# Patient Record
Sex: Female | Born: 1950
Health system: Southern US, Community
[De-identification: ages and names within clinical notes are randomized; demographics above are authoritative.]

## PROBLEM LIST (undated history)

## (undated) DIAGNOSIS — K7400 Hepatic fibrosis, unspecified: Secondary | ICD-10-CM

## (undated) DIAGNOSIS — H269 Unspecified cataract: Secondary | ICD-10-CM

## (undated) DIAGNOSIS — F419 Anxiety disorder, unspecified: Secondary | ICD-10-CM

## (undated) DIAGNOSIS — T7840XA Allergy, unspecified, initial encounter: Secondary | ICD-10-CM

## (undated) DIAGNOSIS — E785 Hyperlipidemia, unspecified: Secondary | ICD-10-CM

## (undated) DIAGNOSIS — M8580 Other specified disorders of bone density and structure, unspecified site: Secondary | ICD-10-CM

## (undated) DIAGNOSIS — I471 Supraventricular tachycardia, unspecified: Secondary | ICD-10-CM

## (undated) DIAGNOSIS — M1990 Unspecified osteoarthritis, unspecified site: Secondary | ICD-10-CM

## (undated) DIAGNOSIS — K219 Gastro-esophageal reflux disease without esophagitis: Secondary | ICD-10-CM

## (undated) DIAGNOSIS — C4491 Basal cell carcinoma of skin, unspecified: Secondary | ICD-10-CM

## (undated) DIAGNOSIS — L719 Rosacea, unspecified: Secondary | ICD-10-CM

## (undated) DIAGNOSIS — Z8619 Personal history of other infectious and parasitic diseases: Secondary | ICD-10-CM

## (undated) DIAGNOSIS — E079 Disorder of thyroid, unspecified: Secondary | ICD-10-CM

## (undated) DIAGNOSIS — R748 Abnormal levels of other serum enzymes: Secondary | ICD-10-CM

## (undated) HISTORY — DX: Unspecified cataract: H26.9

## (undated) HISTORY — DX: Supraventricular tachycardia: I47.1

## (undated) HISTORY — DX: Other specified disorders of bone density and structure, unspecified site: M85.80

## (undated) HISTORY — PX: WISDOM TOOTH EXTRACTION: SHX21

## (undated) HISTORY — DX: Supraventricular tachycardia, unspecified: I47.10

## (undated) HISTORY — DX: Unspecified osteoarthritis, unspecified site: M19.90

## (undated) HISTORY — PX: TUBAL LIGATION: SHX77

## (undated) HISTORY — DX: Disorder of thyroid, unspecified: E07.9

## (undated) HISTORY — DX: Hyperlipidemia, unspecified: E78.5

## (undated) HISTORY — DX: Personal history of other infectious and parasitic diseases: Z86.19

## (undated) HISTORY — PX: OTHER SURGICAL HISTORY: SHX169

## (undated) HISTORY — DX: Rosacea, unspecified: L71.9

## (undated) HISTORY — DX: Allergy, unspecified, initial encounter: T78.40XA

## (undated) HISTORY — PX: COLONOSCOPY: SHX174

## (undated) HISTORY — DX: Gastro-esophageal reflux disease without esophagitis: K21.9

## (undated) HISTORY — DX: Hepatic fibrosis, unspecified: K74.00

## (undated) HISTORY — DX: Abnormal levels of other serum enzymes: R74.8

---
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
## 1998-04-17 ENCOUNTER — Other Ambulatory Visit: Admission: RE | Admit: 1998-04-17 | Discharge: 1998-04-17 | Payer: Self-pay | Admitting: Obstetrics & Gynecology

## 2000-04-30 ENCOUNTER — Other Ambulatory Visit: Admission: RE | Admit: 2000-04-30 | Discharge: 2000-04-30 | Payer: Self-pay | Admitting: Obstetrics and Gynecology

## 2001-04-30 ENCOUNTER — Other Ambulatory Visit: Admission: RE | Admit: 2001-04-30 | Discharge: 2001-04-30 | Payer: Self-pay | Admitting: Obstetrics and Gynecology

## 2003-06-26 ENCOUNTER — Encounter: Admission: RE | Admit: 2003-06-26 | Discharge: 2003-07-13 | Payer: Self-pay | Admitting: Orthopedic Surgery

## 2005-08-17 ENCOUNTER — Encounter: Admission: RE | Admit: 2005-08-17 | Discharge: 2005-08-17 | Payer: Self-pay | Admitting: Neurology

## 2011-06-27 ENCOUNTER — Encounter: Payer: Self-pay | Admitting: Gastroenterology

## 2011-07-07 ENCOUNTER — Ambulatory Visit (AMBULATORY_SURGERY_CENTER): Payer: BC Managed Care – PPO | Admitting: *Deleted

## 2011-07-07 ENCOUNTER — Encounter: Payer: Self-pay | Admitting: Gastroenterology

## 2011-07-07 VITALS — Ht 66.0 in | Wt 134.7 lb

## 2011-07-07 DIAGNOSIS — Z1211 Encounter for screening for malignant neoplasm of colon: Secondary | ICD-10-CM

## 2011-07-07 MED ORDER — PEG-KCL-NACL-NASULF-NA ASC-C 100 G PO SOLR: 1.0000 | Freq: Once | ORAL | Status: DC

## 2011-07-21 ENCOUNTER — Ambulatory Visit (AMBULATORY_SURGERY_CENTER): Payer: BC Managed Care – PPO | Admitting: Gastroenterology

## 2011-07-21 ENCOUNTER — Encounter: Payer: Self-pay | Admitting: Gastroenterology

## 2011-07-21 VITALS — BP 108/70 | HR 62 | Temp 97.7°F | Resp 18 | Ht 66.0 in | Wt 134.0 lb

## 2011-07-21 DIAGNOSIS — D126 Benign neoplasm of colon, unspecified: Secondary | ICD-10-CM

## 2011-07-21 DIAGNOSIS — Z1211 Encounter for screening for malignant neoplasm of colon: Secondary | ICD-10-CM

## 2011-07-21 MED ORDER — SODIUM CHLORIDE 0.9 % IV SOLN: 500.0000 mL | INTRAVENOUS | Status: DC

## 2011-07-21 NOTE — Progress Notes (Signed)
Patient states she forgot to tell Preadmission nurse that she has had left lower quadrant pain x 2 months. Patient stating she went to GYN and the physician said it was not he ovaries as the patient had thought. Patient reports a history of this type of pain once before 2 years ago x 2 months. Patient states more like a "tiny little contraction." and it has awoken her before.Duration usually 2 minutes to 2 hours.  Patient requested IV site be anti cubital.

## 2011-07-21 NOTE — Progress Notes (Signed)
Difficulity reaching the cecum.  Pt turned to her back and abdominal pressure by the tech.  Medications were titrated per MD's orders.  Cecum was reached.  Pt had periods that she grimaced but not very uncomfortable while the scope was advanced.  She slept on the way out comfortably. Maw  Hung 2nd ba of 0.9% NS at 10:56. Lenice Llamas

## 2011-07-21 NOTE — Patient Instructions (Signed)
PLEASE FOLLOW DISCHARGE INSTRUCTIONS GIVEN TODAY. RESUME CURRENT MEDICATIONS TODAY. COLON POLYP X1 REMOVED AND SENT TO LAB. YOU WILL RECEIVE RESULT LETTER IN YOUR MAIL IN 1-2 WEEKS. CALL us WITH ANY QUESTIONS OR CONCERNS. THANK YOU!  Polyps, Colon  A polyp is extra tissue that grows inside your body. Colon polyps grow in the large intestine. The large intestine, also called the colon, is part of your digestive system. It is a long, hollow tube at the end of your digestive tract where your body makes and stores stool. Most polyps are not dangerous. They are benign. This means they are not cancerous. But over time, some types of polyps can turn into cancer. Polyps that are smaller than a pea are usually not harmful. But larger polyps could someday become or may already be cancerous. To be safe, doctors remove all polyps and test them.  WHO GETS POLYPS? Anyone can get polyps, but certain people are more likely than others. You may have a greater chance of getting polyps if:  You are over 50.   You have had polyps before.   Someone in your family has had polyps.   Someone in your family has had cancer of the large intestine.   Find out if someone in your family has had polyps. You may also be more likely to get polyps if you:   Eat a lot of fatty foods   Smoke   Drink alcohol   Do not exercise  Eat too much  SYMPTOMS Most small polyps do not cause symptoms. People often do not know they have one until their caregiver finds it during a regular checkup or while testing them for something else. Some people do have symptoms like these:  Bleeding from the anus. You might notice blood on your underwear or on toilet paper after you have had a bowel movement.   Constipation or diarrhea that lasts more than a week.   Blood in the stool. Blood can make stool look black or it can show up as red streaks in the stool.  If you have any of these symptoms, see your caregiver. HOW DOES THE DOCTOR TEST  FOR POLYPS? The doctor can use four tests to check for polyps:  Digital rectal exam. The caregiver wears gloves and checks your rectum (the last part of the large intestine) to see if it feels normal. This test would find polyps only in the rectum. Your caregiver may need to do one of the other tests listed below to find polyps higher up in the intestine.   Barium enema. The caregiver puts a liquid called barium into your rectum before taking x-rays of your large intestine. Barium makes your intestine look white in the pictures. Polyps are dark, so they are easy to see.   Sigmoidoscopy. With this test, the caregiver can see inside your large intestine. A thin flexible tube is placed into your rectum. The device is called a sigmoidoscope, which has a light and a tiny video camera in it. The caregiver uses the sigmoidoscope to look at the last third of your large intestine.   Colonoscopy. This test is like sigmoidoscopy, but the caregiver looks at all of the large intestine. It usually requires sedation. This is the most common method for finding and removing polyps.  TREATMENT  The caregiver will remove the polyp during sigmoidoscopy or colonoscopy. The polyp is then tested for cancer.   If you have had polyps, your caregiver may want you to get tested regularly  in the future.  PREVENTION There is not one sure way to prevent polyps. You might be able to lower your risk of getting them if you:  Eat more fruits and vegetables and less fatty food.   Do not smoke.   Avoid alcohol.   Exercise every day.   Lose weight if you are overweight.   Eating more calcium and folate can also lower your risk of getting polyps. Some foods that are rich in calcium are milk, cheese, and broccoli. Some foods that are rich in folate are chickpeas, kidney beans, and spinach.   Aspirin might help prevent polyps. Studies are under way.  Document Released: 06/25/2004 Document Re-Released: 03/19/2010 [facility]  Patient Information 2011 [place], [place].

## 2011-07-22 ENCOUNTER — Telehealth: Payer: Self-pay | Admitting: *Deleted

## 2011-07-22 NOTE — Telephone Encounter (Signed)
Message left for patient

## 2013-01-18 ENCOUNTER — Telehealth: Payer: Self-pay | Admitting: Nurse Practitioner

## 2013-01-19 NOTE — Telephone Encounter (Signed)
Spoke with patient needs to be seen middle of may

## 2013-02-03 ENCOUNTER — Ambulatory Visit (INDEPENDENT_AMBULATORY_CARE_PROVIDER_SITE_OTHER): Payer: BC Managed Care – PPO | Admitting: Nurse Practitioner

## 2013-02-03 VITALS — BP 128/70 | HR 76 | Temp 97.4°F | Ht 66.0 in | Wt 143.0 lb

## 2013-02-03 DIAGNOSIS — L719 Rosacea, unspecified: Secondary | ICD-10-CM

## 2013-02-03 DIAGNOSIS — E039 Hypothyroidism, unspecified: Secondary | ICD-10-CM

## 2013-02-03 DIAGNOSIS — M899 Disorder of bone, unspecified: Secondary | ICD-10-CM

## 2013-02-03 DIAGNOSIS — E785 Hyperlipidemia, unspecified: Secondary | ICD-10-CM

## 2013-02-03 DIAGNOSIS — J309 Allergic rhinitis, unspecified: Secondary | ICD-10-CM

## 2013-02-03 DIAGNOSIS — B009 Herpesviral infection, unspecified: Secondary | ICD-10-CM

## 2013-02-03 DIAGNOSIS — M8580 Other specified disorders of bone density and structure, unspecified site: Secondary | ICD-10-CM | POA: Insufficient documentation

## 2013-02-03 DIAGNOSIS — L989 Disorder of the skin and subcutaneous tissue, unspecified: Secondary | ICD-10-CM

## 2013-02-03 DIAGNOSIS — M8589 Other specified disorders of bone density and structure, multiple sites: Secondary | ICD-10-CM | POA: Insufficient documentation

## 2013-02-03 MED ORDER — METHYLPREDNISOLONE ACETATE 80 MG/ML IJ SUSP
80.0000 mg | Freq: Once | INTRAMUSCULAR | Status: AC
  Administered 2013-02-03: 80 mg via INTRAMUSCULAR

## 2013-02-03 NOTE — Patient Instructions (Signed)
Allergic Rhinitis  Allergic rhinitis is when the mucous membranes in the nose respond to allergens. Allergens are particles in the air that cause your body to have an allergic reaction. This causes you to release allergic antibodies. Through a chain of events, these eventually cause you to release histamine into the blood stream (hence the use of antihistamines). Although meant to be protective to the body, it is this release that causes your discomfort, such as frequent sneezing, congestion and an itchy runny nose.    CAUSES    The pollen allergens may come from grasses, trees, and weeds. This is seasonal allergic rhinitis, or "hay fever." Other allergens cause year-round allergic rhinitis (perennial allergic rhinitis) such as house dust mite allergen, pet dander and mold spores.    SYMPTOMS     Nasal stuffiness (congestion).   Runny, itchy nose with sneezing and tearing of the eyes.   There is often an itching of the mouth, eyes and ears.  It cannot be cured, but it can be controlled with medications.  DIAGNOSIS    If you are unable to determine the offending allergen, skin or blood testing may find it.  TREATMENT     Avoid the allergen.   Medications and allergy shots (immunotherapy) can help.   Hay fever may often be treated with antihistamines in pill or nasal spray forms. Antihistamines block the effects of histamine. There are over-the-counter medicines that may help with nasal congestion and swelling around the eyes. Check with your caregiver before taking or giving this medicine.  If the treatment above does not work, there are many new medications your caregiver can prescribe. Stronger medications may be used if initial measures are ineffective. Desensitizing injections can be used if medications and avoidance fails. Desensitization is when a patient is given ongoing shots until the body becomes less sensitive to the allergen. Make sure you follow up with your caregiver if problems continue.   SEEK MEDICAL CARE IF:     You develop fever (more than 100.5 F (38.1 C).   You develop a cough that does not stop easily (persistent).   You have shortness of breath.   You start wheezing.   Symptoms interfere with normal daily activities.  Document Released: 06/24/2001 Document Revised: 12/22/2011 Document Reviewed: 01/03/2009  [employer] Patient Information 2013 [employer].

## 2013-02-03 NOTE — Progress Notes (Signed)
  Subjective:    Patient ID: [name] [name], female    DOB: 1951-09-15, 62 y.o.   MRN: [id number]  Hyperlipidemia This is a chronic problem. The current episode started more than 1 year ago. The problem is controlled. Recent lipid tests were reviewed and are normal. She has no history of diabetes. There are no known factors aggravating her hyperlipidemia. Pertinent negatives include no focal sensory loss, focal weakness, leg pain, myalgias or shortness of breath. Current antihyperlipidemic treatment includes statins. The current treatment provides significant improvement of lipids. Risk factors for coronary artery disease include post-menopausal.  Hypothyroidism Chronic problem. Patient doing well. No c/o fatigue.  -Patient in complaining of Congestion and PND . Started 6day. Has gotten worse since started. Associated symptoms include Fatigue, cough and headache. He has tried mucinex d OTC without relief. - Skin lesion right forearm- popped up 1 week ago. Has increased in size    Review of Systems  Constitutional: Positive for fever. Negative for chills and appetite change.  HENT: Positive for congestion, rhinorrhea, postnasal drip and sinus pressure. Negative for ear pain.   Eyes: Negative.   Respiratory: Positive for cough (nonproductive). Negative for shortness of breath.   Cardiovascular: Negative.   Musculoskeletal: Negative for myalgias.  Neurological: Negative for focal weakness.       Objective:   Physical Exam  Constitutional: She is oriented to person, place, and time. She appears well-developed and well-nourished.  HENT:  Right Ear: Hearing, tympanic membrane, external ear and ear canal normal.  Left Ear: Hearing, tympanic membrane, external ear and ear canal normal.  Nose: Mucosal edema and rhinorrhea present. Right sinus exhibits frontal sinus tenderness (mild). Right sinus exhibits no maxillary sinus tenderness. Left sinus exhibits frontal sinus tenderness (mild). Left sinus  exhibits no maxillary sinus tenderness.  Mouth/Throat: Posterior oropharyngeal erythema present.  Eyes: Pupils are equal, round, and reactive to light.  Neck: Normal range of motion. Neck supple. No JVD present. Carotid bruit is not present.  Cardiovascular: Normal rate, normal heart sounds and intact distal pulses.   Pulmonary/Chest: Effort normal and breath sounds normal.  Abdominal: Soft. Bowel sounds are normal. She exhibits no mass. There is no tenderness. There is no rebound and no guarding.  Neurological: She is alert and oriented to person, place, and time.  Skin: Skin is warm and dry.  2cm annular lesion, erythematous and scaly right forearm.  Psychiatric: She has a normal mood and affect. Her behavior is normal. Judgment and thought content normal.   BP 128/70  Pulse 76  Temp(Src) 97.4 F (36.3 C) (Oral)  Ht 5\' 6"  (1.676 m)  Wt 143 lb (64.864 kg)  BMI 23.09 kg/m2        Assessment & Plan:  1. Allergic rhinitis Nasonex 2 sprays each nostril QD-sample Norel AD 1 po Q6 prn-samples - methylPREDNISolone acetate (DEPO-MEDROL) injection 80 mg; Inject 1 mL (80 mg total) into the muscle once.  2. Unspecified hypothyroidism  - Thyroid Panel With TSH  3. Hyperlipidemia Low fat diet and exercise - COMPLETE METABOLIC PANEL WITH GFR - NMR Lipoprofile with Lipids   4. Benign skin lesion of forearm Do not pick at lesion - Ambulatory referral to Dermatology Mary-Margaret Daphine Deutscher, FNP

## 2013-02-04 ENCOUNTER — Other Ambulatory Visit: Payer: Self-pay | Admitting: Nurse Practitioner

## 2013-02-04 LAB — NMR LIPOPROFILE WITH LIPIDS
Cholesterol, Total: 113 mg/dL (ref <200)
HDL Particle Number: 29.6 umol/L — ABNORMAL LOW (ref >=30.5)
HDL Size: 8.5 nm — ABNORMAL LOW (ref >=9.2)
HDL-C: 40 mg/dL (ref >=40)
LDL (calc): 54 mg/dL (ref <100)
LDL Particle Number: 837 nmol/L (ref <1000)
LDL Size: 20.3 nm — ABNORMAL LOW (ref >20.5)
LP-IR Score: 56 — ABNORMAL HIGH (ref <=45)
Large HDL-P: <1.3 umol/L — ABNORMAL LOW (ref >=4.8)
Large VLDL-P: 0.8 nmol/L (ref <=2.7)
Small LDL Particle Number: 444 nmol/L (ref <=527)
Triglycerides: 94 mg/dL (ref <150)
VLDL Size: 46.8 nm — ABNORMAL HIGH (ref <=46.6)

## 2013-02-04 LAB — COMPLETE METABOLIC PANEL WITH GFR
ALT: 21 U/L (ref 0–35)
AST: 20 U/L (ref 0–37)
Albumin: 3.9 g/dL (ref 3.5–5.2)
Alkaline Phosphatase: 68 U/L (ref 39–117)
BUN: 14 mg/dL (ref 6–23)
CO2: 28 mEq/L (ref 19–32)
Calcium: 9.2 mg/dL (ref 8.4–10.5)
Chloride: 102 mEq/L (ref 96–112)
Creat: 0.64 mg/dL (ref 0.50–1.10)
GFR, Est African American: >89 mL/min
GFR, Est Non African American: >89 mL/min
Glucose, Bld: 92 mg/dL (ref 70–99)
Potassium: 4.5 mEq/L (ref 3.5–5.3)
Sodium: 138 mEq/L (ref 135–145)
Total Bilirubin: 0.5 mg/dL (ref 0.3–1.2)
Total Protein: 6.6 g/dL (ref 6.0–8.3)

## 2013-02-04 LAB — THYROID PANEL WITH TSH
Free Thyroxine Index: 5.1 — ABNORMAL HIGH (ref 1.0–3.9)
T3 Uptake: 34.3 % (ref 22.5–37.0)
T4, Total: 14.8 ug/dL — ABNORMAL HIGH (ref 5.0–12.5)
TSH: 0.166 u[IU]/mL — ABNORMAL LOW (ref 0.350–4.500)

## 2013-02-04 MED ORDER — LEVOTHYROXINE SODIUM 50 MCG PO TABS: 50.0000 ug | ORAL_TABLET | Freq: Every day | ORAL | Status: DC

## 2013-02-07 ENCOUNTER — Telehealth: Payer: Self-pay | Admitting: Nurse Practitioner

## 2013-02-07 NOTE — Telephone Encounter (Signed)
Patient aware.

## 2013-03-01 ENCOUNTER — Other Ambulatory Visit: Payer: Self-pay

## 2013-03-01 ENCOUNTER — Ambulatory Visit: Payer: Self-pay | Admitting: Nurse Practitioner

## 2013-03-21 ENCOUNTER — Other Ambulatory Visit (INDEPENDENT_AMBULATORY_CARE_PROVIDER_SITE_OTHER): Payer: BC Managed Care – PPO

## 2013-03-21 DIAGNOSIS — E039 Hypothyroidism, unspecified: Secondary | ICD-10-CM

## 2013-03-21 LAB — THYROID PANEL WITH TSH
Free Thyroxine Index: 2.8 (ref 1.0–3.9)
T3 Uptake: 30.1 % (ref 22.5–37.0)
TSH: 12.966 u[IU]/mL — ABNORMAL HIGH (ref 0.350–4.500)

## 2013-03-22 ENCOUNTER — Other Ambulatory Visit: Payer: Self-pay | Admitting: Nurse Practitioner

## 2013-03-22 ENCOUNTER — Telehealth: Payer: Self-pay | Admitting: Nurse Practitioner

## 2013-03-22 DIAGNOSIS — E039 Hypothyroidism, unspecified: Secondary | ICD-10-CM

## 2013-03-22 MED ORDER — LEVOTHYROXINE SODIUM 88 MCG PO TABS: 88.0000 ug | ORAL_TABLET | Freq: Every day | ORAL | Status: DC

## 2013-03-22 NOTE — Progress Notes (Signed)
Pt aware of labs  

## 2013-03-22 NOTE — Telephone Encounter (Signed)
Pt aware of labs  

## 2013-03-24 ENCOUNTER — Other Ambulatory Visit: Payer: Self-pay | Admitting: Nurse Practitioner

## 2013-05-05 ENCOUNTER — Telehealth: Payer: Self-pay | Admitting: *Deleted

## 2013-05-05 ENCOUNTER — Other Ambulatory Visit (INDEPENDENT_AMBULATORY_CARE_PROVIDER_SITE_OTHER): Payer: BC Managed Care – PPO

## 2013-05-05 DIAGNOSIS — E039 Hypothyroidism, unspecified: Secondary | ICD-10-CM

## 2013-05-05 NOTE — Telephone Encounter (Signed)
Pt came by today and is concerned about her thyroid. She says that [employer] can now get levoxyl - but it will need to be ordered. She had labs today for rck thyroid. When we know what strength we need to do, please [facility] and d/c the levothyroxine! On 88 as of today. She liked the levoxyl better and it seemed to work better!

## 2013-05-06 LAB — THYROID PANEL WITH TSH
Free Thyroxine Index: 5.6 — ABNORMAL HIGH (ref 1.0–3.9)
T4, Total: 16.1 ug/dL — ABNORMAL HIGH (ref 5.0–12.5)
TSH: 0.396 u[IU]/mL (ref 0.350–4.500)

## 2013-05-08 ENCOUNTER — Other Ambulatory Visit: Payer: Self-pay | Admitting: Nurse Practitioner

## 2013-05-08 MED ORDER — LEVOTHYROXINE SODIUM 88 MCG PO TABS: 88.0000 ug | ORAL_TABLET | Freq: Every day | ORAL | Status: DC

## 2013-05-08 NOTE — Telephone Encounter (Signed)
Ok levothyroxine to levoxyl

## 2013-05-09 ENCOUNTER — Other Ambulatory Visit: Payer: Self-pay

## 2013-05-09 NOTE — Telephone Encounter (Signed)
Left message on machine that meds sent to pharmacy 

## 2013-05-27 ENCOUNTER — Ambulatory Visit (INDEPENDENT_AMBULATORY_CARE_PROVIDER_SITE_OTHER): Payer: BC Managed Care – PPO | Admitting: Nurse Practitioner

## 2013-05-27 ENCOUNTER — Encounter: Payer: Self-pay | Admitting: Nurse Practitioner

## 2013-05-27 VITALS — BP 116/75 | HR 87 | Temp 98.5°F | Ht 65.0 in | Wt 140.5 lb

## 2013-05-27 DIAGNOSIS — E039 Hypothyroidism, unspecified: Secondary | ICD-10-CM

## 2013-05-27 DIAGNOSIS — E785 Hyperlipidemia, unspecified: Secondary | ICD-10-CM

## 2013-05-27 DIAGNOSIS — L293 Anogenital pruritus, unspecified: Secondary | ICD-10-CM

## 2013-05-27 DIAGNOSIS — N898 Other specified noninflammatory disorders of vagina: Secondary | ICD-10-CM

## 2013-05-27 MED ORDER — TRIAMCINOLONE 0.1 % CREAM:EUCERIN CREAM 1:1: 1.0000 "application " | TOPICAL_CREAM | Freq: Two times a day (BID) | CUTANEOUS | Status: DC | PRN

## 2013-05-27 MED ORDER — ATORVASTATIN CALCIUM 40 MG PO TABS: 40.0000 mg | ORAL_TABLET | Freq: Every day | ORAL | Status: DC

## 2013-05-27 MED ORDER — LEVOTHYROXINE SODIUM 88 MCG PO TABS: 88.0000 ug | ORAL_TABLET | Freq: Every day | ORAL | Status: DC

## 2013-05-27 MED ORDER — LEVOXYL 88 MCG PO TABS: 88.0000 ug | ORAL_TABLET | Freq: Every day | ORAL | Status: DC

## 2013-05-27 NOTE — Patient Instructions (Signed)
Herpes Simplex Herpes simplex is generally classified as Type 1 or Type 2. Type 1 is generally the type that is responsible for cold sores. Type 2 is generally associated with sexually transmitted diseases. We now know that most of the thoughts on these viruses are inaccurate. We find that HSV1 is also present genitally and HSV2 can be present orally, but this will vary in different locations of the world. Herpes simplex is usually detected by doing a culture. Blood tests are also available for this virus; however, the accuracy is often not as good.  PREPARATION FOR TEST No preparation or fasting is necessary. NORMAL FINDINGS  No virus present  No HSV antigens or antibodies present Ranges for normal findings may vary among different laboratories and hospitals. You should always check with your doctor after having lab work or other tests done to discuss the meaning of your test results and whether your values are considered within normal limits. MEANING OF TEST  Your caregiver will go over the test results with you and discuss the importance and meaning of your results, as well as treatment options and the need for additional tests if necessary. OBTAINING THE TEST RESULTS  It is your responsibility to obtain your test results. Ask the lab or department performing the test when and how you will get your results. Document Released: 11/01/2004 Document Revised: 12/22/2011 Document Reviewed: 09/09/2008 [employer] Patient Information 2014 [employer].  

## 2013-05-27 NOTE — Progress Notes (Signed)
Subjective:    Patient ID: [name] [name], female    DOB: 1951/10/11, 62 y.o.   MRN: [id number]  Vaginal Itching The patient's primary symptoms include genital lesions, a genital odor, a genital rash and pelvic pain. The patient's pertinent negatives include no genital itching. This is a recurrent problem. The current episode started more than 1 year ago. The problem occurs intermittently. The problem has been waxing and waning. The pain is mild. The problem affects both sides. She is not pregnant. Pertinent negatives include no constipation or diarrhea. There has been no bleeding. She has not been passing clots. She has not been passing tissue. Nothing aggravates the symptoms. Treatments tried: acyclovir  The treatment provided no relief. She is sexually active. No, her partner does not have an STD. She uses nothing for contraception. She is postmenopausal.  Thyroid Problem Visit type: hypothyroidism. Patient reports no anxiety, constipation, depressed mood, diaphoresis, diarrhea, dry skin, hair loss, heat intolerance, hoarse voice, menstrual problem, nail problem, palpitations, tremors, visual change, weight gain or weight loss. The symptoms have been stable. Her past medical history is significant for hyperlipidemia. There is no history of diabetes.  Hyperlipidemia This is a chronic problem. The current episode started more than 1 year ago. The problem is controlled. Recent lipid tests were reviewed and are normal. Exacerbating diseases include hypothyroidism. She has no history of diabetes, obesity or nephrotic syndrome. There are no known factors aggravating her hyperlipidemia. Pertinent negatives include no focal sensory loss, focal weakness, leg pain, myalgias or shortness of breath. Current antihyperlipidemic treatment includes statins. The current treatment provides moderate improvement of lipids. Risk factors for coronary artery disease include family history.      Review of Systems   Constitutional: Negative for weight loss, weight gain and diaphoresis.  HENT: Negative for hoarse voice.   Respiratory: Negative for shortness of breath.   Cardiovascular: Negative for palpitations.  Gastrointestinal: Negative for diarrhea and constipation.  Endocrine: Negative for heat intolerance.  Genitourinary: Positive for pelvic pain. Negative for menstrual problem.  Musculoskeletal: Negative for myalgias.  Neurological: Negative for tremors and focal weakness.  All other systems reviewed and are negative.       Objective:   Physical Exam  Constitutional: She is oriented to person, place, and time. She appears well-developed and well-nourished.  HENT:  Nose: Nose normal.  Mouth/Throat: Oropharynx is clear and moist.  Eyes: EOM are normal.  Neck: Trachea normal, normal range of motion and full passive range of motion without pain. Neck supple. No JVD present. Carotid bruit is not present. No thyromegaly present.  Cardiovascular: Normal rate, regular rhythm, normal heart sounds and intact distal pulses.  Exam reveals no gallop and no friction rub.   No murmur heard. Pulmonary/Chest: Effort normal and breath sounds normal.  Abdominal: Soft. Bowel sounds are normal. She exhibits no distension and no mass. There is no tenderness.  Musculoskeletal: Normal range of motion.  Lymphadenopathy:    She has no cervical adenopathy.  Neurological: She is alert and oriented to person, place, and time. She has normal reflexes.  Skin: Skin is warm and dry.  Psychiatric: She has a normal mood and affect. Her behavior is normal. Judgment and thought content normal.    BP 116/75  Pulse 87  Temp(Src) 98.5 F (36.9 C) (Oral)  Ht 5\' 5"  (1.651 m)  Wt 140 lb 8 oz (63.73 kg)  BMI 23.38 kg/m2       Assessment & Plan:  1. Hypothyroidism Changed from levothyroxine to levoxyl  per patient request -  (LEVOXYL) 88 MCG tablet; Take 1 tablet (88 mcg total) by mouth daily before breakfast.   Dispense: 30 tablet; Refill: 5 - Thyroid Panel With TSH  2. Vaginal itching Try to avoid itching Come to office when have episode of itching so I can look at area - Triamcinolone Acetonide (TRIAMCINOLONE 0.1 % CREAM : EUCERIN) CREA; Apply 1 application topically 2 (two) times daily as needed.  Dispense: 1 each; Refill: 2  3. Hyperlipidemia Low fat diet and exercise - atorvastatin (LIPITOR) 40 MG tablet; Take 1 tablet (40 mg total) by mouth daily.  Dispense: 30 tablet; Refill: 5 - CMP14+EGFR - NMR, lipoprofile  Health maintenance reviewed  Mary-Margaret Daphine Deutscher, FNP

## 2013-05-28 LAB — NMR, LIPOPROFILE
Cholesterol: 134 mg/dL (ref <200)
LDL Particle Number: 1160 nmol/L — ABNORMAL HIGH (ref <1000)
LDL Size: 20.4 nm — ABNORMAL LOW (ref >20.5)
LP-IR Score: 52 — ABNORMAL HIGH (ref <=45)
Small LDL Particle Number: 678 nmol/L — ABNORMAL HIGH (ref <=527)
Triglycerides by NMR: 118 mg/dL (ref <150)

## 2013-05-28 LAB — CMP14+EGFR
ALT: 27 IU/L (ref 0–32)
AST: 24 IU/L (ref 0–40)
Albumin/Globulin Ratio: 2 (ref 1.1–2.5)
Albumin: 4.4 g/dL (ref 3.5–5.5)
Alkaline Phosphatase: 92 IU/L (ref 39–117)
BUN/Creatinine Ratio: 15 (ref 9–23)
BUN: 13 mg/dL (ref 6–24)
Chloride: 102 mmol/L (ref 97–108)
Creatinine, Ser: 0.84 mg/dL (ref 0.57–1.00)
GFR calc Af Amer: 88 mL/min/{1.73_m2} (ref >59)
GFR calc non Af Amer: 76 mL/min/{1.73_m2} (ref >59)
Glucose: 88 mg/dL (ref 65–99)
Sodium: 139 mmol/L (ref 134–144)
Total Bilirubin: 0.6 mg/dL (ref 0.0–1.2)
Total Protein: 6.6 g/dL (ref 6.0–8.5)

## 2013-05-28 LAB — THYROID PANEL WITH TSH
Free Thyroxine Index: 4.2 (ref 1.2–4.9)
T3 Uptake Ratio: 30 % (ref 24–39)
T4, Total: 14 ug/dL — ABNORMAL HIGH (ref 4.5–12.0)

## 2013-05-31 ENCOUNTER — Telehealth: Payer: Self-pay | Admitting: Nurse Practitioner

## 2013-05-31 NOTE — Telephone Encounter (Signed)
Patient notified of lab results. See lab note

## 2013-08-18 ENCOUNTER — Other Ambulatory Visit: Payer: Self-pay

## 2013-08-30 ENCOUNTER — Ambulatory Visit (INDEPENDENT_AMBULATORY_CARE_PROVIDER_SITE_OTHER): Payer: BC Managed Care – PPO | Admitting: General Practice

## 2013-08-30 ENCOUNTER — Other Ambulatory Visit: Payer: BC Managed Care – PPO

## 2013-08-30 VITALS — BP 110/70 | HR 96 | Temp 98.2°F | Ht 65.0 in | Wt 136.0 lb

## 2013-08-30 DIAGNOSIS — E039 Hypothyroidism, unspecified: Secondary | ICD-10-CM

## 2013-08-30 DIAGNOSIS — J069 Acute upper respiratory infection, unspecified: Secondary | ICD-10-CM

## 2013-08-30 DIAGNOSIS — R5381 Other malaise: Secondary | ICD-10-CM

## 2013-08-30 DIAGNOSIS — E785 Hyperlipidemia, unspecified: Secondary | ICD-10-CM

## 2013-08-30 DIAGNOSIS — J029 Acute pharyngitis, unspecified: Secondary | ICD-10-CM

## 2013-08-30 DIAGNOSIS — E559 Vitamin D deficiency, unspecified: Secondary | ICD-10-CM

## 2013-08-30 LAB — POCT CBC
Granulocyte percent: 77.3 %G (ref 37–80)
HCT, POC: 41 % (ref 37.7–47.9)
Hemoglobin: 13.6 g/dL (ref 12.2–16.2)
Lymph, poc: 1.5 (ref 0.6–3.4)
MCHC: 33.1 g/dL (ref 31.8–35.4)
MCV: 84.3 fL (ref 80–97)
POC Granulocyte: 7.6 — AB (ref 2–6.9)
POC LYMPH PERCENT: 15.4 %L (ref 10–50)
RBC: 4.9 M/uL (ref 4.04–5.48)
RDW, POC: 12.1 %
WBC: 9.8 10*3/uL (ref 4.6–10.2)

## 2013-08-30 LAB — POCT RAPID STREP A (OFFICE): Rapid Strep A Screen: NEGATIVE

## 2013-08-30 MED ORDER — AMOXICILLIN 500 MG PO CAPS: 500.0000 mg | ORAL_CAPSULE | Freq: Two times a day (BID) | ORAL | Status: DC

## 2013-08-30 NOTE — Patient Instructions (Signed)

## 2013-08-30 NOTE — Progress Notes (Signed)
  Subjective:    Patient ID: [name] [name], female    DOB: 18-Sep-1951, 62 y.o.   MRN: [id number]  Sore Throat  This is a new problem. The current episode started in the past 7 days. The problem has been unchanged. Neither side of throat is experiencing more pain than the other. There has been no fever. The pain is at a severity of 2/10. The pain is mild. Associated symptoms include congestion, coughing and a plugged ear sensation. Pertinent negatives include no abdominal pain, diarrhea, ear pain, headaches, shortness of breath or vomiting. She has had no exposure to strep or mono. She has tried NSAIDs for the symptoms. The treatment provided no relief.  Cough This is a new problem. The current episode started in the past 7 days. The problem has been unchanged. The cough is productive of sputum (brown, greenish sputum). Associated symptoms include a sore throat. Pertinent negatives include no chest pain, chills, ear pain, fever, headaches, nasal congestion, postnasal drip, shortness of breath or wheezing. Nothing aggravates the symptoms. She has tried nothing for the symptoms. There is no history of asthma, bronchitis, emphysema or pneumonia.      Review of Systems  Constitutional: Negative for fever and chills.  HENT: Positive for congestion and sore throat. Negative for ear pain, postnasal drip and sinus pressure.   Respiratory: Positive for cough. Negative for shortness of breath and wheezing.   Cardiovascular: Negative for chest pain and palpitations.  Gastrointestinal: Negative for vomiting, abdominal pain and diarrhea.  Neurological: Negative for dizziness, weakness and headaches.       Objective:   Physical Exam  Constitutional: She is oriented to person, place, and time. She appears well-developed and well-nourished.  HENT:  Head: Normocephalic and atraumatic.  Right Ear: External ear normal.  Left Ear: External ear normal.  Eyes: EOM are normal. Pupils are equal, round, and  reactive to light.  Neck: Normal range of motion. Neck supple. No thyromegaly present.  Cardiovascular: Normal rate, regular rhythm and normal heart sounds.   Pulmonary/Chest: Effort normal and breath sounds normal.  Lymphadenopathy:    She has no cervical adenopathy.  Neurological: She is alert and oriented to person, place, and time.  Skin: Skin is warm and dry.  Psychiatric: She has a normal mood and affect.          Assessment & Plan:  1. Sore throat  - POCT rapid strep A  2. Upper respiratory infection  - amoxicillin (AMOXIL) 500 MG capsule; Take 1 capsule (500 mg total) by mouth 2 (two) times daily.  Dispense: 20 capsule; Refill: 0 -increased fluids -RTO if symptoms worsen or unresolved -Patient verbalized understanding -Coralie Keens, FNP-C

## 2013-09-01 ENCOUNTER — Other Ambulatory Visit: Payer: Self-pay | Admitting: Nurse Practitioner

## 2013-09-01 LAB — NMR, LIPOPROFILE
HDL Particle Number: 33.5 umol/L (ref >=30.5)
LDL Particle Number: 846 nmol/L (ref <1000)
LDL Size: 20.4 nm — ABNORMAL LOW (ref >20.5)
LP-IR Score: 47 — ABNORMAL HIGH (ref <=45)
Triglycerides by NMR: 97 mg/dL (ref <150)

## 2013-09-01 LAB — CMP14+EGFR
ALT: 25 IU/L (ref 0–32)
AST: 16 IU/L (ref 0–40)
Albumin/Globulin Ratio: 1.7 (ref 1.1–2.5)
Albumin: 4 g/dL (ref 3.5–5.5)
Alkaline Phosphatase: 91 IU/L (ref 39–117)
BUN/Creatinine Ratio: 20 (ref 9–23)
BUN: 16 mg/dL (ref 6–24)
Calcium: 9.6 mg/dL (ref 8.7–10.2)
Chloride: 100 mmol/L (ref 97–108)
Creatinine, Ser: 0.82 mg/dL (ref 0.57–1.00)
GFR calc Af Amer: 91 mL/min/{1.73_m2} (ref >59)
Potassium: 4.1 mmol/L (ref 3.5–5.2)
Total Bilirubin: 0.6 mg/dL (ref 0.0–1.2)

## 2013-09-01 LAB — THYROID PANEL WITH TSH
Free Thyroxine Index: 3.7 (ref 1.2–4.9)
T4, Total: 12.7 ug/dL — ABNORMAL HIGH (ref 4.5–12.0)

## 2013-09-01 LAB — VITAMIN D 25 HYDROXY (VIT D DEFICIENCY, FRACTURES): Vit D, 25-Hydroxy: 23.6 ng/mL — ABNORMAL LOW (ref 30.0–100.0)

## 2013-09-01 MED ORDER — LEVOTHYROXINE SODIUM 75 MCG PO TABS: 75.0000 ug | ORAL_TABLET | Freq: Every day | ORAL | Status: DC

## 2013-10-18 ENCOUNTER — Other Ambulatory Visit (INDEPENDENT_AMBULATORY_CARE_PROVIDER_SITE_OTHER): Payer: BC Managed Care – PPO

## 2013-10-18 DIAGNOSIS — E039 Hypothyroidism, unspecified: Secondary | ICD-10-CM

## 2013-10-19 LAB — THYROID PANEL WITH TSH
FREE THYROXINE INDEX: 2.9 (ref 1.2–4.9)
T3 Uptake Ratio: 28 % (ref 24–39)
T4 TOTAL: 10.4 ug/dL (ref 4.5–12.0)
TSH: 1.21 u[IU]/mL (ref 0.450–4.500)

## 2013-11-25 ENCOUNTER — Ambulatory Visit (INDEPENDENT_AMBULATORY_CARE_PROVIDER_SITE_OTHER): Payer: BC Managed Care – PPO | Admitting: Family Medicine

## 2013-11-25 VITALS — BP 99/68 | HR 91 | Temp 98.5°F | Ht 65.0 in | Wt 137.8 lb

## 2013-11-25 DIAGNOSIS — R509 Fever, unspecified: Secondary | ICD-10-CM | POA: Insufficient documentation

## 2013-11-25 DIAGNOSIS — J111 Influenza due to unidentified influenza virus with other respiratory manifestations: Secondary | ICD-10-CM | POA: Insufficient documentation

## 2013-11-25 LAB — POCT RAPID STREP A (OFFICE): Rapid Strep A Screen: NEGATIVE

## 2013-11-25 LAB — POCT INFLUENZA A/B
Influenza A, POC: POSITIVE
Influenza B, POC: NEGATIVE

## 2013-11-25 MED ORDER — OSELTAMIVIR PHOSPHATE 75 MG PO CAPS: 75.0000 mg | ORAL_CAPSULE | Freq: Two times a day (BID) | ORAL | Status: DC

## 2013-11-25 NOTE — Patient Instructions (Signed)

## 2013-11-25 NOTE — Progress Notes (Signed)
Patient ID: [name] [name], female   DOB: 1951/03/13, 63 y.o.   MRN: [id number] SUBJECTIVE: CC: Chief Complaint  Patient presents with  . Fever  . head and chest congestion  . Generalized Body Aches  . Sore Throat    HPI: As above. Sore Throat Patient complains of sore throat. Associated symptoms include chills, coryza, myalgias, nasal blockage, post nasal drip, sinus and nasal congestion and sore throat. Onset of symptoms was 3 days ago, and have been gradually worsening since that time. She is drinking plenty of fluids. She has not had recent close exposure to someone with proven streptococcal pharyngitis. Nor the flu.  Past Medical History  Diagnosis Date  . Allergy   . Hyperlipidemia   . Osteopenia   . Thyroid disease     hypothyroid  . History of cold sores   . Rosacea    Past Surgical History  Procedure Laterality Date  . Wisdom tooth extraction    . Tubal ligation     History   Social History  . Marital Status: Single    Spouse Name: N/A    Number of Children: N/A  . Years of Education: N/A   Occupational History  . Not on file.   Social History Main Topics  . Smoking status: Never Smoker   . Smokeless tobacco: Not on file  . Alcohol Use: No  . Drug Use: No  . Sexual Activity: Not on file   Other Topics Concern  . Not on file   Social History Narrative  . No narrative on file   Family History  Problem Relation Age of Onset  . Colon cancer Neg Hx   . Stomach cancer Neg Hx   . Esophageal cancer Neg Hx   . Heart disease Mother   . Cancer Father     LUNG  . Stroke Father   . Hypertension Father    Current Outpatient Prescriptions on File Prior to Visit  Medication Sig Dispense Refill  . atorvastatin (LIPITOR) 40 MG tablet Take 1 tablet (40 mg total) by mouth daily.  30 tablet  5  . Cholecalciferol (VITAMIN D3) 2000 UNITS TABS Take 1 tablet by mouth daily.        Marland Kitchen levothyroxine (SYNTHROID, LEVOTHROID) 75 MCG tablet Take 1 tablet (75 mcg total) by  mouth daily.  90 tablet  1   No current facility-administered medications on file prior to visit.   No Known Allergies  There is no immunization history on file for this patient. Prior to Admission medications   Medication Sig Start Date End Date Taking? Authorizing Provider  atorvastatin (LIPITOR) 40 MG tablet Take 1 tablet (40 mg total) by mouth daily. 05/27/13  Yes Mary-Margaret Hassell Done, FNP  Cholecalciferol (VITAMIN D3) 2000 UNITS TABS Take 1 tablet by mouth daily.     Yes Historical Provider, MD  levothyroxine (SYNTHROID, LEVOTHROID) 75 MCG tablet Take 1 tablet (75 mcg total) by mouth daily. 09/01/13  Yes Mary-Margaret Hassell Done, FNP     ROS: As above in the HPI. All other systems are stable or negative.  OBJECTIVE: APPEARANCE:  Patient in no acute distress.The patient appeared well nourished and normally developed. Acyanotic. Waist: VITAL SIGNS:BP 99/68  Pulse 91  Temp(Src) 98.5 F (36.9 C) (Oral)  Ht 5\' 5"  (1.651 m)  Wt 137 lb 12.8 oz (62.506 kg)  BMI 22.93 kg/m2   SKIN: warm and  Dry without overt rashes, tattoos and scars  HEAD and Neck: without JVD, Head and scalp: normal Eyes:No scleral  icterus. Fundi normal, eye movements normal. Ears: Auricle normal, canal normal, Tympanic membranes normal, insufflation normal. Nose: normal Throat: normal Neck & thyroid: normal  CHEST & LUNGS: Chest wall: normal Lungs: Clear  CVS: Reveals the PMI to be normally located. Regular rhythm, First and Second Heart sounds are normal,  absence of murmurs, rubs or gallops. Peripheral vasculature: Radial pulses: normal Dorsal pedis pulses: normal Posterior pulses: normal  ABDOMEN:  Appearance: normal Benign, no organomegaly, no masses, no Abdominal Aortic enlargement. No Guarding , no rebound. No Bruits. Bowel sounds: normal  RECTAL: N/A GU: N/A  EXTREMETIES: nonedematous.  MUSCULOSKELETAL:  Spine: normal Joints: intact  NEUROLOGIC: oriented to time,place and  person; nonfocal. Strength is normal Sensory is normal Reflexes are normal Cranial Nerves are normal.  ASSESSMENT: Fever, unspecified - Plan: POCT rapid strep A, POCT Influenza A/B  Influenza with other respiratory manifestations  PLAN:  Orders Placed This Encounter  Procedures  . POCT rapid strep A  . POCT Influenza A/B   Results for orders placed in visit on 11/25/13  POCT RAPID STREP A (OFFICE)      Result Value Ref Range   Rapid Strep A Screen Negative  Negative  POCT INFLUENZA A/B      Result Value Ref Range   Influenza A, POC Positive     Influenza B, POC Negative     Results for orders placed in visit on 11/25/13  POCT RAPID STREP A (OFFICE)      Result Value Ref Range   Rapid Strep A Screen Negative  Negative  POCT INFLUENZA A/B      Result Value Ref Range   Influenza A, POC Positive     Influenza B, POC Negative     Handout on influenza in the AVS. Discussed vaccination extensively and her misgivings and misunderstandings about vaccines discussed and th eeffect of politicians and the news media.  Recommend the flu vaccine annually.    Meds ordered this encounter  Medications  . oseltamivir (TAMIFLU) 75 MG capsule    Sig: Take 1 capsule (75 mg total) by mouth 2 (two) times daily.    Dispense:  10 capsule    Refill:  0   Medications Discontinued During This Encounter  Medication Reason  . amoxicillin (AMOXIL) 500 MG capsule    Return if symptoms worsen or fail to improve.  Oluwatosin Higginson P. Jacelyn Grip, M.D.

## 2013-12-27 ENCOUNTER — Ambulatory Visit: Payer: BC Managed Care – PPO | Admitting: Nurse Practitioner

## 2013-12-27 ENCOUNTER — Encounter: Payer: Self-pay | Admitting: Nurse Practitioner

## 2013-12-27 ENCOUNTER — Ambulatory Visit (INDEPENDENT_AMBULATORY_CARE_PROVIDER_SITE_OTHER): Payer: BC Managed Care – PPO | Admitting: Nurse Practitioner

## 2013-12-27 VITALS — BP 118/71 | HR 91 | Temp 97.9°F | Ht 65.0 in | Wt 138.0 lb

## 2013-12-27 DIAGNOSIS — R002 Palpitations: Secondary | ICD-10-CM | POA: Insufficient documentation

## 2013-12-27 NOTE — Patient Instructions (Signed)
Palpitations   A palpitation is the feeling that your heartbeat is irregular or is faster than normal. It may feel like your heart is fluttering or skipping a beat. Palpitations are usually not a serious problem. However, in some cases, you may need further medical evaluation.  CAUSES   Palpitations can be caused by:   Smoking.   Caffeine or other stimulants, such as diet pills or energy drinks.   Alcohol.   Stress and anxiety.   Strenuous physical activity.   Fatigue.   Certain medicines.   Heart disease, especially if you have a history of arrhythmias. This includes atrial fibrillation, atrial flutter, or supraventricular tachycardia.   An improperly working pacemaker or defibrillator.  DIAGNOSIS   To find the cause of your palpitations, your caregiver will take your history and perform a physical exam. Tests may also be done, including:   Electrocardiography (ECG). This test records the heart's electrical activity.   Cardiac monitoring. This allows your caregiver to monitor your heart rate and rhythm in real time.   Holter monitor. This is a portable device that records your heartbeat and can help diagnose heart arrhythmias. It allows your caregiver to track your heart activity for several days, if needed.   Stress tests by exercise or by giving medicine that makes the heart beat faster.  TREATMENT   Treatment of palpitations depends on the cause of your symptoms and can vary greatly. Most cases of palpitations do not require any treatment other than time, relaxation, and monitoring your symptoms. Other causes, such as atrial fibrillation, atrial flutter, or supraventricular tachycardia, usually require further treatment.  HOME CARE INSTRUCTIONS    Avoid:   Caffeinated coffee, tea, soft drinks, diet pills, and energy drinks.   Chocolate.   Alcohol.   Stop smoking if you smoke.   Reduce your stress and anxiety. Things that can help you relax include:   A method that measures bodily functions so  you can learn to control them (biofeedback).   Yoga.   Meditation.   Physical activity such as swimming, jogging, or walking.   Get plenty of rest and sleep.  SEEK MEDICAL CARE IF:    You continue to have a fast or irregular heartbeat beyond 24 hours.   Your palpitations occur more often.  SEEK IMMEDIATE MEDICAL CARE IF:   You develop chest pain or shortness of breath.   You have a severe headache.   You feel dizzy, or you faint.  MAKE SURE YOU:   Understand these instructions.   Will watch your condition.   Will get help right away if you are not doing well or get worse.  Document Released: 09/26/2000 Document Revised: 01/24/2013 Document Reviewed: 11/28/2011  [employer] Patient Information 2014 [employer]

## 2013-12-27 NOTE — Progress Notes (Signed)
   Subjective:    Patient ID: [name] [name], female    DOB: 03-Oct-1951, 63 y.o.   MRN: [id number]  HPI Patient presents today complaining of palpitations over the past year. Palpitations occur at any time and occurs 1-2 times during one time period. Denies any chest pain, SOB, weakness, headaches, or cardiac structural defects.  No treatment tried. Last "flutter" felt was 1 month ago. Can go months without experiencing palpiations.  Is also complaining of lightheadedness that occurs when she is seated. Episodes last about 3 seconds. Denies dizziness. No treatment tried.   Review of Systems  Constitutional: Negative for fatigue.  HENT: Positive for ear discharge.   Respiratory: Negative for shortness of breath.   Cardiovascular: Positive for palpitations. Negative for chest pain and leg swelling.  Neurological: Positive for light-headedness. Negative for weakness.  All other systems reviewed and are negative.       Objective:   Physical Exam  Constitutional: She is oriented to person, place, and time. She appears well-developed and well-nourished.  Cardiovascular: Normal rate, regular rhythm and normal heart sounds.   Pulmonary/Chest: Effort normal and breath sounds normal.  Neurological: She is alert and oriented to person, place, and time.  Skin: Skin is warm and dry.  Psychiatric: She has a normal mood and affect. Her behavior is normal. Judgment and thought content normal.   BP 118/71  Pulse 91  Temp(Src) 97.9 F (36.6 C) (Oral)  Ht 5' 5" (1.651 m)  Wt 138 lb (62.596 kg)  BMI 22.96 kg/m2   EKG - NSR       Assessment & Plan:   1. Heart palpitations    Orders Placed This Encounter  Procedures  . TSH  . BMP8+EGFR  . Ambulatory referral to Cardiology    Referral Priority:  Routine    Referral Type:  Consultation    Referral Reason:  Specialty Services Required    Requested Specialty:  Cardiology    Number of Visits Requested:  1  . EKG 12-Lead   Labs  pending Keep diary of palpitations  Avoid caffeine   Mary-Margaret Hassell Done, FNP

## 2013-12-28 LAB — BMP8+EGFR
BUN/Creatinine Ratio: 25 (ref 11–26)
BUN: 22 mg/dL (ref 8–27)
CALCIUM: 9.7 mg/dL (ref 8.7–10.3)
CO2: 27 mmol/L (ref 18–29)
CREATININE: 0.89 mg/dL (ref 0.57–1.00)
Chloride: 102 mmol/L (ref 97–108)
GFR calc Af Amer: 81 mL/min/{1.73_m2} (ref >59)
GFR calc non Af Amer: 71 mL/min/{1.73_m2} (ref >59)
GLUCOSE: 95 mg/dL (ref 65–99)
POTASSIUM: 4.4 mmol/L (ref 3.5–5.2)
Sodium: 143 mmol/L (ref 134–144)

## 2013-12-28 LAB — TSH: TSH: 1.27 u[IU]/mL (ref 0.450–4.500)

## 2014-01-27 ENCOUNTER — Encounter: Payer: Self-pay | Admitting: Cardiovascular Disease

## 2014-01-27 ENCOUNTER — Ambulatory Visit (INDEPENDENT_AMBULATORY_CARE_PROVIDER_SITE_OTHER): Payer: BC Managed Care – PPO | Admitting: Cardiovascular Disease

## 2014-01-27 VITALS — BP 130/78 | HR 75 | Ht 65.0 in | Wt 137.0 lb

## 2014-01-27 DIAGNOSIS — R002 Palpitations: Secondary | ICD-10-CM

## 2014-01-27 DIAGNOSIS — R0989 Other specified symptoms and signs involving the circulatory and respiratory systems: Secondary | ICD-10-CM

## 2014-01-27 DIAGNOSIS — R0609 Other forms of dyspnea: Secondary | ICD-10-CM

## 2014-01-27 DIAGNOSIS — R0600 Dyspnea, unspecified: Secondary | ICD-10-CM

## 2014-01-27 NOTE — Patient Instructions (Signed)
Your physician recommends that you schedule a follow-up appointment in:  About 6 weeks.   Your physician has requested that you have an echocardiogram. Echocardiography is a painless test that uses sound waves to create images of your heart. It provides your doctor with information about the size and shape of your heart and how well your heart's chambers and valves are working. This procedure takes approximately one hour. There are no restrictions for this procedure.   Your physician has recommended that you wear a holter monitor. Holter monitors are medical devices that record the heart's electrical activity. Doctors most often use these monitors to diagnose arrhythmias. Arrhythmias are problems with the speed or rhythm of the heartbeat. The monitor is a small, portable device. You can wear one while you do your normal daily activities. This is usually used to diagnose what is causing palpitations/syncope (passing out).

## 2014-01-27 NOTE — Progress Notes (Signed)
     History of Present Illness: 63 yo female with history of hyperlipidemia, hypothyroidism here today as new patient for evaluation of palpitations. She tells me that her heart feels like it is "turning over". This is a skipped beat. She also notices a feeling of breathlessness and she feels dizzy. This does not occur at the same time. This only occurs every three months. No chest pain. No exertional dyspnea.   Primary Care Provider: Mary-Margaret Hassell Done  Last Lipid Profile:Lipid Panel     Component Value Date/Time   CHOL 124 08/30/2013 0823   TRIG 94 02/03/2013 1043   LDLCALC 54 02/03/2013 1043     Past Medical History  Diagnosis Date  . Allergy   . Hyperlipidemia   . Osteopenia   . Thyroid disease     hypothyroid  . History of cold sores   . Rosacea     Past Surgical History  Procedure Laterality Date  . Wisdom tooth extraction    . Tubal ligation      Current Outpatient Prescriptions  Medication Sig Dispense Refill  . atorvastatin (LIPITOR) 40 MG tablet Take 1 tablet (40 mg total) by mouth daily.  30 tablet  5  . Cholecalciferol (VITAMIN D3) 2000 UNITS TABS Take 1 tablet by mouth daily.        Marland Kitchen levothyroxine (LEVOXYL) 75 MCG tablet Take 75 mcg by mouth daily before breakfast.       No current facility-administered medications for this visit.    No Known Allergies  History   Social History  . Marital Status: Single    Spouse Name: N/A    Number of Children: 1  . Years of Education: N/A   Occupational History  . Cafeteria    Social History Main Topics  . Smoking status: Never Smoker   . Smokeless tobacco: Not on file  . Alcohol Use: No  . Drug Use: No  . Sexual Activity: Not on file   Other Topics Concern  . Not on file   Social History Narrative  . No narrative on file    Family History  Problem Relation Age of Onset  . Colon cancer Neg Hx   . Stomach cancer Neg Hx   . Esophageal cancer Neg Hx   . Glaucoma Mother   . Cancer Father    LUNG  . Stroke Father   . Hypertension Father   . Cerebral aneurysm Mother   . Heart attack Paternal Uncle   . Heart attack Maternal Uncle     Review of Systems:  As stated in the HPI and otherwise negative.   BP 130/78  Pulse 75  Ht 5\' 5"  (1.651 m)  Wt 137 lb (62.143 kg)  BMI 22.80 kg/m2  SpO2 99%  Physical Examination: General: Well developed, well nourished, NAD HEENT: OP clear, mucus membranes moist SKIN: warm, dry. No rashes. Neuro: No focal deficits Musculoskeletal: Muscle strength 5/5 all ext Psychiatric: Mood and affect normal Neck: No JVD, no carotid bruits, no thyromegaly, no lymphadenopathy. Lungs:Clear bilaterally, no wheezes, rhonci, crackles Cardiovascular: Regular rate and rhythm. No murmurs, gallops or rubs. Abdomen:Soft. Bowel sounds present. Non-tender.  Extremities: No lower extremity edema. Pulses are 2 + in the bilateral DP/PT.  EKG: 12/27/13: sinus, poor R wave progression  Assessment and Plan:   1. Palpitations: Likely premature beats. Will arrange 48 hour monitor.   2. Dyspnea: Will arrange echo to assess LVEF, exclude valvular disease.

## 2014-01-31 ENCOUNTER — Encounter (INDEPENDENT_AMBULATORY_CARE_PROVIDER_SITE_OTHER): Payer: BC Managed Care – PPO

## 2014-01-31 ENCOUNTER — Encounter: Payer: Self-pay | Admitting: *Deleted

## 2014-01-31 DIAGNOSIS — R002 Palpitations: Secondary | ICD-10-CM

## 2014-01-31 NOTE — Progress Notes (Signed)
Patient ID: [name] [name], female   DOB: 05/07/1951, 63 y.o.   MRN: [id number] E-Cardio 48 hour holter monitor applied to patient.

## 2014-02-07 ENCOUNTER — Encounter: Payer: Self-pay | Admitting: Cardiovascular Disease

## 2014-02-07 ENCOUNTER — Ambulatory Visit (HOSPITAL_COMMUNITY): Payer: BC Managed Care – PPO | Attending: Cardiovascular Disease | Admitting: Cardiology

## 2014-02-07 DIAGNOSIS — R0609 Other forms of dyspnea: Secondary | ICD-10-CM | POA: Insufficient documentation

## 2014-02-07 DIAGNOSIS — R0600 Dyspnea, unspecified: Secondary | ICD-10-CM

## 2014-02-07 DIAGNOSIS — R0989 Other specified symptoms and signs involving the circulatory and respiratory systems: Principal | ICD-10-CM | POA: Insufficient documentation

## 2014-02-07 NOTE — Progress Notes (Signed)
Echo performed. 

## 2014-02-10 ENCOUNTER — Telehealth: Payer: Self-pay | Admitting: *Deleted

## 2014-02-10 MED ORDER — DILTIAZEM HCL ER COATED BEADS 120 MG PO CP24: 120.0000 mg | ORAL_CAPSULE | Freq: Every day | ORAL | Status: DC

## 2014-02-10 NOTE — Telephone Encounter (Signed)
Spoke with pt and reviewed monitor and echo results and instructions from Dr. Angelena Form with her. She will start Cardizem CD 120 mg by mouth daily.  Pres[facility] [place].

## 2014-02-10 NOTE — Telephone Encounter (Signed)
I placed call to pt to review echo and monitor results. Left message to call back

## 2014-03-09 ENCOUNTER — Other Ambulatory Visit: Payer: Self-pay

## 2014-03-09 MED ORDER — LEVOTHYROXINE SODIUM 75 MCG PO TABS: 75.0000 ug | ORAL_TABLET | Freq: Every day | ORAL | Status: DC

## 2014-03-13 ENCOUNTER — Ambulatory Visit (INDEPENDENT_AMBULATORY_CARE_PROVIDER_SITE_OTHER): Payer: BC Managed Care – PPO | Admitting: Cardiovascular Disease

## 2014-03-13 ENCOUNTER — Encounter: Payer: Self-pay | Admitting: Cardiovascular Disease

## 2014-03-13 VITALS — BP 120/70 | HR 80 | Ht 65.0 in | Wt 135.0 lb

## 2014-03-13 DIAGNOSIS — I471 Supraventricular tachycardia, unspecified: Secondary | ICD-10-CM

## 2014-03-13 DIAGNOSIS — I493 Ventricular premature depolarization: Secondary | ICD-10-CM

## 2014-03-13 DIAGNOSIS — I498 Other specified cardiac arrhythmias: Secondary | ICD-10-CM

## 2014-03-13 DIAGNOSIS — I4949 Other premature depolarization: Secondary | ICD-10-CM

## 2014-03-13 NOTE — Progress Notes (Signed)
History of Present Illness: 63 yo female with history of hyperlipidemia, hypothyroidism here today for cardiac follow up. I saw her as new patient 01/27/14 for evaluation of palpitations. She told me that her heart feels like it is "turning over". This is a skipped beat. She also notices a feeling of breathlessness and she feels dizzy. This does not occur at the same time. This only occurs every three months. No chest pain. No exertional dyspnea. I arranged a 48 hour monitor and an echo. Echo 02/07/14 showed normal LV function, no valve disease. Cardiac monitor showed PACs, PVCs and several short runs of SVT. She was started on Cardizem CD.   She is here today for follow up. She notes no difference in how she feels. She has rare palpitations. She is asking if she can stop Cardizem.   Primary Care Provider: Mary-Margaret Hassell Done  Last Lipid Profile:Lipid Panel     Component Value Date/Time   CHOL 124 08/30/2013 0823   TRIG 97 08/30/2013 0823   TRIG 94 02/03/2013 1043   HDL 45 08/30/2013 0823   LDLCALC 60 08/30/2013 0823   LDLCALC 54 02/03/2013 1043     Past Medical History  Diagnosis Date  . Allergy   . Hyperlipidemia   . Osteopenia   . Thyroid disease     hypothyroid  . History of cold sores   . Rosacea     Past Surgical History  Procedure Laterality Date  . Wisdom tooth extraction    . Tubal ligation      Current Outpatient Prescriptions  Medication Sig Dispense Refill  . atorvastatin (LIPITOR) 40 MG tablet Take 1 tablet (40 mg total) by mouth daily.  30 tablet  5  . Cholecalciferol (VITAMIN D3) 2000 UNITS TABS Take 1 tablet by mouth daily.        Marland Kitchen diltiazem (CARDIZEM CD) 120 MG 24 hr capsule Take 1 capsule (120 mg total) by mouth daily.  30 capsule  6  . levothyroxine (LEVOXYL) 75 MCG tablet Take 1 tablet (75 mcg total) by mouth daily before breakfast.  90 tablet  2  . MIRVASO 0.33 % GEL        No current facility-administered medications for this visit.    No Known  Allergies  History   Social History  . Marital Status: Single    Spouse Name: N/A    Number of Children: 1  . Years of Education: N/A   Occupational History  . Cafeteria    Social History Main Topics  . Smoking status: Never Smoker   . Smokeless tobacco: Not on file  . Alcohol Use: No  . Drug Use: No  . Sexual Activity: Not on file   Other Topics Concern  . Not on file   Social History Narrative  . No narrative on file    Family History  Problem Relation Age of Onset  . Colon cancer Neg Hx   . Stomach cancer Neg Hx   . Esophageal cancer Neg Hx   . Glaucoma Mother   . Cancer Father     LUNG  . Stroke Father   . Hypertension Father   . Cerebral aneurysm Mother   . Heart attack Paternal Uncle   . Heart attack Maternal Uncle     Review of Systems:  As stated in the HPI and otherwise negative.   BP 120/70  Pulse 80  Ht 5\' 5"  (1.651 m)  Wt 135 lb (61.236 kg)  BMI 22.47 kg/m2  Physical Examination: General: Well developed, well nourished, NAD HEENT: OP clear, mucus membranes moist SKIN: warm, dry. No rashes. Neuro: No focal deficits Musculoskeletal: Muscle strength 5/5 all ext Psychiatric: Mood and affect normal Neck: No JVD, no carotid bruits, no thyromegaly, no lymphadenopathy. Lungs:Clear bilaterally, no wheezes, rhonci, crackles Cardiovascular: Regular rate and rhythm. No murmurs, gallops or rubs. Abdomen:Soft. Bowel sounds present. Non-tender.  Extremities: No lower extremity edema. Pulses are 2 + in the bilateral DP/PT.  Echo 02/07/14: Left ventricle: The cavity size was normal. Systolic function was normal. The estimated ejection fraction was in the range of 55% to 60%. Wall motion was normal; there were no regional wall motion abnormalities. - Atrial septum: No defect or patent foramen ovale was identified.  Assessment and Plan:   1. SVT/Premature beats: Will stop Cardizem CD. She will avoid stimulants.  LV function is normal.

## 2014-03-13 NOTE — Patient Instructions (Signed)
Your physician wants you to follow-up in:  12 months.  You will receive a reminder letter in the mail two months in advance. If you don't receive a letter, please call our office to schedule the follow-up appointment.   

## 2014-05-01 ENCOUNTER — Telehealth: Payer: Self-pay | Admitting: Nurse Practitioner

## 2014-05-01 NOTE — Telephone Encounter (Signed)
Patient NTBS for follow up and lab work  

## 2014-05-02 NOTE — Telephone Encounter (Signed)
Patient has an appointment 8/18

## 2014-05-02 NOTE — Telephone Encounter (Signed)
Ok

## 2014-05-25 ENCOUNTER — Encounter: Payer: Self-pay | Admitting: Nurse Practitioner

## 2014-05-25 ENCOUNTER — Ambulatory Visit (INDEPENDENT_AMBULATORY_CARE_PROVIDER_SITE_OTHER): Payer: BC Managed Care – PPO | Admitting: Nurse Practitioner

## 2014-05-25 VITALS — BP 128/75 | HR 70 | Temp 98.5°F | Ht 65.0 in | Wt 137.0 lb

## 2014-05-25 DIAGNOSIS — M949 Disorder of cartilage, unspecified: Secondary | ICD-10-CM

## 2014-05-25 DIAGNOSIS — L719 Rosacea, unspecified: Secondary | ICD-10-CM

## 2014-05-25 DIAGNOSIS — E785 Hyperlipidemia, unspecified: Secondary | ICD-10-CM

## 2014-05-25 DIAGNOSIS — E039 Hypothyroidism, unspecified: Secondary | ICD-10-CM

## 2014-05-25 DIAGNOSIS — M899 Disorder of bone, unspecified: Secondary | ICD-10-CM

## 2014-05-25 DIAGNOSIS — M8580 Other specified disorders of bone density and structure, unspecified site: Secondary | ICD-10-CM

## 2014-05-25 MED ORDER — LEVOTHYROXINE SODIUM 75 MCG PO TABS: 75.0000 ug | ORAL_TABLET | Freq: Every day | ORAL | Status: DC

## 2014-05-25 MED ORDER — ATORVASTATIN CALCIUM 40 MG PO TABS: 40.0000 mg | ORAL_TABLET | Freq: Every day | ORAL | Status: DC

## 2014-05-25 NOTE — Patient Instructions (Signed)

## 2014-05-25 NOTE — Progress Notes (Signed)
Subjective:    Patient ID: [name] [name], female    DOB: 06/06/1951, 63 y.o.   MRN: [id number]  Patient here today for follow up of chronic medical problems. No complaints today.  Hyperlipidemia This is a chronic problem. The current episode started more than 1 year ago. The problem is controlled. Recent lipid tests were reviewed and are normal. Exacerbating diseases include hypothyroidism. She has no history of diabetes, obesity or nephrotic syndrome. There are no known factors aggravating her hyperlipidemia. Pertinent negatives include no focal sensory loss, focal weakness, leg pain, myalgias or shortness of breath. Current antihyperlipidemic treatment includes statins. The current treatment provides moderate improvement of lipids. Risk factors for coronary artery disease include family history.  Thyroid Problem Visit type: hypothyroidism. Patient reports no anxiety, depressed mood, diaphoresis, dry skin, hair loss, heat intolerance, hoarse voice, menstrual problem, nail problem, palpitations, tremors, visual change, weight gain or weight loss. The symptoms have been stable. Her past medical history is significant for hyperlipidemia. There is no history of diabetes.  Osteopenia Last bone density test was in 2012- will schedule Vit D def Takes OTC when she can remember to take. Rosacea Currently doing well- uses no medications for it currently- They did not help in the past    Review of Systems  Constitutional: Negative for weight loss, weight gain and diaphoresis.  HENT: Negative for hoarse voice.   Respiratory: Negative for shortness of breath.   Cardiovascular: Negative for palpitations.  Endocrine: Negative for heat intolerance.  Genitourinary: Negative for menstrual problem.  Musculoskeletal: Negative for myalgias.  Neurological: Negative for tremors and focal weakness.  All other systems reviewed and are negative.      Objective:   Physical Exam  Constitutional: She is  oriented to person, place, and time. She appears well-developed and well-nourished.  HENT:  Nose: Nose normal.  Mouth/Throat: Oropharynx is clear and moist.  Eyes: EOM are normal.  Neck: Trachea normal, normal range of motion and full passive range of motion without pain. Neck supple. No JVD present. Carotid bruit is not present. No thyromegaly present.  Cardiovascular: Normal rate, regular rhythm, normal heart sounds and intact distal pulses.  Exam reveals no gallop and no friction rub.   No murmur heard. Pulmonary/Chest: Effort normal and breath sounds normal.  Abdominal: Soft. Bowel sounds are normal. She exhibits no distension and no mass. There is no tenderness.  Musculoskeletal: Normal range of motion.  Lymphadenopathy:    She has no cervical adenopathy.  Neurological: She is alert and oriented to person, place, and time. She has normal reflexes.  Skin: Skin is warm and dry.  Psychiatric: She has a normal mood and affect. Her behavior is normal. Judgment and thought content normal.    BP 128/75  Pulse 70  Temp(Src) 98.5 F (36.9 C) (Oral)  Ht '5\' 5"'  (1.651 m)  Wt 137 lb (62.143 kg)  BMI 22.80 kg/m2       Assessment & Plan:   1. Hypothyroidism (acquired)   2. Hyperlipidemia   3. Rosacea   4. Osteopenia    Orders Placed This Encounter  Procedures  . DG Bone Density    Standing Status: Future     Number of Occurrences:      Standing Expiration Date: 07/25/2015    Order Specific Question:  Reason for Exam (SYMPTOM  OR DIAGNOSIS REQUIRED)    Answer:  osteopenia    Order Specific Question:  Is the patient pregnant?    Answer:  No    Order  Specific Question:  Preferred imaging location?    Answer:  Internal  . CMP14+EGFR  . NMR, lipoprofile  . Thyroid Panel With TSH   Meds ordered this encounter  Medications  . atorvastatin (LIPITOR) 40 MG tablet    Sig: Take 1 tablet (40 mg total) by mouth daily.    Dispense:  90 tablet    Refill:  1    Order Specific  Question:  Supervising Provider    Answer:  Chipper Herb [[id number]]  . levothyroxine (LEVOXYL) 75 MCG tablet    Sig: Take 1 tablet (75 mcg total) by mouth daily before breakfast.    Dispense:  90 tablet    Refill:  2    Order Specific Question:  Supervising Provider    Answer:  Chipper Herb [[id number]]    Labs pending Health maintenance reviewed Diet and exercise encouraged Continue all meds Follow up  In 6 month   [place], FNP

## 2014-05-26 LAB — CMP14+EGFR
A/G RATIO: 2 (ref 1.1–2.5)
ALK PHOS: 96 IU/L (ref 39–117)
ALT: 25 IU/L (ref 0–32)
AST: 19 IU/L (ref 0–40)
Albumin: 4.3 g/dL (ref 3.6–4.8)
BILIRUBIN TOTAL: 0.6 mg/dL (ref 0.0–1.2)
BUN / CREAT RATIO: 20 (ref 11–26)
BUN: 17 mg/dL (ref 8–27)
CO2: 25 mmol/L (ref 18–29)
CREATININE: 0.84 mg/dL (ref 0.57–1.00)
Calcium: 9.6 mg/dL (ref 8.7–10.3)
Chloride: 99 mmol/L (ref 97–108)
GFR calc non Af Amer: 76 mL/min/{1.73_m2} (ref >59)
GFR, EST AFRICAN AMERICAN: 87 mL/min/{1.73_m2} (ref >59)
GLOBULIN, TOTAL: 2.2 g/dL (ref 1.5–4.5)
Glucose: 85 mg/dL (ref 65–99)
Potassium: 4.2 mmol/L (ref 3.5–5.2)
Sodium: 141 mmol/L (ref 134–144)
Total Protein: 6.5 g/dL (ref 6.0–8.5)

## 2014-05-26 LAB — NMR, LIPOPROFILE
CHOLESTEROL: 144 mg/dL (ref 100–199)
HDL Cholesterol by NMR: 49 mg/dL (ref >39)
HDL Particle Number: 33.2 umol/L (ref >=30.5)
LDL Particle Number: 882 nmol/L (ref <1000)
LDL Size: 20.6 nm (ref >20.5)
LDLC SERPL CALC-MCNC: 74 mg/dL (ref 0–99)
LP-IR SCORE: 56 — AB (ref <=45)
SMALL LDL PARTICLE NUMBER: 390 nmol/L (ref <=527)
Triglycerides by NMR: 107 mg/dL (ref 0–149)

## 2014-05-26 LAB — THYROID PANEL WITH TSH
FREE THYROXINE INDEX: 3.5 (ref 1.2–4.9)
T3 Uptake Ratio: 29 % (ref 24–39)
T4, Total: 12 ug/dL (ref 4.5–12.0)
TSH: 2.56 u[IU]/mL (ref 0.450–4.500)

## 2014-08-16 ENCOUNTER — Encounter: Payer: Self-pay | Admitting: Pharmacist

## 2014-08-16 ENCOUNTER — Ambulatory Visit (INDEPENDENT_AMBULATORY_CARE_PROVIDER_SITE_OTHER): Payer: BC Managed Care – PPO

## 2014-08-16 ENCOUNTER — Ambulatory Visit (INDEPENDENT_AMBULATORY_CARE_PROVIDER_SITE_OTHER): Payer: BC Managed Care – PPO | Admitting: Pharmacist

## 2014-08-16 VITALS — Ht 65.0 in | Wt 140.0 lb

## 2014-08-16 DIAGNOSIS — M8580 Other specified disorders of bone density and structure, unspecified site: Secondary | ICD-10-CM

## 2014-08-16 DIAGNOSIS — R7989 Other specified abnormal findings of blood chemistry: Secondary | ICD-10-CM | POA: Insufficient documentation

## 2014-08-16 NOTE — Progress Notes (Signed)
Patient ID: [name] [name], female   DOB: [birth year], 63 y.o.   MRN: [id number]  [facility] Current Height: Height: 5\' 5"  (165.1 cm)      Max Lifetime Height:  5'6" Current Weight: Weight: 140 lb (63.504 kg)       Ethnicity:Caucasian    HPI: Does pt already have a diagnosis of:  Osteopenia?  Yes Osteoporosis?  No  Back Pain?  No       Kyphosis?  No Prior fracture?  No Med(s) for Osteoporosis/Osteopenia:  none Med(s) previously tried for Osteoporosis/Osteopenia:  none                                                             PMH: Age at menopause:  70's Hysterectomy?  No Oophorectomy?  No HRT? No Steroid Use?  No Thyroid med?  Yes History of cancer?  No History of digestive disorders (ie Crohn's)?  No Current or previous eating disorders?  No Last Vitamin D Result:  23.6 (11/18/214) Last GFR Result:  76 (05/25/2014)   FH/SH: Family history of osteoporosis?  No Parent with history of hip fracture?  No Family history of breast cancer?  No Exercise?  No Smoking?  No Alcohol?  No    Calcium Assessment Calcium Intake  # of servings/day  Calcium mg  Milk (8 oz) 1  x  300  = 300mg   Yogurt (4 oz) 0 x  200 = 0  Cheese (1 oz) 1 x  200 = 200mg   Other Calcium sources   250mg   Ca supplement 500mg  = 500mg    Estimated calcium intake per day 1250mg     DEXA Results Date of Test T-Score for AP Spine L1-L4 T-Score for Total Left Hip T-Score for Total Right Hip  08/16/2014 -1.5 -1.7 -1.6  05/14/2011 -1.4 -1.5 -1.5  04/04/2009 -1.0 -1.2 -1.3  05/10/1998 0.0 -0.9 -1.3   FRAX 10 year estimate: Total FX risk:  10%  (consider medication if >/= 20%) Hip FX risk:  1.5% (consider medication if >/= 3%)  Assessment: Osteopenia Low vitamin D  Recommendations: 1.  Discussed results of DEXA and FRAX 10 year estimate / fracture risk 2.  continue calcium 1200mg  daily through supplementation or diet. Patient to get vitamin D check at next appt / blood draw 10/2014 3.   recommend weight bearing exercise - 30 minutes at least 4 days per week.   4.  Counseled and educated about fall risk and prevention. 5.  Recheck DEXA:  2 years  Time spent counseling patient:  25 minutes   Cherre Robins, PharmD, CPP

## 2014-08-16 NOTE — Patient Instructions (Signed)
Calcium & Vitamin D: The Facts  Why is calcium and vitamin D consumption important? Calcium: . Most Americans do not consume adequate amounts of calcium! Calcium is required for proper muscle function, nerve communication, bone support, and many other functions in the body.  . The body uses bones as a source of calcium. Bones 'remodel' themselves continuously - the body constantly breaks bone down to release calcium and rebuilds bones by replacing calcium in the bone later.  . As we get older, the rate of bone breakdown occurs faster than bone rebuilding which could lead to osteopenia, osteoporosis, and possible fractures.   Vitamin D: . People naturally make vitamin D in the body when sunlight hits the skin and triggers a process that leads to vitamin D production. This natural vitamin D production requires about 10-15 minutes of sun exposure on the hands, arms, and face at least 2-3 times per week. However, due to decreased sun exposure and the use of sunscreen, most people will need to get additional vitamin D from foods or supplements. Your doctor can measure your body's vitamin D level through a simple blood test to determine your daily vitamin D needs.  . Vitamin D is used to help the body absorb calcium, maintain bone health, help the immune system, and reduce inflammation. It also plays a role in muscle performance, balance and risk of falling.  . Vitamin D deficiency can lead to osteomalacia or softening of the bones, bone pain, and muscle weakness.   The recommended daily allowance of Calcium and Vitamin D varies for different age groups. Age group Calcium (mg) Vitamin D (IU)  Females and Males: Age 55-50 1000 mg 600 IU  Females: Age 28- 86 1200 mg 600 IU  Males: Age 30-70 1000 mg 600 IU  Females and Males: Age 54+ 1200 mg 800 IU  Pregnant/lactating Females age 82-50 1000 mg 600 IU   How much Calcium do you get in your diet? Calcium Intake # of servings per day  Total calcium (mg)   Skim milk, 2% milk (1 cup) _________ x 300 mg   Yogurt (1 small container) _________ x 200 mg   Cheese (1oz) _________ x 200 mg   Cottage Cheese (1 cup)             ________ x 150 mg   Almond milk (1 cup) _________ x 450 mg   Fortified Orange Juice (1 cup) _________ x 300 mg   Broccoli or spinach ( 1 cup) _________ x 100 mg   Salmon (3 oz) _________ x 150 mg    Almonds (1/4 cup) _______ x 90 mg      How do we get Calcium and Vitamin D in our diet? Calcium: . Obtaining calcium from the diet is the most preferred way to reach the recommended daily goal. If this goal is not reached through diet, calcium supplements are available.  . Calcium is found in many foods including: dairy products, dark leafy vegetables (like broccoli, kale, and spinach), fish, and fortified products like juices and cereals.  . The food label will have a %DV (percent daily value) listed showing the amount of calcium per serving. To determine the total mg per serving, simply replace the % with zero (0).  For example, Almond Breeze almond milk contains 45% DV of calcium or 4107m per 1 cup.  . You can increase the amount of calcium in your diet by using more calcium products in your daily meals. Use yogurt and fruit to  make smoothies or use yogurt to top baked potatoes or make whipped potatoes. Sprinkle low fat cheese onto salads or into egg white omelets. You can even add non-fat dry milk powder (300mg calcium per 1/3 cup) to hot cereals, meat loaf, soups, or potatoes.  . Calcium supplements come in many forms including tablets, chewables, and gummies. Be sure to read the label to determine the correct number of tablets per serving and whether or not to take the supplement with food.  . Calcium carbonate products (Oscal, Caltrate, and Viactiv) are generally better absorbed when taken with food while calcium citrate products like Citracal can be taken with or without food.  . The body can only absorb about 600 mg of calcium  at one time. It is recommended to take calcium supplements in small amounts several times per day.  However, taking it all at once is better than not taking it at all. . Increasing your intake of calcium is essential for bone health, but may also lead to some side effects like constipation, increased gas, bloating or abdominal cramping. To help reduce these side effects, start with 1 tablet per day and slowly increase your intake of the supplement to the recommended doses. It is also recommended that you drink plenty of water each day. Vitamin D: . Very few foods naturally contain vitamin D. However, it is found in saltwater fish (like tuna, salmon and mackerel), beef liver, egg yolks, cheese and vitamin D fortified foods (like yogurt, cereals, orange juice and milk) . The amount of vitamin D in each food or product is listed as %DV on the product label. To determine the total amount of vitamin D per serving, drop the % sign and multiply the number by 4. For example, 1 cup of Almond Breeze almond milk contains 25% DV vitamin D or 100 IU per serving (25 x 4 =100). . Vitamin D is also found in multivitamins and supplements and may be listed as ergocalciferol (vitamin D2) or cholecalciferol (vitamin D3). Each of these forms of vitamin D are equivalent and the daily recommended intake will vary based on your age and the vitamin D levels in your body. Follow your doctor's recommendation for vitamin D intake.                    Exercise for Strong Bones  Exercise is important to build and maintain strong bones / bone density.  There are 2 types of exercises that are important to building and maintaining strong bones:  Weight- bearing and muscle-stregthening.  Weight-bearing Exercises  These exercises include activities that make you move against gravity while staying upright. Weight-bearing exercises can be high-impact or low-impact.  High-impact weight-bearing exercises help build bones and keep them  strong. If you have broken a bone due to osteoporosis or are at risk of breaking a bone, you may need to avoid high-impact exercises. If you're not sure, you should check with your healthcare provider.  Examples of high-impact weight-bearing exercises are: Dancing  Doing high-impact aerobics  Hiking  Jogging/running  Jumping Rope  Stair climbing  Tennis  Low-impact weight-bearing exercises can also help keep bones strong and are a safe alternative if you cannot do high-impact exercises.   Examples of low-impact weight-bearing exercises are: Using elliptical training machines  Doing low-impact aerobics  Using stair-step machines  Fast walking on a treadmill or outside   Muscle-Strengthening Exercises These exercises include activities where you move your body, a weight or some other resistance   against gravity. They are also known as resistance exercises and include: Lifting weights  Using elastic exercise bands  Using weight machines  Lifting your own body weight  Functional movements, such as standing and rising up on your toes  Yoga and Pilates can also improve strength, balance and flexibility. However, certain positions may not be safe for people with osteoporosis or those at increased risk of broken bones. For example, exercises that have you bend forward may increase the chance of breaking a bone in the spine.   Non-Impact Exercises There are other types of exercises that can help prevent falls.  Non-impact exercises can help you to improve balance, posture and how well you move in everyday activities. Some of these exercises include: Balance exercises that strengthen your legs and test your balance, such as Tai Chi, can decrease your risk of falls.  Posture exercises that improve your posture and reduce rounded or "sloping" shoulders can help you decrease the chance of breaking a bone, especially in the spine.  Functional exercises that improve how well you move can help you  with everyday activities and decrease your chance of falling and breaking a bone. For example, if you have trouble getting up from a chair or climbing stairs, you should do these activities as exercises.   **A physical therapist can teach you balance, posture and functional exercises. He/she can also help you learn which exercises are safe and appropriate for you.  [facility] has a physical therapy office in [place] in front of our office and referrals can be made for assessments and treatment as needed and strength and balance training.  If you would like to have an assessment with [facility] and our physical therapy team please let a nurse or provider know.   Fall Prevention and Home Safety Falls cause injuries and can affect all age groups. It is possible to use preventive measures to significantly decrease the likelihood of falls. There are many simple measures which can make your home safer and prevent falls. OUTDOORS  Repair cracks and edges of walkways and driveways.  Remove high doorway thresholds.  Trim shrubbery on the main path into your home.  Have good outside lighting.  Clear walkways of tools, rocks, debris, and clutter.  Check that handrails are not broken and are securely fastened. Both sides of steps should have handrails.  Have leaves and acorns, snow, and ice cleared regularly.  Use sand or salt on walkways during winter months.  In the garage, clean up grease or oil spills. BATHROOM  Install night lights.  Install grab bars by the toilet and in the tub and shower.  Use non-skid mats or decals in the tub or shower.  Place a plastic non-slip stool in the shower to sit on, if needed.  Keep floors dry and clean up all water on the floor immediately.  Remove soap buildup in the tub or shower on a regular basis.  Secure bath mats with non-slip, double-sided rug tape.  Remove throw rugs and tripping hazards from the floors. BEDROOMS  Install night  lights.  Make sure a bedside light is easy to reach.  Do not use oversized bedding.  Keep a telephone by your bedside.  Have a firm chair with side arms to use for getting dressed.  Remove throw rugs and tripping hazards from the floor. KITCHEN  Keep handles on pots and pans turned toward the center of the stove. Use back burners when possible.  Clean up spills quickly and allow  time for drying.  Avoid walking on wet floors.  Avoid hot utensils and knives.  Position shelves so they are not too high or low.  Place commonly used objects within easy reach.  If necessary, use a sturdy step stool with a grab bar when reaching.  Keep electrical cables out of the way.  Do not use floor polish or wax that makes floors slippery. If you must use wax, use non-skid floor wax.  Remove throw rugs and tripping hazards from the floor. STAIRWAYS  Never leave objects on stairs.  Place handrails on both sides of stairways and use them. Fix any loose handrails. Make sure handrails on both sides of the stairways are as long as the stairs.  Check carpeting to make sure it is firmly attached along stairs. Make repairs to worn or loose carpet promptly.  Avoid placing throw rugs at the top or bottom of stairways, or properly secure the rug with carpet tape to prevent slippage. Get rid of throw rugs, if possible.  Have an electrician put in a light switch at the top and bottom of the stairs. OTHER FALL PREVENTION TIPS  Wear low-heel or rubber-soled shoes that are supportive and fit well. Wear closed toe shoes.  When using a stepladder, make sure it is fully opened and both spreaders are firmly locked. Do not climb a closed stepladder.  Add color or contrast paint or tape to grab bars and handrails in your home. Place contrasting color strips on first and last steps.  Learn and use mobility aids as needed. Install an electrical emergency response system.  Turn on lights to avoid dark areas.  Replace light bulbs that burn out immediately. Get light switches that glow.  Arrange furniture to create clear pathways. Keep furniture in the same place.  Firmly attach carpet with non-skid or double-sided tape.  Eliminate uneven floor surfaces.  Select a carpet pattern that does not visually hide the edge of steps.  Be aware of all pets. OTHER HOME SAFETY TIPS  Set the water temperature for 120 F (48.8 C).  Keep emergency numbers on or near the telephone.  Keep smoke detectors on every level of the home and near sleeping areas. Document Released: 09/19/2002 Document Revised: 03/30/2012 Document Reviewed: 12/19/2011 [facility] Patient Information 2015 [place], [place]. This information is not intended to replace advice given to you by your health care provider. Make sure you discuss any questions you have with your health care provider.

## 2014-09-22 ENCOUNTER — Encounter: Payer: Self-pay | Admitting: *Deleted

## 2014-10-30 ENCOUNTER — Ambulatory Visit (INDEPENDENT_AMBULATORY_CARE_PROVIDER_SITE_OTHER): Payer: BC Managed Care – PPO | Admitting: Nurse Practitioner

## 2014-10-30 ENCOUNTER — Encounter: Payer: Self-pay | Admitting: Nurse Practitioner

## 2014-10-30 VITALS — BP 120/75 | HR 99 | Temp 97.7°F | Ht 65.0 in | Wt 139.0 lb

## 2014-10-30 DIAGNOSIS — E785 Hyperlipidemia, unspecified: Secondary | ICD-10-CM

## 2014-10-30 DIAGNOSIS — E039 Hypothyroidism, unspecified: Secondary | ICD-10-CM

## 2014-10-30 DIAGNOSIS — M8580 Other specified disorders of bone density and structure, unspecified site: Secondary | ICD-10-CM

## 2014-10-30 MED ORDER — LEVOTHYROXINE SODIUM 75 MCG PO TABS: 75.0000 ug | ORAL_TABLET | Freq: Every day | ORAL | Status: DC

## 2014-10-30 MED ORDER — ATORVASTATIN CALCIUM 40 MG PO TABS: 40.0000 mg | ORAL_TABLET | Freq: Every day | ORAL | Status: DC

## 2014-10-30 NOTE — Progress Notes (Signed)
  Subjective:    Patient ID: [name] [name], female    DOB: 06-23-1951, 64 y.o.   MRN: [id number]  Patient here today for follow up of chronic medical problems. No complaints today.  Hyperlipidemia This is a chronic problem. The current episode started more than 1 year ago. The problem is controlled. Recent lipid tests were reviewed and are normal. She has no history of diabetes, hypothyroidism or obesity. Pertinent negatives include no myalgias or shortness of breath. Current antihyperlipidemic treatment includes statins. The current treatment provides moderate improvement of lipids. Compliance problems include adherence to diet and adherence to exercise.  Risk factors for coronary artery disease include dyslipidemia, obesity and post-menopausal.  Thyroid Problem Patient reports no diaphoresis, heat intolerance, menstrual problem, palpitations, tremors or visual change. Her past medical history is significant for hyperlipidemia. There is no history of diabetes.  Osteopenia Last bone density test was in 2012- will schedule Vit D def Takes OTC when she can remember to take. Rosacea Currently doing well- uses no medications for it currently- They did not help in the past    Review of Systems  Constitutional: Negative for diaphoresis.  Respiratory: Negative for shortness of breath.   Cardiovascular: Negative for palpitations.  Endocrine: Negative for heat intolerance.  Genitourinary: Negative for menstrual problem.  Musculoskeletal: Negative for myalgias.  Neurological: Negative for tremors.  All other systems reviewed and are negative.      Objective:   Physical Exam  Constitutional: She is oriented to person, place, and time. She appears well-developed and well-nourished.  HENT:  Nose: Nose normal.  Mouth/Throat: Oropharynx is clear and moist.  Eyes: EOM are normal.  Neck: Trachea normal, normal range of motion and full passive range of motion without pain. Neck supple. No JVD  present. Carotid bruit is not present. No thyromegaly present.  Cardiovascular: Normal rate, regular rhythm, normal heart sounds and intact distal pulses.  Exam reveals no gallop and no friction rub.   No murmur heard. Pulmonary/Chest: Effort normal and breath sounds normal.  Abdominal: Soft. Bowel sounds are normal. She exhibits no distension and no mass. There is no tenderness.  Musculoskeletal: Normal range of motion.  Lymphadenopathy:    She has no cervical adenopathy.  Neurological: She is alert and oriented to person, place, and time. She has normal reflexes.  Skin: Skin is warm and dry.  Psychiatric: She has a normal mood and affect. Her behavior is normal. Judgment and thought content normal.    BP 120/75 mmHg  Pulse 99  Temp(Src) 97.7 F (36.5 C) (Oral)  Ht $R'5\' 5"'oQ$  (1.651 m)  Wt 139 lb (63.05 kg)  BMI 23.13 kg/m2       Assessment & Plan:   1. Hyperlipidemia Do not add salt to diet - atorvastatin (LIPITOR) 40 MG tablet; Take 1 tablet (40 mg total) by mouth daily.  Dispense: 90 tablet; Refill: 1 - CMP14+EGFR - NMR, lipoprofile  2. Hypothyroidism (acquired) - levothyroxine (LEVOXYL) 75 MCG tablet; Take 1 tablet (75 mcg total) by mouth daily before breakfast.  Dispense: 90 tablet; Refill: 2 - Thyroid Panel With TSH  3. Osteopenia Weight bearing exercises - Vit D  25 hydroxy (rtn osteoporosis monitoring)    Labs pending Health maintenance reviewed Diet and exercise encouraged Continue all meds Follow up  In 6 month   [place], FNP

## 2014-10-30 NOTE — Patient Instructions (Signed)
Bone Health Our bones do many things. They provide structure, protect organs, anchor muscles, and store calcium. Adequate calcium in your diet and weight-bearing physical activity help build strong bones, improve bone amounts, and may reduce the risk of weakening of bones (osteoporosis) later in life. PEAK BONE MASS By age 62, the average woman has acquired most of her skeletal bone mass. A large decline occurs in older adults which increases the risk of osteoporosis. In women this occurs around the time of menopause. It is important for young girls to reach their peak bone mass in order to maintain bone health throughout life. A person with high bone mass as a young adult will be more likely to have a higher bone mass later in life. Not enough calcium consumption and physical activity early on could result in a failure to achieve optimum bone mass in adulthood. OSTEOPOROSIS Osteoporosis is a disease of the bones. It is defined as low bone mass with deterioration of bone structure. Osteoporosis leads to an increase risk of fractures with falls. These fractures commonly happen in the wrist, hip, and spine. While men and women of all ages and background can develop osteoporosis, some of the risk factors for osteoporosis are:  Female.  White.  Postmenopausal.  Older adults.  Small in body size.  Eating a diet low in calcium.  Physically inactive.  Smoking.  Use of some medications.  Family history. CALCIUM Calcium is a mineral needed by the body for healthy bones, teeth, and proper function of the heart, muscles, and nerves. The body cannot produce calcium so it must be absorbed through food. Good sources of calcium include:  Dairy products (low fat or nonfat milk, cheese, and yogurt).  Dark green leafy vegetables (bok choy and broccoli).  Calcium fortified foods (orange juice, cereal, bread, soy beverages, and tofu products).  Nuts (almonds). Recommended amounts of calcium vary  for individuals. RECOMMENDED CALCIUM INTAKES Age and Amount in mg per day  Children 1 to 3 years / 700 mg  Children 4 to 8 years / 1,000 mg  Children 9 to 13 years / 1,300 mg  Teens 14 to 18 years / 1,300 mg  Adults 19 to 50 years / 1,000 mg  Adult women 51 to 70 years / 1,200 mg  Adults 71 years and older / 1,200 mg  Pregnant and breastfeeding teens / 1,300 mg  Pregnant and breastfeeding adults / 1,000 mg Vitamin D also plays an important role in healthy bone development. Vitamin D helps in the absorption of calcium. WEIGHT-BEARING PHYSICAL ACTIVITY Regular physical activity has many positive health benefits. Benefits include strong bones. Weight-bearing physical activity early in life is important in reaching peak bone mass. Weight-bearing physical activities cause muscles and bones to work against gravity. Some examples of weight bearing physical activities include:  Walking, jogging, or running.  Field Hockey.  Jumping rope.  Dancing.  Soccer.  Tennis or Racquetball.  Stair climbing.  Basketball.  Hiking.  Weight lifting.  Aerobic fitness classes. Including weight-bearing physical activity into an exercise plan is a great way to keep bones healthy. Adults: Engage in at least 30 minutes of moderate physical activity on most, preferably all, days of the week. Children: Engage in at least 60 minutes of moderate physical activity on most, preferably all, days of the week. FOR MORE INFORMATION [place] Department of Agriculture, Center for Nutrition Policy and Promotion: www.cnpp.usda.gov National Osteoporosis Foundation: www.nof.org Document Released: 12/20/2003 Document Revised: 01/24/2013 Document Reviewed: 03/21/2009 [employer] Patient Information   2015 [employer]. This information is not intended to replace advice given to you by your health care provider. Make sure you discuss any questions you have with your health care provider.  

## 2014-10-31 ENCOUNTER — Ambulatory Visit: Payer: BC Managed Care – PPO | Admitting: Nurse Practitioner

## 2014-10-31 LAB — NMR, LIPOPROFILE
Cholesterol: 138 mg/dL (ref 100–199)
HDL Cholesterol by NMR: 49 mg/dL (ref >39)
HDL Particle Number: 36 umol/L (ref >=30.5)
LDL Particle Number: 657 nmol/L (ref <1000)
LDL Size: 20.6 nm (ref >20.5)
LDL-C: 65 mg/dL (ref 0–99)
LP-IR SCORE: 51 — AB (ref <=45)
SMALL LDL PARTICLE NUMBER: 217 nmol/L (ref <=527)
Triglycerides by NMR: 118 mg/dL (ref 0–149)

## 2014-10-31 LAB — CMP14+EGFR
A/G RATIO: 1.9 (ref 1.1–2.5)
ALBUMIN: 4.3 g/dL (ref 3.6–4.8)
ALT: 31 IU/L (ref 0–32)
AST: 18 IU/L (ref 0–40)
Alkaline Phosphatase: 84 IU/L (ref 39–117)
BILIRUBIN TOTAL: 0.5 mg/dL (ref 0.0–1.2)
BUN/Creatinine Ratio: 20 (ref 11–26)
BUN: 20 mg/dL (ref 8–27)
CALCIUM: 9.5 mg/dL (ref 8.7–10.3)
CHLORIDE: 103 mmol/L (ref 97–108)
CO2: 25 mmol/L (ref 18–29)
Creatinine, Ser: 0.98 mg/dL (ref 0.57–1.00)
GFR, EST AFRICAN AMERICAN: 72 mL/min/{1.73_m2} (ref >59)
GFR, EST NON AFRICAN AMERICAN: 62 mL/min/{1.73_m2} (ref >59)
Globulin, Total: 2.3 g/dL (ref 1.5–4.5)
Glucose: 95 mg/dL (ref 65–99)
Potassium: 4.3 mmol/L (ref 3.5–5.2)
Sodium: 141 mmol/L (ref 134–144)
TOTAL PROTEIN: 6.6 g/dL (ref 6.0–8.5)

## 2014-10-31 LAB — THYROID PANEL WITH TSH
Free Thyroxine Index: 2.4 (ref 1.2–4.9)
T3 UPTAKE RATIO: 26 % (ref 24–39)
T4 TOTAL: 9.2 ug/dL (ref 4.5–12.0)
TSH: 12.83 u[IU]/mL — ABNORMAL HIGH (ref 0.450–4.500)

## 2014-10-31 LAB — VITAMIN D 25 HYDROXY (VIT D DEFICIENCY, FRACTURES): Vit D, 25-Hydroxy: 36.3 ng/mL (ref 30.0–100.0)

## 2014-11-02 ENCOUNTER — Other Ambulatory Visit: Payer: Self-pay | Admitting: Nurse Practitioner

## 2014-11-02 MED ORDER — LEVOTHYROXINE SODIUM 100 MCG PO TABS: 100.0000 ug | ORAL_TABLET | Freq: Every day | ORAL | Status: DC

## 2014-11-02 MED ORDER — LEVOXYL 100 MCG PO TABS: 100.0000 ug | ORAL_TABLET | Freq: Every day | ORAL | Status: DC

## 2014-12-08 ENCOUNTER — Ambulatory Visit (INDEPENDENT_AMBULATORY_CARE_PROVIDER_SITE_OTHER): Payer: BC Managed Care – PPO | Admitting: Nurse Practitioner

## 2014-12-08 ENCOUNTER — Encounter: Payer: Self-pay | Admitting: Nurse Practitioner

## 2014-12-08 VITALS — BP 113/73 | HR 98 | Temp 98.7°F | Ht 65.0 in | Wt 136.0 lb

## 2014-12-08 DIAGNOSIS — J020 Streptococcal pharyngitis: Secondary | ICD-10-CM

## 2014-12-08 DIAGNOSIS — J029 Acute pharyngitis, unspecified: Secondary | ICD-10-CM

## 2014-12-08 LAB — POCT RAPID STREP A (OFFICE): Rapid Strep A Screen: POSITIVE — AB

## 2014-12-08 MED ORDER — AMOXICILLIN 875 MG PO TABS: 875.0000 mg | ORAL_TABLET | Freq: Two times a day (BID) | ORAL | Status: DC

## 2014-12-08 NOTE — Patient Instructions (Signed)

## 2014-12-08 NOTE — Progress Notes (Signed)
  Subjective:     [name] [name] is a 64 y.o. female who presents for evaluation of sore throat. Associated symptoms include chest congestion, sinus and nasal congestion and sore throat. Onset of symptoms was 1 day ago, and have been unchanged since that time. She is drinking plenty of fluids. She has not had a recent close exposure to someone with proven streptococcal pharyngitis.  The following portions of the patient's history were reviewed and updated as appropriate: allergies, current medications, past family history, past medical history, past social history, past surgical history and problem list.  Review of Systems Pertinent items are noted in HPI.    Objective:    BP 113/73 mmHg  Pulse 98  Temp(Src) 98.7 F (37.1 C) (Oral)  Ht 5\' 5"  (1.651 m)  Wt 136 lb (61.689 kg)  BMI 22.63 kg/m2 General appearance: alert and cooperative Eyes: conjunctivae/corneas clear. PERRL, EOM's intact. Fundi benign. Ears: normal TM's and external ear canals both ears Nose: Nares normal. Septum midline. Mucosa normal. No drainage or sinus tenderness. Throat: lips, mucosa, and tongue normal; teeth and gums normal Neck: no adenopathy, no carotid bruit, no JVD, supple, symmetrical, trachea midline and thyroid not enlarged, symmetric, no tenderness/mass/nodules Lungs: normal percussion bilaterally Heart: regular rate and rhythm, S1, S2 normal, no murmur, click, rub or gallop  Laboratory Strep test  done.   Results for orders placed or performed in visit on 12/08/14  POCT rapid strep A  Result Value Ref Range   Rapid Strep A Screen Positive (A) Negative      Assessment:    Acute pharyngitis, likely      Plan:      1. Take meds as prescribed 2. Use a cool mist humidifier especially during the winter months and when heat has been humid. 3. Use saline nose sprays frequently 4. Saline irrigations of the nose can be very helpful if done frequently.  * 4X daily for 1 week*  * Use of a nettie pot  can be helpful with this. Follow directions with this* 5. Drink plenty of fluids 6. Keep thermostat turn down low 7.For any cough or congestion  Use plain Mucinex- regular strength or max strength is fine   * Children- consult with Pharmacist for dosing 8. For fever or aces or pains- take tylenol or ibuprofen appropriate for age and weight.  * for fevers greater than 101 orally you may alternate ibuprofen and tylenol every  3 hours.   Meds ordered this encounter  Medications  . amoxicillin (AMOXIL) 875 MG tablet    Sig: Take 1 tablet (875 mg total) by mouth 2 (two) times daily.    Dispense:  20 tablet    Refill:  0    Order Specific Question:  Supervising Provider    Answer:  Chipper Herb [[id number]]   [place], FNP

## 2015-02-12 ENCOUNTER — Ambulatory Visit (INDEPENDENT_AMBULATORY_CARE_PROVIDER_SITE_OTHER): Payer: BC Managed Care – PPO | Admitting: Nurse Practitioner

## 2015-02-12 ENCOUNTER — Encounter: Payer: Self-pay | Admitting: Nurse Practitioner

## 2015-02-12 VITALS — BP 106/67 | HR 93 | Temp 97.7°F | Ht 65.0 in | Wt 137.0 lb

## 2015-02-12 DIAGNOSIS — J209 Acute bronchitis, unspecified: Secondary | ICD-10-CM | POA: Diagnosis not present

## 2015-02-12 MED ORDER — METHYLPREDNISOLONE ACETATE 80 MG/ML IJ SUSP
80.0000 mg | Freq: Once | INTRAMUSCULAR | Status: AC
  Administered 2015-02-12: 80 mg via INTRAMUSCULAR

## 2015-02-12 MED ORDER — HYDROCODONE-HOMATROPINE 5-1.5 MG/5ML PO SYRP: 5.0000 mL | ORAL_SOLUTION | Freq: Four times a day (QID) | ORAL | Status: DC | PRN

## 2015-02-12 MED ORDER — AMOXICILLIN 875 MG PO TABS: 875.0000 mg | ORAL_TABLET | Freq: Two times a day (BID) | ORAL | Status: DC

## 2015-02-12 NOTE — Patient Instructions (Signed)

## 2015-02-12 NOTE — Progress Notes (Signed)
  Subjective:     [name] [name] is a 64 y.o. female here for evaluation of a cough. Onset of symptoms was 9 days ago. Symptoms have been unchanged since that time. The cough is barky, hoarse and productive and is aggravated by nothing. Associated symptoms include: change in voice, fever and sore throat. Patient does not have a history of asthma. Patient does not have a history of environmental allergens. Patient has not traveled recently. Patient does not have a history of smoking. Patient has had a previous chest x-ray. Patient has not had a PPD done.  The following portions of the patient's history were reviewed and updated as appropriate: allergies, current medications, past family history, past medical history, past social history, past surgical history and problem list.  Review of Systems Pertinent items are noted in HPI.    Objective:     BP 106/67 mmHg  Pulse 93  Temp(Src) 97.7 F (36.5 C) (Oral)  Ht 5\' 5"  (1.651 m)  Wt 137 lb (62.143 kg)  BMI 22.80 kg/m2 Head: Normocephalic, without obvious abnormality, atraumatic Eyes: conjunctivae/corneas clear. PERRL, EOM's intact. Fundi benign. Ears: normal TM's and external ear canals both ears Nose: clear discharge, moderate congestion, turbinates red, no sinus tenderness Throat: lips, mucosa, and tongue normal; teeth and gums normal Neck: no adenopathy, no carotid bruit, no JVD, supple, symmetrical, trachea midline and thyroid not enlarged, symmetric, no tenderness/mass/nodules Lungs: clear to auscultation bilaterally, normal percussion bilaterally and barky cough Heart: regular rate and rhythm, S1, S2 normal, no murmur, click, rub or gallop    Assessment:    Acute Bronchitis    Plan:   1. Take meds as prescribed 2. Use a cool mist humidifier especially during the winter months and when heat has been humid. 3. Use saline nose sprays frequently 4. Saline irrigations of the nose can be very helpful if done frequently.  * 4X daily  for 1 week*  * Use of a nettie pot can be helpful with this. Follow directions with this* 5. Drink plenty of fluids 6. Keep thermostat turn down low 7.For any cough or congestion  Use plain Mucinex- regular strength or max strength is fine   * Children- consult with Pharmacist for dosing 8. For fever or aces or pains- take tylenol or ibuprofen appropriate for age and weight.  * for fevers greater than 101 orally you may alternate ibuprofen and tylenol every  3 hours.   Meds ordered this encounter  Medications  . amoxicillin (AMOXIL) 875 MG tablet    Sig: Take 1 tablet (875 mg total) by mouth 2 (two) times daily. 1 po BID    Dispense:  20 tablet    Refill:  0    Order Specific Question:  Supervising Provider    Answer:  Chipper Herb [[id number]]  . HYDROcodone-homatropine (HYCODAN) 5-1.5 MG/5ML syrup    Sig: Take 5 mLs by mouth every 6 (six) hours as needed for cough.    Dispense:  120 mL    Refill:  0    Order Specific Question:  Supervising Provider    Answer:  Chipper Herb [[id number]]  . methylPREDNISolone acetate (DEPO-MEDROL) injection 80 mg    Sig:    Mary-Margaret Hassell Done, FNP

## 2015-02-16 ENCOUNTER — Telehealth: Payer: Self-pay | Admitting: Nurse Practitioner

## 2015-02-16 NOTE — Telephone Encounter (Signed)
Appointment scheduled for 6/21 with Ronnald Collum, FNP.

## 2015-04-03 ENCOUNTER — Ambulatory Visit (INDEPENDENT_AMBULATORY_CARE_PROVIDER_SITE_OTHER): Payer: BC Managed Care – PPO | Admitting: Nurse Practitioner

## 2015-04-03 ENCOUNTER — Encounter: Payer: Self-pay | Admitting: Nurse Practitioner

## 2015-04-03 VITALS — BP 113/72 | HR 67 | Temp 97.6°F | Ht 65.0 in | Wt 130.8 lb

## 2015-04-03 DIAGNOSIS — M8580 Other specified disorders of bone density and structure, unspecified site: Secondary | ICD-10-CM

## 2015-04-03 DIAGNOSIS — E039 Hypothyroidism, unspecified: Secondary | ICD-10-CM

## 2015-04-03 DIAGNOSIS — E785 Hyperlipidemia, unspecified: Secondary | ICD-10-CM

## 2015-04-03 DIAGNOSIS — R7989 Other specified abnormal findings of blood chemistry: Secondary | ICD-10-CM | POA: Diagnosis not present

## 2015-04-03 DIAGNOSIS — L719 Rosacea, unspecified: Secondary | ICD-10-CM

## 2015-04-03 MED ORDER — ATORVASTATIN CALCIUM 40 MG PO TABS: 40.0000 mg | ORAL_TABLET | Freq: Every day | ORAL | Status: DC

## 2015-04-03 MED ORDER — LEVOXYL 100 MCG PO TABS: 100.0000 ug | ORAL_TABLET | Freq: Every day | ORAL | Status: DC

## 2015-04-03 NOTE — Patient Instructions (Signed)
Bone Health Our bones do many things. They provide structure, protect organs, anchor muscles, and store calcium. Adequate calcium in your diet and weight-bearing physical activity help build strong bones, improve bone amounts, and may reduce the risk of weakening of bones (osteoporosis) later in life. PEAK BONE MASS By age 62, the average woman has acquired most of her skeletal bone mass. A large decline occurs in older adults which increases the risk of osteoporosis. In women this occurs around the time of menopause. It is important for young girls to reach their peak bone mass in order to maintain bone health throughout life. A person with high bone mass as a young adult will be more likely to have a higher bone mass later in life. Not enough calcium consumption and physical activity early on could result in a failure to achieve optimum bone mass in adulthood. OSTEOPOROSIS Osteoporosis is a disease of the bones. It is defined as low bone mass with deterioration of bone structure. Osteoporosis leads to an increase risk of fractures with falls. These fractures commonly happen in the wrist, hip, and spine. While men and women of all ages and background can develop osteoporosis, some of the risk factors for osteoporosis are:  Female.  White.  Postmenopausal.  Older adults.  Small in body size.  Eating a diet low in calcium.  Physically inactive.  Smoking.  Use of some medications.  Family history. CALCIUM Calcium is a mineral needed by the body for healthy bones, teeth, and proper function of the heart, muscles, and nerves. The body cannot produce calcium so it must be absorbed through food. Good sources of calcium include:  Dairy products (low fat or nonfat milk, cheese, and yogurt).  Dark green leafy vegetables (bok choy and broccoli).  Calcium fortified foods (orange juice, cereal, bread, soy beverages, and tofu products).  Nuts (almonds). Recommended amounts of calcium vary  for individuals. RECOMMENDED CALCIUM INTAKES Age and Amount in mg per day  Children 1 to 3 years / 700 mg  Children 4 to 8 years / 1,000 mg  Children 9 to 13 years / 1,300 mg  Teens 14 to 18 years / 1,300 mg  Adults 19 to 50 years / 1,000 mg  Adult women 51 to 70 years / 1,200 mg  Adults 71 years and older / 1,200 mg  Pregnant and breastfeeding teens / 1,300 mg  Pregnant and breastfeeding adults / 1,000 mg Vitamin D also plays an important role in healthy bone development. Vitamin D helps in the absorption of calcium. WEIGHT-BEARING PHYSICAL ACTIVITY Regular physical activity has many positive health benefits. Benefits include strong bones. Weight-bearing physical activity early in life is important in reaching peak bone mass. Weight-bearing physical activities cause muscles and bones to work against gravity. Some examples of weight bearing physical activities include:  Walking, jogging, or running.  Field Hockey.  Jumping rope.  Dancing.  Soccer.  Tennis or Racquetball.  Stair climbing.  Basketball.  Hiking.  Weight lifting.  Aerobic fitness classes. Including weight-bearing physical activity into an exercise plan is a great way to keep bones healthy. Adults: Engage in at least 30 minutes of moderate physical activity on most, preferably all, days of the week. Children: Engage in at least 60 minutes of moderate physical activity on most, preferably all, days of the week. FOR MORE INFORMATION [place] Department of Agriculture, Center for Nutrition Policy and Promotion: www.cnpp.usda.gov National Osteoporosis Foundation: www.nof.org Document Released: 12/20/2003 Document Revised: 01/24/2013 Document Reviewed: 03/21/2009 [employer] Patient Information   2015 [employer]. This information is not intended to replace advice given to you by your health care provider. Make sure you discuss any questions you have with your health care provider.  

## 2015-04-03 NOTE — Progress Notes (Signed)
  Subjective:    Patient ID: [name] [name], female    DOB: 1951-04-10, 64 y.o.   MRN: [id number]  Patient here today for follow up of chronic medical problems. No complaints today.  Hyperlipidemia This is a chronic problem. The current episode started more than 1 year ago. The problem is controlled. Recent lipid tests were reviewed and are normal. She has no history of diabetes, hypothyroidism or obesity. Pertinent negatives include no myalgias or shortness of breath. Current antihyperlipidemic treatment includes statins. The current treatment provides moderate improvement of lipids. Compliance problems include adherence to diet and adherence to exercise.  Risk factors for coronary artery disease include dyslipidemia, obesity and post-menopausal.  Thyroid Problem Patient reports no diaphoresis, heat intolerance, menstrual problem, palpitations, tremors or visual change. Her past medical history is significant for hyperlipidemia. There is no history of diabetes.  Osteopenia Last bone density test was in 2012- will schedule Vit D def Takes OTC when she can remember to take. Rosacea Currently doing well- uses no medications for it currently- They did not help in the past    Review of Systems  Constitutional: Negative for diaphoresis.  Respiratory: Negative for shortness of breath.   Cardiovascular: Negative for palpitations.  Endocrine: Negative for heat intolerance.  Genitourinary: Negative for menstrual problem.  Musculoskeletal: Negative for myalgias.  Neurological: Negative for tremors.  All other systems reviewed and are negative.      Objective:   Physical Exam  Constitutional: She is oriented to person, place, and time. She appears well-developed and well-nourished.  HENT:  Nose: Nose normal.  Mouth/Throat: Oropharynx is clear and moist.  Eyes: EOM are normal.  Neck: Trachea normal, normal range of motion and full passive range of motion without pain. Neck supple. No JVD  present. Carotid bruit is not present. No thyromegaly present.  Cardiovascular: Normal rate, regular rhythm, normal heart sounds and intact distal pulses.  Exam reveals no gallop and no friction rub.   No murmur heard. Pulmonary/Chest: Effort normal and breath sounds normal.  Abdominal: Soft. Bowel sounds are normal. She exhibits no distension and no mass. There is no tenderness.  Musculoskeletal: Normal range of motion.  Lymphadenopathy:    She has no cervical adenopathy.  Neurological: She is alert and oriented to person, place, and time. She has normal reflexes.  Skin: Skin is warm and dry.  Psychiatric: She has a normal mood and affect. Her behavior is normal. Judgment and thought content normal.    BP 113/72 mmHg  Pulse 67  Temp(Src) 97.6 F (36.4 C) (Oral)  Ht $R'5\' 5"'fg$  (1.651 m)  Wt 130 lb 12.8 oz (59.33 kg)  BMI 21.77 kg/m2       Assessment & Plan:    1. Hypothyroidism (acquired) - Thyroid Panel With TSH - LEVOXYL 100 MCG tablet; Take 1 tablet (100 mcg total) by mouth daily before breakfast.  Dispense: 30 tablet; Refill: 5  2. Hyperlipidemia Low fat diet - CMP14+EGFR - Lipid panel - atorvastatin (LIPITOR) 40 MG tablet; Take 1 tablet (40 mg total) by mouth daily.  Dispense: 90 tablet; Refill: 1  3. Low serum vitamin D - Vit D  25 hydroxy (rtn osteoporosis monitoring)  4. Rosacea  5. Osteopenia Weight bearing exercises    Labs pending Health maintenance reviewed Diet and exercise encouraged Continue all meds Follow up  In 6 month   [place], FNP

## 2015-04-04 LAB — CMP14+EGFR
A/G RATIO: 1.9 (ref 1.1–2.5)
ALBUMIN: 4.2 g/dL (ref 3.6–4.8)
ALT: 25 IU/L (ref 0–32)
AST: 16 IU/L (ref 0–40)
Alkaline Phosphatase: 91 IU/L (ref 39–117)
BILIRUBIN TOTAL: 0.7 mg/dL (ref 0.0–1.2)
BUN/Creatinine Ratio: 19 (ref 11–26)
BUN: 16 mg/dL (ref 8–27)
CALCIUM: 9.7 mg/dL (ref 8.7–10.3)
CO2: 26 mmol/L (ref 18–29)
Chloride: 102 mmol/L (ref 97–108)
Creatinine, Ser: 0.84 mg/dL (ref 0.57–1.00)
GFR calc Af Amer: 87 mL/min/{1.73_m2} (ref >59)
GFR, EST NON AFRICAN AMERICAN: 75 mL/min/{1.73_m2} (ref >59)
GLUCOSE: 90 mg/dL (ref 65–99)
Globulin, Total: 2.2 g/dL (ref 1.5–4.5)
POTASSIUM: 4.6 mmol/L (ref 3.5–5.2)
Sodium: 141 mmol/L (ref 134–144)
TOTAL PROTEIN: 6.4 g/dL (ref 6.0–8.5)

## 2015-04-04 LAB — LIPID PANEL
Chol/HDL Ratio: 2.2 ratio units (ref 0.0–4.4)
Cholesterol, Total: 118 mg/dL (ref 100–199)
HDL: 54 mg/dL (ref >39)
LDL Calculated: 45 mg/dL (ref 0–99)
Triglycerides: 96 mg/dL (ref 0–149)
VLDL Cholesterol Cal: 19 mg/dL (ref 5–40)

## 2015-04-04 LAB — THYROID PANEL WITH TSH
Free Thyroxine Index: 3.4 (ref 1.2–4.9)
T3 UPTAKE RATIO: 28 % (ref 24–39)
T4 TOTAL: 12.2 ug/dL — AB (ref 4.5–12.0)
TSH: 0.843 u[IU]/mL (ref 0.450–4.500)

## 2015-04-04 LAB — VITAMIN D 25 HYDROXY (VIT D DEFICIENCY, FRACTURES): Vit D, 25-Hydroxy: 30.7 ng/mL (ref 30.0–100.0)

## 2015-04-25 ENCOUNTER — Ambulatory Visit (INDEPENDENT_AMBULATORY_CARE_PROVIDER_SITE_OTHER): Payer: BC Managed Care – PPO | Admitting: Physician Assistant

## 2015-04-25 ENCOUNTER — Encounter: Payer: Self-pay | Admitting: Physician Assistant

## 2015-04-25 VITALS — BP 94/60 | HR 84 | Temp 98.5°F | Ht 65.0 in | Wt 138.0 lb

## 2015-04-25 DIAGNOSIS — S80861A Insect bite (nonvenomous), right lower leg, initial encounter: Secondary | ICD-10-CM

## 2015-04-25 DIAGNOSIS — W57XXXA Bitten or stung by nonvenomous insect and other nonvenomous arthropods, initial encounter: Secondary | ICD-10-CM | POA: Diagnosis not present

## 2015-04-25 MED ORDER — DOXYCYCLINE HYCLATE 100 MG PO TABS: 100.0000 mg | ORAL_TABLET | Freq: Two times a day (BID) | ORAL | Status: DC

## 2015-04-25 NOTE — Progress Notes (Signed)
   Subjective:    Patient ID: [name] [name], female    DOB: 1950/12/08, 64 y.o.   MRN: [id number]  HPI 64 y/o female presents with c/o bug bite that occurred yesterday on her right leg . She felt the bite and brought the bug in with her today, which resembles a small tick. Asymptomatic regarding pain and itch.     Review of Systems  Constitutional: Negative for fever, chills, diaphoresis and fatigue.  HENT: Negative.   Cardiovascular: Negative.   Gastrointestinal: Negative.   Skin: Positive for color change and wound.  Neurological: Negative for dizziness, light-headedness, numbness and headaches.  Hematological: Does not bruise/bleed easily.       Objective:   Physical Exam  Constitutional: She is oriented to person, place, and time. She appears well-developed and well-nourished.  Pulmonary/Chest: Effort normal.  Neurological: She is alert and oriented to person, place, and time.  Skin: There is erythema.  Localized annular macule with central umbilication and surrounding ecchymosis, approximately 1.5 cm in diameter on right medial thigh  Psychiatric: She has a normal mood and affect. Her behavior is normal. Judgment and thought content normal.  Nursing note and vitals reviewed.         Assessment & Plan:  1. Tick bite of right lower leg, initial encounter  - doxycycline (VIBRA-TABS) 100 MG tablet; Take 1 tablet (100 mg total) by mouth 2 (two) times daily. With food  Dispense: 28 tablet; Refill: 0   RTO 2 weeks if symptoms persist   Kimball Appleby A. Benjamin Stain PA-C

## 2015-04-25 NOTE — Patient Instructions (Signed)
Tick Bite Information Ticks are insects that attach themselves to the skin and draw blood for food. There are various types of ticks. Common types include wood ticks and deer ticks. Most ticks live in shrubs and grassy areas. Ticks can climb onto your body when you make contact with leaves or grass where the tick is waiting. The most common places on the body for ticks to attach themselves are the scalp, neck, armpits, waist, and groin. Most tick bites are harmless, but sometimes ticks carry germs that cause diseases. These germs can be spread to a person during the tick's feeding process. The chance of a disease spreading through a tick bite depends on:   The type of tick.  Time of year.   How long the tick is attached.   Geographic location.  HOW CAN YOU PREVENT TICK BITES? Take these steps to help prevent tick bites when you are outdoors:  Wear protective clothing. Long sleeves and long pants are best.   Wear white clothes so you can see ticks more easily.  Tuck your pant legs into your socks.   If walking on a trail, stay in the middle of the trail to avoid brushing against bushes.  Avoid walking through areas with long grass.  Put insect repellent on all exposed skin and along boot tops, pant legs, and sleeve cuffs.   Check clothing, hair, and skin repeatedly and before going inside.   Brush off any ticks that are not attached.  Take a shower or bath as soon as possible after being outdoors.  WHAT IS THE PROPER WAY TO REMOVE A TICK? Ticks should be removed as soon as possible to help prevent diseases caused by tick bites. 1. If latex gloves are available, put them on before trying to remove a tick.  2. Using fine-point tweezers, grasp the tick as close to the skin as possible. You may also use curved forceps or a tick removal tool. Grasp the tick as close to its head as possible. Avoid grasping the tick on its body. 3. Pull gently with steady upward pressure until  the tick lets go. Do not twist the tick or jerk it suddenly. This may break off the tick's head or mouth parts. 4. Do not squeeze or crush the tick's body. This could force disease-carrying fluids from the tick into your body.  5. After the tick is removed, wash the bite area and your hands with soap and water or other disinfectant such as alcohol. 6. Apply a small amount of antiseptic cream or ointment to the bite site.  7. Wash and disinfect any instruments that were used.  Do not try to remove a tick by applying a hot match, petroleum jelly, or fingernail polish to the tick. These methods do not work and may increase the chances of disease being spread from the tick bite.  WHEN SHOULD YOU SEEK MEDICAL CARE? Contact your health care provider if you are unable to remove a tick from your skin or if a part of the tick breaks off and is stuck in the skin.  After a tick bite, you need to be aware of signs and symptoms that could be related to diseases spread by ticks. Contact your health care provider if you develop any of the following in the days or weeks after the tick bite:  Unexplained fever.  Rash. A circular rash that appears days or weeks after the tick bite may indicate the possibility of Lyme disease. The rash may resemble   a target with a bull's-eye and may occur at a different part of your body than the tick bite.  Redness and swelling in the area of the tick bite.   Tender, swollen lymph glands.   Diarrhea.   Weight loss.   Cough.   Fatigue.   Muscle, joint, or bone pain.   Abdominal pain.   Headache.   Lethargy or a change in your level of consciousness.  Difficulty walking or moving your legs.   Numbness in the legs.   Paralysis.  Shortness of breath.   Confusion.   Repeated vomiting.  Document Released: 09/26/2000 Document Revised: 07/20/2013 Document Reviewed: 03/09/2013 [employer] Patient Information 2015 [employer]. This information is  not intended to replace advice given to you by your health care provider. Make sure you discuss any questions you have with your health care provider.  

## 2015-10-02 ENCOUNTER — Encounter: Payer: Self-pay | Admitting: Nurse Practitioner

## 2015-10-02 ENCOUNTER — Ambulatory Visit (INDEPENDENT_AMBULATORY_CARE_PROVIDER_SITE_OTHER): Payer: BC Managed Care – PPO | Admitting: Nurse Practitioner

## 2015-10-02 VITALS — BP 123/79 | HR 73 | Temp 97.9°F | Ht 65.0 in | Wt 135.0 lb

## 2015-10-02 DIAGNOSIS — G4700 Insomnia, unspecified: Secondary | ICD-10-CM | POA: Diagnosis not present

## 2015-10-02 DIAGNOSIS — E785 Hyperlipidemia, unspecified: Secondary | ICD-10-CM

## 2015-10-02 DIAGNOSIS — M8580 Other specified disorders of bone density and structure, unspecified site: Secondary | ICD-10-CM | POA: Diagnosis not present

## 2015-10-02 DIAGNOSIS — Z1212 Encounter for screening for malignant neoplasm of rectum: Secondary | ICD-10-CM

## 2015-10-02 DIAGNOSIS — R7989 Other specified abnormal findings of blood chemistry: Secondary | ICD-10-CM

## 2015-10-02 DIAGNOSIS — L719 Rosacea, unspecified: Secondary | ICD-10-CM | POA: Diagnosis not present

## 2015-10-02 DIAGNOSIS — Z1159 Encounter for screening for other viral diseases: Secondary | ICD-10-CM

## 2015-10-02 DIAGNOSIS — E039 Hypothyroidism, unspecified: Secondary | ICD-10-CM | POA: Diagnosis not present

## 2015-10-02 MED ORDER — ATORVASTATIN CALCIUM 40 MG PO TABS: 40.0000 mg | ORAL_TABLET | Freq: Every day | ORAL | Status: DC

## 2015-10-02 MED ORDER — TRAZODONE HCL 50 MG PO TABS: 25.0000 mg | ORAL_TABLET | Freq: Every evening | ORAL | Status: DC | PRN

## 2015-10-02 MED ORDER — LEVOXYL 100 MCG PO TABS: 100.0000 ug | ORAL_TABLET | Freq: Every day | ORAL | Status: DC

## 2015-10-02 NOTE — Patient Instructions (Signed)
Bone Health Bones protect organs, store calcium, and anchor muscles. Good health habits, such as eating nutritious foods and exercising regularly, are important for maintaining healthy bones. They can also help to prevent a condition that causes bones to lose density and become weak and brittle (osteoporosis). WHY IS BONE MASS IMPORTANT? Bone mass refers to the amount of bone tissue that you have. The higher your bone mass, the stronger your bones. An important step toward having healthy bones throughout life is to have strong and dense bones during childhood. A young adult who has a high bone mass is more likely to have a high bone mass later in life. Bone mass at its greatest it is called peak bone mass. A large decline in bone mass occurs in older adults. In women, it occurs about the time of menopause. During this time, it is important to practice good health habits, because if more bone is lost than what is replaced, the bones will become less healthy and more likely to break (fracture). If you find that you have a low bone mass, you may be able to prevent osteoporosis or further bone loss by changing your diet and lifestyle. HOW CAN I FIND OUT IF MY BONE MASS IS LOW? Bone mass can be measured with an X-ray test that is called a bone mineral density (BMD) test. This test is recommended for all women who are age 65 or older. It may also be recommended for men who are age 70 or older, or for people who are more likely to develop osteoporosis due to:  Having bones that break easily.  Having a long-term disease that weakens bones, such as kidney disease or rheumatoid arthritis.  Having menopause earlier than normal.  Taking medicine that weakens bones, such as steroids, thyroid hormones, or hormone treatment for breast cancer or prostate cancer.  Smoking.  Drinking three or more alcoholic drinks each day. WHAT ARE THE NUTRITIONAL RECOMMENDATIONS FOR HEALTHY BONES? To have healthy bones, you need  to get enough of the right minerals and vitamins. Most nutrition experts recommend getting these nutrients from the foods that you eat. Nutritional recommendations vary from person to person. Ask your health care provider what is healthy for you. Here are some general guidelines. Calcium Recommendations Calcium is the most important (essential) mineral for bone health. Most people can get enough calcium from their diet, but supplements may be recommended for people who are at risk for osteoporosis. Good sources of calcium include:  Dairy products, such as low-fat or nonfat milk, cheese, and yogurt.  Dark green leafy vegetables, such as bok choy and broccoli.  Calcium-fortified foods, such as orange juice, cereal, bread, soy beverages, and tofu products.  Nuts, such as almonds. Follow these recommended amounts for daily calcium intake:  Children, age 1-3: 700 mg.  Children, age 4-8: 1,000 mg.  Children, age 9-13: 1,300 mg.  Teens, age 14-18: 1,300 mg.  Adults, age 19-50: 1,000 mg.  Adults, age 51-70:  Men: 1,000 mg.  Women: 1,200 mg.  Adults, age 71 or older: 1,200 mg.  Pregnant and breastfeeding females:  Teens: 1,300 mg.  Adults: 1,000 mg. Vitamin D Recommendations Vitamin D is the most essential vitamin for bone health. It helps the body to absorb calcium. Sunlight stimulates the skin to make vitamin D, so be sure to get enough sunlight. If you live in a cold climate or you do not get outside often, your health care provider may recommend that you take vitamin D supplements. Good   sources of vitamin D in your diet include:  Egg yolks.  Saltwater fish.  Milk and cereal fortified with vitamin D. Follow these recommended amounts for daily vitamin D intake:  Children and teens, age 1-18: 600 international units.  Adults, age 50 or younger: 400-800 international units.  Adults, age 51 or older: 800-1,000 international units. Other Nutrients Other nutrients for bone  health include:  Phosphorus. This mineral is found in meat, poultry, dairy foods, nuts, and legumes. The recommended daily intake for adult men and adult women is 700 mg.  Magnesium. This mineral is found in seeds, nuts, dark green vegetables, and legumes. The recommended daily intake for adult men is 400-420 mg. For adult women, it is 310-320 mg.  Vitamin K. This vitamin is found in green leafy vegetables. The recommended daily intake is 120 mg for adult men and 90 mg for adult women. WHAT TYPE OF PHYSICAL ACTIVITY IS BEST FOR BUILDING AND MAINTAINING HEALTHY BONES? Weight-bearing and strength-building activities are important for building and maintaining peak bone mass. Weight-bearing activities cause muscles and bones to work against gravity. Strength-building activities increases muscle strength that supports bones. Weight-bearing and muscle-building activities include:  Walking and hiking.  Jogging and running.  Dancing.  Gym exercises.  Lifting weights.  Tennis and racquetball.  Climbing stairs.  Aerobics. Adults should get at least 30 minutes of moderate physical activity on most days. Children should get at least 60 minutes of moderate physical activity on most days. Ask your health care provide what type of exercise is best for you. WHERE CAN I FIND MORE INFORMATION? For more information, check out the following websites:  National Osteoporosis Foundation: http://nof.org/learn/basics  National Institutes of Health: http://www.niams.nih.gov/Health_Info/Bone/Bone_Health/bone_health_for_life.asp   This information is not intended to replace advice given to you by your health care provider. Make sure you discuss any questions you have with your health care provider.   Document Released: 12/20/2003 Document Revised: 02/13/2015 Document Reviewed: 10/04/2014 [employer] Interactive Patient Education 2016 [employer]  

## 2015-10-02 NOTE — Progress Notes (Signed)
Subjective:    Patient ID: [name] [name], female    DOB: Feb 26, 1951, 64 y.o.   MRN: [id number]  Patient here today for follow up of chronic medical problems.  * patient c/o trouble sleeping- tried a friends xanax and slept all night- needs something. Has tried melatonin OTC which did not help   Hyperlipidemia This is a chronic problem. The current episode started more than 1 year ago. The problem is controlled. Recent lipid tests were reviewed and are normal. She has no history of diabetes, hypothyroidism or obesity. Pertinent negatives include no myalgias or shortness of breath. Current antihyperlipidemic treatment includes statins. The current treatment provides moderate improvement of lipids. Compliance problems include adherence to diet and adherence to exercise.  Risk factors for coronary artery disease include dyslipidemia, obesity and post-menopausal.  Thyroid Problem Patient reports no diaphoresis, heat intolerance, menstrual problem, palpitations, tremors or visual change. Her past medical history is significant for hyperlipidemia. There is no history of diabetes.  Osteopenia Last bone density test was in 2012- will schedule Vit D def Takes OTC when she can remember to take. Rosacea Currently doing well- uses no medications for it currently- They did not help in the past    Review of Systems  Constitutional: Negative for diaphoresis.  Respiratory: Negative for shortness of breath.   Cardiovascular: Negative for palpitations.  Endocrine: Negative for heat intolerance.  Genitourinary: Negative for menstrual problem.  Musculoskeletal: Negative for myalgias.  Neurological: Negative for tremors.  All other systems reviewed and are negative.      Objective:   Physical Exam  Constitutional: She is oriented to person, place, and time. She appears well-developed and well-nourished.  HENT:  Nose: Nose normal.  Mouth/Throat: Oropharynx is clear and moist.  Eyes: EOM are normal.   Neck: Trachea normal, normal range of motion and full passive range of motion without pain. Neck supple. No JVD present. Carotid bruit is not present. No thyromegaly present.  Cardiovascular: Normal rate, regular rhythm, normal heart sounds and intact distal pulses.  Exam reveals no gallop and no friction rub.   No murmur heard. Pulmonary/Chest: Effort normal and breath sounds normal.  Abdominal: Soft. Bowel sounds are normal. She exhibits no distension and no mass. There is no tenderness.  Musculoskeletal: Normal range of motion.  Lymphadenopathy:    She has no cervical adenopathy.  Neurological: She is alert and oriented to person, place, and time. She has normal reflexes.  Skin: Skin is warm and dry.  Psychiatric: She has a normal mood and affect. Her behavior is normal. Judgment and thought content normal.    BP 123/79 mmHg  Pulse 73  Temp(Src) 97.9 F (36.6 C) (Oral)  Ht '5\' 5"'  (1.651 m)  Wt 135 lb (61.236 kg)  BMI 22.47 kg/m2       Assessment & Plan:   1. Hypothyroidism (acquired) - LEVOXYL 100 MCG tablet; Take 1 tablet (100 mcg total) by mouth daily before breakfast.  Dispense: 30 tablet; Refill: 5 - Thyroid Panel With TSH  2. Rosacea  3. Osteopenia Weight bearing exercises  4. Hyperlipidemia Low fat diet - CMP14+EGFR - Lipid panel - atorvastatin (LIPITOR) 40 MG tablet; Take 1 tablet (40 mg total) by mouth daily.  Dispense: 90 tablet; Refill: 1  5. Low serum vitamin D - VITAMIN D 25 Hydroxy (Vit-D Deficiency, Fractures)  6. Screening for malignant neoplasm of the rectum - Fecal occult blood, imunochemical; Future  7. Need for hepatitis C screening test - Hepatitis C Antibody  8. Insomnia  Bedtime ritual - traZODone (DESYREL) 50 MG tablet; Take 0.5-1 tablets (25-50 mg total) by mouth at bedtime as needed for sleep.  Dispense: 30 tablet; Refill: 3    Labs pending Health maintenance reviewed Diet and exercise encouraged Continue all meds Follow up  In 6  months   [place], FNP

## 2015-10-05 ENCOUNTER — Other Ambulatory Visit: Payer: BC Managed Care – PPO

## 2015-10-05 NOTE — Progress Notes (Signed)
Lab only 

## 2015-10-06 LAB — LIPID PANEL
CHOL/HDL RATIO: 2.3 ratio (ref 0.0–4.4)
Cholesterol, Total: 115 mg/dL (ref 100–199)
HDL: 51 mg/dL (ref >39)
LDL CALC: 48 mg/dL (ref 0–99)
TRIGLYCERIDES: 82 mg/dL (ref 0–149)
VLDL Cholesterol Cal: 16 mg/dL (ref 5–40)

## 2015-10-06 LAB — CMP14+EGFR
A/G RATIO: 1.7 (ref 1.1–2.5)
ALT: 29 IU/L (ref 0–32)
AST: 23 IU/L (ref 0–40)
Albumin: 4 g/dL (ref 3.6–4.8)
Alkaline Phosphatase: 82 IU/L (ref 39–117)
BUN/Creatinine Ratio: 21 (ref 11–26)
BUN: 19 mg/dL (ref 8–27)
Bilirubin Total: 0.6 mg/dL (ref 0.0–1.2)
CO2: 26 mmol/L (ref 18–29)
CREATININE: 0.89 mg/dL (ref 0.57–1.00)
Calcium: 9.2 mg/dL (ref 8.7–10.3)
Chloride: 98 mmol/L (ref 96–106)
GFR, EST AFRICAN AMERICAN: 80 mL/min/{1.73_m2} (ref >59)
GFR, EST NON AFRICAN AMERICAN: 70 mL/min/{1.73_m2} (ref >59)
GLOBULIN, TOTAL: 2.4 g/dL (ref 1.5–4.5)
GLUCOSE: 89 mg/dL (ref 65–99)
POTASSIUM: 3.8 mmol/L (ref 3.5–5.2)
SODIUM: 139 mmol/L (ref 134–144)
Total Protein: 6.4 g/dL (ref 6.0–8.5)

## 2015-10-06 LAB — VITAMIN D 25 HYDROXY (VIT D DEFICIENCY, FRACTURES): Vit D, 25-Hydroxy: 31 ng/mL (ref 30.0–100.0)

## 2015-10-06 LAB — THYROID PANEL WITH TSH
Free Thyroxine Index: 3.5 (ref 1.2–4.9)
T3 UPTAKE RATIO: 32 % (ref 24–39)
T4, Total: 11 ug/dL (ref 4.5–12.0)
TSH: 3.23 u[IU]/mL (ref 0.450–4.500)

## 2015-10-06 LAB — HEPATITIS C ANTIBODY

## 2015-10-25 ENCOUNTER — Other Ambulatory Visit: Payer: Self-pay | Admitting: Nurse Practitioner

## 2016-01-09 ENCOUNTER — Encounter (INDEPENDENT_AMBULATORY_CARE_PROVIDER_SITE_OTHER): Payer: BC Managed Care – PPO | Admitting: Ophthalmology

## 2016-01-10 ENCOUNTER — Encounter (INDEPENDENT_AMBULATORY_CARE_PROVIDER_SITE_OTHER): Payer: BC Managed Care – PPO | Admitting: Ophthalmology

## 2016-01-10 DIAGNOSIS — H2513 Age-related nuclear cataract, bilateral: Secondary | ICD-10-CM

## 2016-01-10 DIAGNOSIS — H33303 Unspecified retinal break, bilateral: Secondary | ICD-10-CM

## 2016-01-10 DIAGNOSIS — H43813 Vitreous degeneration, bilateral: Secondary | ICD-10-CM | POA: Diagnosis not present

## 2016-01-10 DIAGNOSIS — H35413 Lattice degeneration of retina, bilateral: Secondary | ICD-10-CM

## 2016-01-10 DIAGNOSIS — H35372 Puckering of macula, left eye: Secondary | ICD-10-CM

## 2016-01-11 ENCOUNTER — Encounter (INDEPENDENT_AMBULATORY_CARE_PROVIDER_SITE_OTHER): Payer: BC Managed Care – PPO | Admitting: Ophthalmology

## 2016-01-11 DIAGNOSIS — H33302 Unspecified retinal break, left eye: Secondary | ICD-10-CM

## 2016-02-04 ENCOUNTER — Ambulatory Visit (INDEPENDENT_AMBULATORY_CARE_PROVIDER_SITE_OTHER): Payer: BC Managed Care – PPO | Admitting: Ophthalmology

## 2016-02-04 DIAGNOSIS — H33303 Unspecified retinal break, bilateral: Secondary | ICD-10-CM

## 2016-05-13 ENCOUNTER — Ambulatory Visit (INDEPENDENT_AMBULATORY_CARE_PROVIDER_SITE_OTHER): Payer: BC Managed Care – PPO | Admitting: Nurse Practitioner

## 2016-05-13 VITALS — BP 109/77 | HR 103 | Temp 97.6°F | Ht 65.0 in | Wt 135.0 lb

## 2016-05-13 DIAGNOSIS — G4700 Insomnia, unspecified: Secondary | ICD-10-CM | POA: Diagnosis not present

## 2016-05-13 DIAGNOSIS — E039 Hypothyroidism, unspecified: Secondary | ICD-10-CM | POA: Diagnosis not present

## 2016-05-13 DIAGNOSIS — E785 Hyperlipidemia, unspecified: Secondary | ICD-10-CM

## 2016-05-13 DIAGNOSIS — M8580 Other specified disorders of bone density and structure, unspecified site: Secondary | ICD-10-CM

## 2016-05-13 DIAGNOSIS — L719 Rosacea, unspecified: Secondary | ICD-10-CM | POA: Diagnosis not present

## 2016-05-13 MED ORDER — ATORVASTATIN CALCIUM 40 MG PO TABS: 40.0000 mg | ORAL_TABLET | Freq: Every day | ORAL | 1 refills | Status: DC

## 2016-05-13 MED ORDER — LEVOXYL 100 MCG PO TABS: 100.0000 ug | ORAL_TABLET | Freq: Every day | ORAL | 10 refills | Status: DC

## 2016-05-13 NOTE — Progress Notes (Signed)
Subjective:    Patient ID: [name] [name], female    DOB: 12/01/1950, 65 y.o.   MRN: [id number]  Patient here today for follow up of chronic medical problems.  Outpatient Encounter Prescriptions as of 05/13/2016  Medication Sig  . atorvastatin (LIPITOR) 40 MG tablet Take 1 tablet (40 mg total) by mouth daily.  . Calcium-Vitamin D-Vitamin K 500-100-40 MG-UNT-MCG CHEW Chew 1 tablet by mouth daily.  . Cholecalciferol (VITAMIN D3) 2000 UNITS TABS Take 1 tablet by mouth daily.    Marland Kitchen LEVOXYL 100 MCG tablet Take 1 tablet (100 mcg total) by mouth daily before breakfast.      Hyperlipidemia  This is a chronic problem. The current episode started more than 1 year ago. The problem is controlled. Recent lipid tests were reviewed and are normal. She has no history of diabetes, hypothyroidism or obesity. Pertinent negatives include no myalgias or shortness of breath. Current antihyperlipidemic treatment includes statins. The current treatment provides moderate improvement of lipids. Compliance problems include adherence to diet and adherence to exercise.  Risk factors for coronary artery disease include dyslipidemia, obesity and post-menopausal.  Thyroid Problem  Patient reports no diaphoresis, heat intolerance, menstrual problem, palpitations, tremors or visual change. Her past medical history is significant for hyperlipidemia. There is no history of diabetes.  Osteopenia Last bone density test was in 2012- will schedule Vit D def Takes OTC when she can remember to take. Rosacea Currently doing well- uses no medications for it currently- They did not help in the past insomnia  patient was put on trazadone at last visit but stopped taking - sh esays she sleeps good some nights and not others.      Review of Systems  Constitutional: Negative for diaphoresis.  Respiratory: Negative for shortness of breath.   Cardiovascular: Negative for palpitations.  Endocrine: Negative for heat intolerance.   Genitourinary: Negative for menstrual problem.  Musculoskeletal: Negative for myalgias.  Neurological: Negative for tremors.  All other systems reviewed and are negative.      Objective:   Physical Exam  Constitutional: She is oriented to person, place, and time. She appears well-developed and well-nourished.  HENT:  Nose: Nose normal.  Mouth/Throat: Oropharynx is clear and moist.  Eyes: EOM are normal.  Neck: Trachea normal, normal range of motion and full passive range of motion without pain. Neck supple. No JVD present. Carotid bruit is not present. No thyromegaly present.  Cardiovascular: Normal rate, regular rhythm, normal heart sounds and intact distal pulses.  Exam reveals no gallop and no friction rub.   No murmur heard. Pulmonary/Chest: Effort normal and breath sounds normal.  Abdominal: Soft. Bowel sounds are normal. She exhibits no distension and no mass. There is no tenderness.  Musculoskeletal: Normal range of motion.  Lymphadenopathy:    She has no cervical adenopathy.  Neurological: She is alert and oriented to person, place, and time. She has normal reflexes.  Skin: Skin is warm and dry.  Psychiatric: She has a normal mood and affect. Her behavior is normal. Judgment and thought content normal.    BP 109/77   Pulse (!) 103   Temp 97.6 F (36.4 C) (Oral)   Ht '5\' 5"'  (1.651 m)   Wt 135 lb (61.2 kg)   BMI 22.47 kg/m        Assessment & Plan:   1. Hypothyroidism (acquired) - LEVOXYL 100 MCG tablet; Take 1 tablet (100 mcg total) by mouth daily before breakfast.  Dispense: 30 tablet; Refill: 10  2. Osteopenia Weight  bearing exercises  3. Rosacea  4. Hyperlipidemia Low fat diet - atorvastatin (LIPITOR) 40 MG tablet; Take 1 tablet (40 mg total) by mouth daily.  Dispense: 90 tablet; Refill: 1 - CMP14+EGFR - Lipid panel  5. Insomnia Bedtime routine   Encouraged to do hemoccult cards given at last visit Labs pending Health maintenance reviewed Diet  and exercise encouraged Continue all meds Follow up  In 6 month   [place], FNP

## 2016-05-13 NOTE — Patient Instructions (Signed)
Bone Health Bones protect organs, store calcium, and anchor muscles. Good health habits, such as eating nutritious foods and exercising regularly, are important for maintaining healthy bones. They can also help to prevent a condition that causes bones to lose density and become weak and brittle (osteoporosis). WHY IS BONE MASS IMPORTANT? Bone mass refers to the amount of bone tissue that you have. The higher your bone mass, the stronger your bones. An important step toward having healthy bones throughout life is to have strong and dense bones during childhood. A young adult who has a high bone mass is more likely to have a high bone mass later in life. Bone mass at its greatest it is called peak bone mass. A large decline in bone mass occurs in older adults. In women, it occurs about the time of menopause. During this time, it is important to practice good health habits, because if more bone is lost than what is replaced, the bones will become less healthy and more likely to break (fracture). If you find that you have a low bone mass, you may be able to prevent osteoporosis or further bone loss by changing your diet and lifestyle. HOW CAN I FIND OUT IF MY BONE MASS IS LOW? Bone mass can be measured with an X-ray test that is called a bone mineral density (BMD) test. This test is recommended for all women who are age 65 or older. It may also be recommended for men who are age 70 or older, or for people who are more likely to develop osteoporosis due to:  Having bones that break easily.  Having a long-term disease that weakens bones, such as kidney disease or rheumatoid arthritis.  Having menopause earlier than normal.  Taking medicine that weakens bones, such as steroids, thyroid hormones, or hormone treatment for breast cancer or prostate cancer.  Smoking.  Drinking three or more alcoholic drinks each day. WHAT ARE THE NUTRITIONAL RECOMMENDATIONS FOR HEALTHY BONES? To have healthy bones, you need  to get enough of the right minerals and vitamins. Most nutrition experts recommend getting these nutrients from the foods that you eat. Nutritional recommendations vary from person to person. Ask your health care provider what is healthy for you. Here are some general guidelines. Calcium Recommendations Calcium is the most important (essential) mineral for bone health. Most people can get enough calcium from their diet, but supplements may be recommended for people who are at risk for osteoporosis. Good sources of calcium include:  Dairy products, such as low-fat or nonfat milk, cheese, and yogurt.  Dark green leafy vegetables, such as bok choy and broccoli.  Calcium-fortified foods, such as orange juice, cereal, bread, soy beverages, and tofu products.  Nuts, such as almonds. Follow these recommended amounts for daily calcium intake:  Children, age 1-3: 700 mg.  Children, age 4-8: 1,000 mg.  Children, age 9-13: 1,300 mg.  Teens, age 14-18: 1,300 mg.  Adults, age 19-50: 1,000 mg.  Adults, age 51-70:  Men: 1,000 mg.  Women: 1,200 mg.  Adults, age 71 or older: 1,200 mg.  Pregnant and breastfeeding females:  Teens: 1,300 mg.  Adults: 1,000 mg. Vitamin D Recommendations Vitamin D is the most essential vitamin for bone health. It helps the body to absorb calcium. Sunlight stimulates the skin to make vitamin D, so be sure to get enough sunlight. If you live in a cold climate or you do not get outside often, your health care provider may recommend that you take vitamin D supplements. Good   sources of vitamin D in your diet include:  Egg yolks.  Saltwater fish.  Milk and cereal fortified with vitamin D. Follow these recommended amounts for daily vitamin D intake:  Children and teens, age 1-18: 600 international units.  Adults, age 50 or younger: 400-800 international units.  Adults, age 51 or older: 800-1,000 international units. Other Nutrients Other nutrients for bone  health include:  Phosphorus. This mineral is found in meat, poultry, dairy foods, nuts, and legumes. The recommended daily intake for adult men and adult women is 700 mg.  Magnesium. This mineral is found in seeds, nuts, dark green vegetables, and legumes. The recommended daily intake for adult men is 400-420 mg. For adult women, it is 310-320 mg.  Vitamin K. This vitamin is found in green leafy vegetables. The recommended daily intake is 120 mg for adult men and 90 mg for adult women. WHAT TYPE OF PHYSICAL ACTIVITY IS BEST FOR BUILDING AND MAINTAINING HEALTHY BONES? Weight-bearing and strength-building activities are important for building and maintaining peak bone mass. Weight-bearing activities cause muscles and bones to work against gravity. Strength-building activities increases muscle strength that supports bones. Weight-bearing and muscle-building activities include:  Walking and hiking.  Jogging and running.  Dancing.  Gym exercises.  Lifting weights.  Tennis and racquetball.  Climbing stairs.  Aerobics. Adults should get at least 30 minutes of moderate physical activity on most days. Children should get at least 60 minutes of moderate physical activity on most days. Ask your health care provide what type of exercise is best for you. WHERE CAN I FIND MORE INFORMATION? For more information, check out the following websites:  National Osteoporosis Foundation: http://nof.org/learn/basics  National Institutes of Health: http://www.niams.nih.gov/Health_Info/Bone/Bone_Health/bone_health_for_life.asp   This information is not intended to replace advice given to you by your health care provider. Make sure you discuss any questions you have with your health care provider.   Document Released: 12/20/2003 Document Revised: 02/13/2015 Document Reviewed: 10/04/2014 [employer] Interactive Patient Education 2016 [employer]  

## 2016-05-14 ENCOUNTER — Other Ambulatory Visit: Payer: BC Managed Care – PPO

## 2016-05-15 LAB — LIPID PANEL
CHOL/HDL RATIO: 2.6 ratio (ref 0.0–4.4)
Cholesterol, Total: 143 mg/dL (ref 100–199)
HDL: 54 mg/dL (ref >39)
LDL CALC: 65 mg/dL (ref 0–99)
Triglycerides: 118 mg/dL (ref 0–149)
VLDL CHOLESTEROL CAL: 24 mg/dL (ref 5–40)

## 2016-05-15 LAB — CMP14+EGFR
ALBUMIN: 4.1 g/dL (ref 3.6–4.8)
ALT: 32 IU/L (ref 0–32)
AST: 27 IU/L (ref 0–40)
Albumin/Globulin Ratio: 1.5 (ref 1.2–2.2)
Alkaline Phosphatase: 93 IU/L (ref 39–117)
BUN / CREAT RATIO: 19 (ref 12–28)
BUN: 16 mg/dL (ref 8–27)
Bilirubin Total: 0.7 mg/dL (ref 0.0–1.2)
CALCIUM: 9.4 mg/dL (ref 8.7–10.3)
CHLORIDE: 102 mmol/L (ref 96–106)
CO2: 25 mmol/L (ref 18–29)
CREATININE: 0.85 mg/dL (ref 0.57–1.00)
GFR, EST AFRICAN AMERICAN: 85 mL/min/{1.73_m2} (ref >59)
GFR, EST NON AFRICAN AMERICAN: 74 mL/min/{1.73_m2} (ref >59)
GLUCOSE: 91 mg/dL (ref 65–99)
Globulin, Total: 2.7 g/dL (ref 1.5–4.5)
Potassium: 4.1 mmol/L (ref 3.5–5.2)
Sodium: 142 mmol/L (ref 134–144)
TOTAL PROTEIN: 6.8 g/dL (ref 6.0–8.5)

## 2016-05-19 ENCOUNTER — Other Ambulatory Visit: Payer: Self-pay | Admitting: Nurse Practitioner

## 2016-05-19 ENCOUNTER — Other Ambulatory Visit: Payer: BC Managed Care – PPO

## 2016-05-19 DIAGNOSIS — Z1212 Encounter for screening for malignant neoplasm of rectum: Secondary | ICD-10-CM

## 2016-05-20 ENCOUNTER — Other Ambulatory Visit: Payer: Self-pay | Admitting: Nurse Practitioner

## 2016-05-20 LAB — THYROID PANEL WITH TSH
FREE THYROXINE INDEX: 3 (ref 1.2–4.9)
T3 Uptake Ratio: 29 % (ref 24–39)
T4, Total: 10.4 ug/dL (ref 4.5–12.0)
TSH: 0.245 u[IU]/mL — AB (ref 0.450–4.500)

## 2016-05-20 LAB — SPECIMEN STATUS REPORT

## 2016-05-20 MED ORDER — LEVOTHYROXINE SODIUM 88 MCG PO TABS: 88.0000 ug | ORAL_TABLET | Freq: Every day | ORAL | 1 refills | Status: DC

## 2016-05-21 ENCOUNTER — Telehealth: Payer: Self-pay | Admitting: Nurse Practitioner

## 2016-05-21 LAB — FECAL OCCULT BLOOD, IMMUNOCHEMICAL: FECAL OCCULT BLD: NEGATIVE

## 2016-05-21 NOTE — Telephone Encounter (Signed)
Patient aware of lab results. Patient would like to know if she needs to come back in 6 weeks to have her TSH checked where her medication is getting decreased? Advised patient you are out of the office until Monday 8/14. Patient verbalizes understanding. Please advise.

## 2016-05-22 NOTE — Telephone Encounter (Signed)
PT can follow up in 6-8 weeks to have blood work retested

## 2016-05-22 NOTE — Telephone Encounter (Signed)
Left detailed message for pt 

## 2016-06-12 ENCOUNTER — Ambulatory Visit (INDEPENDENT_AMBULATORY_CARE_PROVIDER_SITE_OTHER): Payer: BC Managed Care – PPO | Admitting: Ophthalmology

## 2016-06-12 DIAGNOSIS — H2513 Age-related nuclear cataract, bilateral: Secondary | ICD-10-CM | POA: Diagnosis not present

## 2016-06-12 DIAGNOSIS — H33303 Unspecified retinal break, bilateral: Secondary | ICD-10-CM

## 2016-06-12 DIAGNOSIS — H35413 Lattice degeneration of retina, bilateral: Secondary | ICD-10-CM

## 2016-06-12 DIAGNOSIS — H43813 Vitreous degeneration, bilateral: Secondary | ICD-10-CM

## 2016-06-12 DIAGNOSIS — H35372 Puckering of macula, left eye: Secondary | ICD-10-CM | POA: Diagnosis not present

## 2016-06-27 ENCOUNTER — Encounter: Payer: Self-pay | Admitting: Nurse Practitioner

## 2016-06-27 ENCOUNTER — Ambulatory Visit (INDEPENDENT_AMBULATORY_CARE_PROVIDER_SITE_OTHER): Payer: BC Managed Care – PPO | Admitting: Nurse Practitioner

## 2016-06-27 VITALS — BP 115/73 | HR 91 | Temp 97.9°F | Ht 65.0 in | Wt 136.0 lb

## 2016-06-27 DIAGNOSIS — J029 Acute pharyngitis, unspecified: Secondary | ICD-10-CM

## 2016-06-27 LAB — CULTURE, GROUP A STREP

## 2016-06-27 LAB — RAPID STREP SCREEN (MED CTR MEBANE ONLY): STREP GP A AG, IA W/REFLEX: NEGATIVE

## 2016-06-27 NOTE — Patient Instructions (Signed)

## 2016-06-27 NOTE — Progress Notes (Signed)
Subjective:     [name] [name] is a 65 y.o. female who presents for evaluation of sore throat. Associated symptoms include headache, nasal blockage, post nasal drip, sinus and nasal congestion and sore throat. Onset of symptoms was 1 day ago, and have been gradually worsening since that time. She is drinking plenty of fluids. She has not had a recent close exposure to someone with proven streptococcal pharyngitis.  The following portions of the patient's history were reviewed and updated as appropriate: allergies, current medications, past family history, past medical history, past social history, past surgical history and problem list.  Review of Systems Pertinent items noted in HPI and remainder of comprehensive ROS otherwise negative.    Objective:    BP 115/73   Pulse 91   Temp 97.9 F (36.6 C) (Oral)   Ht 5\' 5"  (1.651 m)   Wt 136 lb (61.7 kg)   BMI 22.63 kg/m  General appearance: alert, cooperative and icteric Eyes: conjunctivae/corneas clear. PERRL, EOM's intact. Fundi benign. Ears: normal TM's and external ear canals both ears Nose: clear discharge, mild congestion, turbinates red Throat: lips, mucosa, and tongue normal; teeth and gums normal Lungs: clear to auscultation bilaterally Heart: regular rate and rhythm, S1, S2 normal, no murmur, click, rub or gallop  Laboratory Strep test done. Results:negative.    Assessment:    Acute pharyngitis, likely  Viral pharyngitis.    Plan:     Force fluids Motrin or tylenol OTC OTC decongestant Throat lozenges if help New toothbrush in 3 days  Mary-Margaret Hassell Done, FNP

## 2016-07-08 ENCOUNTER — Other Ambulatory Visit: Payer: Self-pay | Admitting: Nurse Practitioner

## 2016-07-08 ENCOUNTER — Other Ambulatory Visit: Payer: BC Managed Care – PPO

## 2016-07-08 DIAGNOSIS — E039 Hypothyroidism, unspecified: Secondary | ICD-10-CM

## 2016-07-09 LAB — THYROID PANEL WITH TSH
FREE THYROXINE INDEX: 2.4 (ref 1.2–4.9)
T3 Uptake Ratio: 28 % (ref 24–39)
T4 TOTAL: 8.6 ug/dL (ref 4.5–12.0)
TSH: 1.08 u[IU]/mL (ref 0.450–4.500)

## 2016-07-21 ENCOUNTER — Other Ambulatory Visit: Payer: Self-pay

## 2016-07-31 DIAGNOSIS — C4491 Basal cell carcinoma of skin, unspecified: Secondary | ICD-10-CM

## 2016-07-31 HISTORY — DX: Basal cell carcinoma of skin, unspecified: C44.91

## 2016-08-21 ENCOUNTER — Other Ambulatory Visit: Payer: Self-pay | Admitting: Obstetrics & Gynecology

## 2016-10-27 ENCOUNTER — Encounter: Payer: Self-pay | Admitting: Family Medicine

## 2016-10-27 ENCOUNTER — Other Ambulatory Visit: Payer: Self-pay | Admitting: Family Medicine

## 2016-10-27 ENCOUNTER — Ambulatory Visit (INDEPENDENT_AMBULATORY_CARE_PROVIDER_SITE_OTHER): Payer: BC Managed Care – PPO | Admitting: Family Medicine

## 2016-10-27 VITALS — BP 114/77 | HR 85 | Temp 97.5°F | Ht 65.0 in | Wt 138.6 lb

## 2016-10-27 DIAGNOSIS — E039 Hypothyroidism, unspecified: Secondary | ICD-10-CM | POA: Diagnosis not present

## 2016-10-27 DIAGNOSIS — M8580 Other specified disorders of bone density and structure, unspecified site: Secondary | ICD-10-CM | POA: Diagnosis not present

## 2016-10-27 DIAGNOSIS — N3001 Acute cystitis with hematuria: Secondary | ICD-10-CM | POA: Diagnosis not present

## 2016-10-27 DIAGNOSIS — E785 Hyperlipidemia, unspecified: Secondary | ICD-10-CM | POA: Diagnosis not present

## 2016-10-27 DIAGNOSIS — R7989 Other specified abnormal findings of blood chemistry: Secondary | ICD-10-CM

## 2016-10-27 DIAGNOSIS — Z0000 Encounter for general adult medical examination without abnormal findings: Secondary | ICD-10-CM

## 2016-10-27 LAB — MICROSCOPIC EXAMINATION: Renal Epithel, UA: NONE SEEN /hpf

## 2016-10-27 LAB — URINALYSIS, COMPLETE
BILIRUBIN UA: NEGATIVE
Glucose, UA: NEGATIVE
Nitrite, UA: NEGATIVE
Protein, UA: NEGATIVE
SPEC GRAV UA: 1.02 (ref 1.005–1.030)
UUROB: 0.2 mg/dL (ref 0.2–1.0)
pH, UA: 6 (ref 5.0–7.5)

## 2016-10-27 MED ORDER — CEPHALEXIN 500 MG PO CAPS: 500.0000 mg | ORAL_CAPSULE | Freq: Three times a day (TID) | ORAL | 0 refills | Status: DC

## 2016-10-27 NOTE — Patient Instructions (Signed)
Great to meet you!   We will send your labs on mychart or call within 1 week

## 2016-10-27 NOTE — Progress Notes (Addendum)
HPI  Patient presents today here for routine follow-up of chronic medical conditions.  Patient is fasting and requesting labs for low vitamin D, hypothyroidism, and hyperlipidemia.  Patient reports mild fatigue. She denies any other discrete symptoms.  She's planning to have a Pap smear this summer with her GYN.  She does complain of foul smelling urine for several weeks. She denies any dysuria, abdominal pain, back pain, fevers, chills, sweats.   PMH: Smoking status noted ROS: Per HPI  Objective: BP 114/77   Pulse 85   Temp 97.5 F (36.4 C) (Oral)   Ht 5' 5" (1.651 m)   Wt 138 lb 9.6 oz (62.9 kg)   BMI 23.06 kg/m  Gen: NAD, alert, cooperative with exam HEENT: NCAT CV: RRR, good S1/S2, no murmur Resp: CTABL, no wheezes, non-labored Abd: SNTND, BS present, no guarding or organomegaly Ext: No edema, warm Neuro: Alert and oriented, strength 4/5 in bilateral lower extremities  Assessment and plan:  # Hypothyroidism Patient with chronic Synthroid dependence and mild fatigue today. TSH ordered  # hyperlipidemia Tolerating statin well Fasting today, lipids  # Low serum vitamin D, osteopenia, HCM Reorder DEXA scan, continue 2000 units vitamin D3 daily Rechecking levels vitamin D  # foul smelling urine Urinalysis ordered, low threshold for culture     Orders Placed This Encounter  Procedures  . Lipid panel  . CBC with Differential/Platelet  . TSH  . CMP14+EGFR  . VITAMIN D 25 Hydroxy (Vit-D Deficiency, Fractures)    Meds ordered this encounter  Medications  . clobetasol ointment (TEMOVATE) 0.05 %    Sam Bradshaw, MD [facility] 10/27/2016, 9:05 AM   UA c/w UTI, Send for culture, treat with Keflex.   Sam Bradshaw, MD [facility] 10/27/2016, 12:23 PM     

## 2016-10-27 NOTE — Addendum Note (Signed)
Addended by: Timmothy Euler on: 10/27/2016 12:23 PM   Modules accepted: Orders

## 2016-10-28 LAB — CMP14+EGFR
A/G RATIO: 1.7 (ref 1.2–2.2)
ALT: 31 IU/L (ref 0–32)
AST: 17 IU/L (ref 0–40)
Albumin: 4.2 g/dL (ref 3.6–4.8)
Alkaline Phosphatase: 93 IU/L (ref 39–117)
BILIRUBIN TOTAL: 0.5 mg/dL (ref 0.0–1.2)
BUN/Creatinine Ratio: 21 (ref 12–28)
BUN: 19 mg/dL (ref 8–27)
CHLORIDE: 103 mmol/L (ref 96–106)
CO2: 26 mmol/L (ref 18–29)
Calcium: 9.2 mg/dL (ref 8.7–10.3)
Creatinine, Ser: 0.89 mg/dL (ref 0.57–1.00)
GFR calc non Af Amer: 69 mL/min/{1.73_m2} (ref >59)
GFR, EST AFRICAN AMERICAN: 80 mL/min/{1.73_m2} (ref >59)
Globulin, Total: 2.5 g/dL (ref 1.5–4.5)
Glucose: 88 mg/dL (ref 65–99)
POTASSIUM: 4.2 mmol/L (ref 3.5–5.2)
Sodium: 144 mmol/L (ref 134–144)
TOTAL PROTEIN: 6.7 g/dL (ref 6.0–8.5)

## 2016-10-28 LAB — TSH: TSH: 2.88 u[IU]/mL (ref 0.450–4.500)

## 2016-10-28 LAB — CBC WITH DIFFERENTIAL/PLATELET
BASOS ABS: 0 10*3/uL (ref 0.0–0.2)
Basos: 1 %
EOS (ABSOLUTE): 0.2 10*3/uL (ref 0.0–0.4)
Eos: 4 %
HEMOGLOBIN: 13.5 g/dL (ref 11.1–15.9)
Hematocrit: 39.6 % (ref 34.0–46.6)
Immature Grans (Abs): 0 10*3/uL (ref 0.0–0.1)
Immature Granulocytes: 0 %
LYMPHS ABS: 1.4 10*3/uL (ref 0.7–3.1)
Lymphs: 22 %
MCH: 29.6 pg (ref 26.6–33.0)
MCHC: 34.1 g/dL (ref 31.5–35.7)
MCV: 87 fL (ref 79–97)
MONOCYTES: 10 %
MONOS ABS: 0.6 10*3/uL (ref 0.1–0.9)
NEUTROS ABS: 4.1 10*3/uL (ref 1.4–7.0)
Neutrophils: 63 %
Platelets: 305 10*3/uL (ref 150–379)
RBC: 4.56 x10E6/uL (ref 3.77–5.28)
RDW: 13.3 % (ref 12.3–15.4)
WBC: 6.4 10*3/uL (ref 3.4–10.8)

## 2016-10-28 LAB — LIPID PANEL
CHOL/HDL RATIO: 2.9 ratio (ref 0.0–4.4)
Cholesterol, Total: 140 mg/dL (ref 100–199)
HDL: 49 mg/dL (ref >39)
LDL CALC: 69 mg/dL (ref 0–99)
Triglycerides: 108 mg/dL (ref 0–149)
VLDL CHOLESTEROL CAL: 22 mg/dL (ref 5–40)

## 2016-10-28 LAB — VITAMIN D 25 HYDROXY (VIT D DEFICIENCY, FRACTURES): Vit D, 25-Hydroxy: 34.8 ng/mL (ref 30.0–100.0)

## 2016-10-29 LAB — URINE CULTURE: ORGANISM ID, BACTERIA: NO GROWTH

## 2016-10-30 ENCOUNTER — Telehealth: Payer: Self-pay | Admitting: *Deleted

## 2016-11-19 NOTE — Progress Notes (Signed)
Pt does not want to have this done at this time

## 2016-11-20 ENCOUNTER — Other Ambulatory Visit: Payer: Self-pay | Admitting: Nurse Practitioner

## 2017-01-04 ENCOUNTER — Other Ambulatory Visit: Payer: Self-pay | Admitting: Nurse Practitioner

## 2017-01-04 DIAGNOSIS — E785 Hyperlipidemia, unspecified: Secondary | ICD-10-CM

## 2017-01-08 ENCOUNTER — Other Ambulatory Visit (HOSPITAL_COMMUNITY)
Admission: RE | Admit: 2017-01-08 | Discharge: 2017-01-08 | Disposition: A | Discharge status: Home / Self Care | Payer: BC Managed Care – PPO | Source: Ambulatory Visit | Attending: Obstetrics & Gynecology | Admitting: Obstetrics & Gynecology

## 2017-01-08 ENCOUNTER — Other Ambulatory Visit: Payer: Self-pay | Admitting: Obstetrics & Gynecology

## 2017-01-08 DIAGNOSIS — Z01419 Encounter for gynecological examination (general) (routine) without abnormal findings: Secondary | ICD-10-CM | POA: Diagnosis present

## 2017-01-08 DIAGNOSIS — Z1151 Encounter for screening for human papillomavirus (HPV): Secondary | ICD-10-CM | POA: Insufficient documentation

## 2017-01-13 LAB — CYTOLOGY - PAP
DIAGNOSIS: NEGATIVE
HPV (WINDOPATH): NOT DETECTED

## 2017-03-19 ENCOUNTER — Telehealth: Payer: Self-pay | Admitting: Nurse Practitioner

## 2017-03-20 NOTE — Telephone Encounter (Signed)
Appointment made and letter sent with date/time

## 2017-04-14 ENCOUNTER — Other Ambulatory Visit: Payer: Self-pay | Admitting: Family Medicine

## 2017-04-14 DIAGNOSIS — E785 Hyperlipidemia, unspecified: Secondary | ICD-10-CM

## 2017-04-29 NOTE — Telephone Encounter (Signed)
na

## 2017-05-18 ENCOUNTER — Ambulatory Visit (INDEPENDENT_AMBULATORY_CARE_PROVIDER_SITE_OTHER): Payer: BC Managed Care – PPO

## 2017-05-18 ENCOUNTER — Encounter: Payer: Self-pay | Admitting: Family Medicine

## 2017-05-18 ENCOUNTER — Ambulatory Visit (INDEPENDENT_AMBULATORY_CARE_PROVIDER_SITE_OTHER): Payer: BC Managed Care – PPO | Admitting: Family Medicine

## 2017-05-18 VITALS — BP 106/73 | HR 94 | Temp 98.7°F | Ht 65.0 in | Wt 138.2 lb

## 2017-05-18 DIAGNOSIS — M8588 Other specified disorders of bone density and structure, other site: Secondary | ICD-10-CM

## 2017-05-18 DIAGNOSIS — E039 Hypothyroidism, unspecified: Secondary | ICD-10-CM

## 2017-05-18 DIAGNOSIS — R8290 Unspecified abnormal findings in urine: Secondary | ICD-10-CM

## 2017-05-18 DIAGNOSIS — E785 Hyperlipidemia, unspecified: Secondary | ICD-10-CM | POA: Diagnosis not present

## 2017-05-18 LAB — MICROSCOPIC EXAMINATION: RENAL EPITHEL UA: NONE SEEN /HPF

## 2017-05-18 LAB — URINALYSIS, COMPLETE
BILIRUBIN UA: NEGATIVE
Glucose, UA: NEGATIVE
Nitrite, UA: NEGATIVE
PH UA: 5 (ref 5.0–7.5)
Protein, UA: NEGATIVE
RBC UA: NEGATIVE
Specific Gravity, UA: >1.03 — ABNORMAL HIGH (ref 1.005–1.030)
UUROB: 0.2 mg/dL (ref 0.2–1.0)

## 2017-05-18 NOTE — Progress Notes (Signed)
   HPI  Patient presents today here for routine follow-up and to discuss abnormal urine odor.  Patient's plain she continues to have foul-smelling urine. She did not have a change of Keflex. Denies any dysuria, fever, chills, abdominal pain, or back pain. She states she only drinks about one glass of tea a day, she drinks very little at meals. She does not drink water  Hyperlipidemia Good medication compliance with Lipitor. Tolerating Lipitor well.  Hypothyroidism Would like TSH checked Good medication compliance   PMH: Smoking status noted ROS: Per HPI  Objective: BP 106/73   Pulse 94   Temp 98.7 F (37.1 C) (Oral)   Ht '5\' 5"'$  (1.651 m)   Wt 138 lb 3.2 oz (62.7 kg)   BMI 23.00 kg/m  Gen: NAD, alert, cooperative with exam HEENT: NCAT CV: RRR, good S1/S2, no murmur Resp: CTABL, no wheezes, non-labored Ext: No edema, warm Neuro: Alert and oriented, No gross deficits  Assessment and plan:  # Hyperlipidemia LDL at goal, plan annual checks. Recheck CMP today  # Hypothyroidism Rechecking TSH, asymptomatic No changes at this time  # Abnormal urine odor Patient likely has very concentrated urine Work in 32 ounces extra water per day, only drinks about 16 ounces of tea a day her description Urine culture, patient will have to return for this with a very small volume today UA similar to last, will return for culture.    Orders Placed This Encounter  Procedures  . Urinalysis, Complete  . TSH  . [place], MD [facility] 05/18/2017, 9:24 AM

## 2017-05-18 NOTE — Patient Instructions (Signed)
Great to see yoU!  Lets have you return for a urine culture when it is convenient for you.

## 2017-05-19 LAB — CMP14+EGFR
A/G RATIO: 1.9 (ref 1.2–2.2)
ALT: 31 IU/L (ref 0–32)
AST: 19 IU/L (ref 0–40)
Albumin: 4.4 g/dL (ref 3.6–4.8)
Alkaline Phosphatase: 93 IU/L (ref 39–117)
BILIRUBIN TOTAL: 0.5 mg/dL (ref 0.0–1.2)
BUN / CREAT RATIO: 20 (ref 12–28)
BUN: 17 mg/dL (ref 8–27)
CALCIUM: 9.6 mg/dL (ref 8.7–10.3)
CHLORIDE: 103 mmol/L (ref 96–106)
CO2: 23 mmol/L (ref 20–29)
Creatinine, Ser: 0.85 mg/dL (ref 0.57–1.00)
GFR, EST AFRICAN AMERICAN: 84 mL/min/{1.73_m2} (ref >59)
GFR, EST NON AFRICAN AMERICAN: 73 mL/min/{1.73_m2} (ref >59)
GLOBULIN, TOTAL: 2.3 g/dL (ref 1.5–4.5)
Glucose: 88 mg/dL (ref 65–99)
POTASSIUM: 4.4 mmol/L (ref 3.5–5.2)
SODIUM: 142 mmol/L (ref 134–144)
TOTAL PROTEIN: 6.7 g/dL (ref 6.0–8.5)

## 2017-05-19 LAB — TSH: TSH: 1.4 u[IU]/mL (ref 0.450–4.500)

## 2017-05-19 LAB — URINE CULTURE: Organism ID, Bacteria: NO GROWTH

## 2017-05-20 ENCOUNTER — Telehealth: Payer: Self-pay | Admitting: *Deleted

## 2017-05-20 NOTE — Telephone Encounter (Signed)
Patient notified of results. She is already taking calcium daily and will continue

## 2017-05-20 NOTE — Telephone Encounter (Signed)
Dexa shows osteopenia. No need for medications except for 1200 mg calcium daily, Vitamin D was adequate last check.   Laroy Apple, MD [facility] 05/20/2017, 10:07 AM

## 2017-05-20 NOTE — Telephone Encounter (Signed)
Patient requesting Dexa results. Please review and advise.

## 2017-06-17 ENCOUNTER — Ambulatory Visit (INDEPENDENT_AMBULATORY_CARE_PROVIDER_SITE_OTHER): Payer: BC Managed Care – PPO | Admitting: Ophthalmology

## 2017-06-17 DIAGNOSIS — H33303 Unspecified retinal break, bilateral: Secondary | ICD-10-CM | POA: Diagnosis not present

## 2017-06-17 DIAGNOSIS — H2513 Age-related nuclear cataract, bilateral: Secondary | ICD-10-CM

## 2017-06-17 DIAGNOSIS — H35372 Puckering of macula, left eye: Secondary | ICD-10-CM

## 2017-06-17 DIAGNOSIS — H43813 Vitreous degeneration, bilateral: Secondary | ICD-10-CM

## 2017-07-12 ENCOUNTER — Other Ambulatory Visit: Payer: Self-pay | Admitting: Nurse Practitioner

## 2017-07-12 DIAGNOSIS — E785 Hyperlipidemia, unspecified: Secondary | ICD-10-CM

## 2017-09-29 ENCOUNTER — Other Ambulatory Visit: Payer: Self-pay | Admitting: *Deleted

## 2017-09-29 MED ORDER — LEVOTHYROXINE SODIUM 88 MCG PO TABS: 88.0000 ug | ORAL_TABLET | Freq: Every day | ORAL | 2 refills | Status: DC

## 2017-10-08 ENCOUNTER — Other Ambulatory Visit: Payer: Self-pay | Admitting: Family Medicine

## 2017-10-08 DIAGNOSIS — E785 Hyperlipidemia, unspecified: Secondary | ICD-10-CM

## 2017-10-09 NOTE — Telephone Encounter (Signed)
Last lipid 10/17/16  Dr Wendi Snipes

## 2018-01-04 ENCOUNTER — Other Ambulatory Visit: Payer: Self-pay | Admitting: *Deleted

## 2018-01-04 DIAGNOSIS — E785 Hyperlipidemia, unspecified: Secondary | ICD-10-CM

## 2018-01-05 MED ORDER — ATORVASTATIN CALCIUM 40 MG PO TABS: 40.0000 mg | ORAL_TABLET | Freq: Every day | ORAL | 0 refills | Status: DC

## 2018-01-06 ENCOUNTER — Other Ambulatory Visit: Payer: Self-pay | Admitting: *Deleted

## 2018-01-06 DIAGNOSIS — E785 Hyperlipidemia, unspecified: Secondary | ICD-10-CM

## 2018-01-07 MED ORDER — ATORVASTATIN CALCIUM 40 MG PO TABS: 40.0000 mg | ORAL_TABLET | Freq: Every day | ORAL | 0 refills | Status: DC

## 2018-04-21 ENCOUNTER — Encounter: Payer: Self-pay | Admitting: Family Medicine

## 2018-04-21 ENCOUNTER — Ambulatory Visit: Payer: BC Managed Care – PPO | Admitting: Family Medicine

## 2018-04-21 VITALS — BP 129/86 | HR 87 | Temp 97.6°F | Ht 65.0 in | Wt 137.0 lb

## 2018-04-21 DIAGNOSIS — E039 Hypothyroidism, unspecified: Secondary | ICD-10-CM | POA: Diagnosis not present

## 2018-04-21 DIAGNOSIS — E785 Hyperlipidemia, unspecified: Secondary | ICD-10-CM | POA: Diagnosis not present

## 2018-04-21 DIAGNOSIS — F419 Anxiety disorder, unspecified: Secondary | ICD-10-CM | POA: Diagnosis not present

## 2018-04-21 MED ORDER — ESCITALOPRAM OXALATE 10 MG PO TABS: 10.0000 mg | ORAL_TABLET | Freq: Every day | ORAL | 2 refills | Status: DC

## 2018-04-21 MED ORDER — ATORVASTATIN CALCIUM 40 MG PO TABS: 40.0000 mg | ORAL_TABLET | Freq: Every day | ORAL | 3 refills | Status: DC

## 2018-04-21 NOTE — Patient Instructions (Addendum)
Great to see you!  Start escitalopram 1/2 pill/day for 2 weeks, then increase to 1 pill/day. Follow-up with Particia Nearing in 4 weeks.  Effects of the medication will not be fully seen for 4 to 8 weeks.  Glenard Haring may want to increase the dose to the full dose at 20 mg when you see her.

## 2018-04-21 NOTE — Progress Notes (Signed)
   HPI  Patient presents today here for follow-up chronic medical conditions as well as anxiety.  Hyperlipidemia Tolerating Lipitor well No side effects Fasting today.  Hypothyroidism Good medication compliance and tolerance. Asymptomatic.  Patient also complains of anxiety, this been going on for at least a year.  She was previously treated with lorazepam earlier in life and used it as needed. She has symptoms on a daily basis.  She also has some unique tooth clicking that she thinks may be a tick.  She has a difficult time controlling this at times. No SI.   PMH: Smoking status noted ROS: Per HPI  Objective: BP 129/86   Pulse 87   Temp 97.6 F (36.4 C) (Oral)   Ht '5\' 5"'$  (1.651 m)   Wt 137 lb (62.1 kg)   BMI 22.80 kg/m  Gen: NAD, alert, cooperative with exam HEENT: NCAT, EOMI, PERRL CV: RRR, good S1/S2, no murmur Resp: CTABL, no wheezes, non-labored Abd: SNTND, BS present, no guarding or organomegaly Ext: No edema, warm Neuro: Alert and oriented, No gross deficits  Assessment and plan:  #Hypothyroidism Asymptomatic, doing well Recheck TSH  #Hyperlipidemia Doing well on Lipitor, no changes Fasting labs today  #Anxiety Symptoms greater than 1 year, starting Lexapro Follow-up 4 weeks with new PCP Discussed SSRIs before benzodiazepines as new standard of care compared to when she was treated previously.    Orders Placed This Encounter  Procedures  . Lipid panel  . CMP14+EGFR  . CBC with Differential/Platelet  . TSH    Meds ordered this encounter  Medications  . atorvastatin (LIPITOR) 40 MG tablet    Sig: Take 1 tablet (40 mg total) by mouth daily.    Dispense:  90 tablet    Refill:  3  . escitalopram (LEXAPRO) 10 MG tablet    Sig: Take 1 tablet (10 mg total) by mouth daily.    Dispense:  30 tablet    Refill:  [place], MD [facility] 04/21/2018, 10:30 AM

## 2018-04-22 ENCOUNTER — Other Ambulatory Visit: Payer: Self-pay | Admitting: Family Medicine

## 2018-04-22 LAB — LIPID PANEL
Chol/HDL Ratio: 3.3 ratio (ref 0.0–4.4)
Cholesterol, Total: 156 mg/dL (ref 100–199)
HDL: 48 mg/dL (ref >39)
LDL Calculated: 87 mg/dL (ref 0–99)
Triglycerides: 107 mg/dL (ref 0–149)
VLDL Cholesterol Cal: 21 mg/dL (ref 5–40)

## 2018-04-22 LAB — CMP14+EGFR
ALT: 44 IU/L — ABNORMAL HIGH (ref 0–32)
AST: 26 IU/L (ref 0–40)
Albumin/Globulin Ratio: 1.7 (ref 1.2–2.2)
Albumin: 4.3 g/dL (ref 3.6–4.8)
Alkaline Phosphatase: 92 IU/L (ref 39–117)
BUN/Creatinine Ratio: 19 (ref 12–28)
BUN: 19 mg/dL (ref 8–27)
Bilirubin Total: 0.7 mg/dL (ref 0.0–1.2)
CO2: 26 mmol/L (ref 20–29)
Calcium: 9.5 mg/dL (ref 8.7–10.3)
Chloride: 100 mmol/L (ref 96–106)
Creatinine, Ser: 1.02 mg/dL — ABNORMAL HIGH (ref 0.57–1.00)
GFR calc Af Amer: 67 mL/min/{1.73_m2} (ref >59)
GFR calc non Af Amer: 58 mL/min/{1.73_m2} — ABNORMAL LOW (ref >59)
Globulin, Total: 2.5 g/dL (ref 1.5–4.5)
Glucose: 87 mg/dL (ref 65–99)
Potassium: 4.3 mmol/L (ref 3.5–5.2)
Sodium: 140 mmol/L (ref 134–144)
Total Protein: 6.8 g/dL (ref 6.0–8.5)

## 2018-04-22 LAB — CBC WITH DIFFERENTIAL/PLATELET
BASOS ABS: 0 10*3/uL (ref 0.0–0.2)
Basos: 1 %
EOS (ABSOLUTE): 0.1 10*3/uL (ref 0.0–0.4)
Eos: 2 %
Hematocrit: 40.3 % (ref 34.0–46.6)
Hemoglobin: 13.3 g/dL (ref 11.1–15.9)
Immature Grans (Abs): 0 10*3/uL (ref 0.0–0.1)
Immature Granulocytes: 0 %
LYMPHS: 25 %
Lymphocytes Absolute: 1.6 10*3/uL (ref 0.7–3.1)
MCH: 29.6 pg (ref 26.6–33.0)
MCHC: 33 g/dL (ref 31.5–35.7)
MCV: 90 fL (ref 79–97)
Monocytes Absolute: 0.6 10*3/uL (ref 0.1–0.9)
Monocytes: 9 %
Neutrophils Absolute: 4 10*3/uL (ref 1.4–7.0)
Neutrophils: 63 %
Platelets: 341 10*3/uL (ref 150–450)
RBC: 4.49 x10E6/uL (ref 3.77–5.28)
RDW: 13.5 % (ref 12.3–15.4)
WBC: 6.3 10*3/uL (ref 3.4–10.8)

## 2018-04-22 LAB — TSH: TSH: 5.02 u[IU]/mL — ABNORMAL HIGH (ref 0.450–4.500)

## 2018-04-22 MED ORDER — LEVOTHYROXINE SODIUM 100 MCG PO TABS: 100.0000 ug | ORAL_TABLET | Freq: Every day | ORAL | 3 refills | Status: DC

## 2018-05-13 ENCOUNTER — Other Ambulatory Visit: Payer: Self-pay | Admitting: Family Medicine

## 2018-05-19 ENCOUNTER — Encounter: Payer: Self-pay | Admitting: Physician Assistant

## 2018-05-19 ENCOUNTER — Ambulatory Visit: Payer: BC Managed Care – PPO | Admitting: Physician Assistant

## 2018-05-19 VITALS — BP 118/76 | HR 80 | Temp 99.1°F | Ht 65.0 in | Wt 136.6 lb

## 2018-05-19 DIAGNOSIS — E039 Hypothyroidism, unspecified: Secondary | ICD-10-CM | POA: Diagnosis not present

## 2018-05-19 DIAGNOSIS — N289 Disorder of kidney and ureter, unspecified: Secondary | ICD-10-CM | POA: Diagnosis not present

## 2018-05-19 LAB — URINALYSIS, COMPLETE
BILIRUBIN UA: NEGATIVE
Glucose, UA: NEGATIVE
Ketones, UA: NEGATIVE
Nitrite, UA: NEGATIVE
PH UA: 5 (ref 5.0–7.5)
PROTEIN UA: NEGATIVE
RBC UA: NEGATIVE
Specific Gravity, UA: 1.025 (ref 1.005–1.030)
Urobilinogen, Ur: 0.2 mg/dL (ref 0.2–1.0)

## 2018-05-19 LAB — MICROSCOPIC EXAMINATION
RBC, UA: NONE SEEN /hpf (ref 0–2)
Renal Epithel, UA: NONE SEEN /hpf

## 2018-05-19 MED ORDER — ESCITALOPRAM OXALATE 10 MG PO TABS: 10.0000 mg | ORAL_TABLET | Freq: Every day | ORAL | 3 refills | Status: DC

## 2018-05-20 ENCOUNTER — Other Ambulatory Visit: Payer: Self-pay | Admitting: Physician Assistant

## 2018-05-20 ENCOUNTER — Other Ambulatory Visit: Payer: Self-pay

## 2018-05-20 DIAGNOSIS — Z1239 Encounter for other screening for malignant neoplasm of breast: Secondary | ICD-10-CM

## 2018-05-20 LAB — MICROALBUMIN / CREATININE URINE RATIO
Creatinine, Urine: 155.7 mg/dL
Microalb/Creat Ratio: 3.9 mg/g creat (ref 0.0–30.0)
Microalbumin, Urine: 6 ug/mL

## 2018-05-20 LAB — CMP14+EGFR
A/G RATIO: 1.8 (ref 1.2–2.2)
ALBUMIN: 4.3 g/dL (ref 3.6–4.8)
ALT: 41 IU/L — ABNORMAL HIGH (ref 0–32)
AST: 26 IU/L (ref 0–40)
Alkaline Phosphatase: 90 IU/L (ref 39–117)
BILIRUBIN TOTAL: 0.5 mg/dL (ref 0.0–1.2)
BUN / CREAT RATIO: 16 (ref 12–28)
BUN: 15 mg/dL (ref 8–27)
CHLORIDE: 102 mmol/L (ref 96–106)
CO2: 24 mmol/L (ref 20–29)
Calcium: 9.3 mg/dL (ref 8.7–10.3)
Creatinine, Ser: 0.91 mg/dL (ref 0.57–1.00)
GFR calc non Af Amer: 67 mL/min/{1.73_m2} (ref >59)
GFR, EST AFRICAN AMERICAN: 77 mL/min/{1.73_m2} (ref >59)
Globulin, Total: 2.4 g/dL (ref 1.5–4.5)
Glucose: 86 mg/dL (ref 65–99)
Potassium: 4.4 mmol/L (ref 3.5–5.2)
Sodium: 141 mmol/L (ref 134–144)
Total Protein: 6.7 g/dL (ref 6.0–8.5)

## 2018-05-20 LAB — TSH: TSH: 0.34 u[IU]/mL — AB (ref 0.450–4.500)

## 2018-05-20 NOTE — Progress Notes (Signed)
BP 118/76   Pulse 80   Temp 99.1 F (37.3 C) (Oral)   Ht '5\' 5"'  (1.651 m)   Wt 136 lb 9.6 oz (62 kg)   BMI 22.73 kg/m    Subjective:    Patient ID: [name] [name], female    DOB: 1951/06/15, 67 y.o.   MRN: [id number]  HPI: [name] [name] is a 67 y.o. female presenting on 05/19/2018 for Hypothyroidism (Repeat labs ) and Anxiety (4 week rck )  This patient comes in for periodic recheck on medications and conditions including hypothyroidism and anxiety.  She was started with Lexapro recently and is feeling much better.  Her PHQ is very good.  She is very content with the effect but it has had on her.  Her thyroid had to be adjusted and we will recheck the level on this.  Today.  And finally she had a slightly abnormal renal function and we will recheck that..   All medications are reviewed today. There are no reports of any problems with the medications. All of the medical conditions are reviewed and updated.  Lab work is reviewed and will be ordered as medically necessary. There are no new problems reported with today's visit.  Past Medical History:  Diagnosis Date  . Allergy   . History of cold sores   . Hyperlipidemia   . Osteopenia   . Rosacea   . Thyroid disease    hypothyroid   Relevant past medical, surgical, family and social history reviewed and updated as indicated. Interim medical history since our last visit reviewed. Allergies and medications reviewed and updated. DATA REVIEWED: CHART IN EPIC  Family History reviewed for pertinent findings.  Review of Systems  Constitutional: Negative.   HENT: Negative.   Eyes: Negative.   Respiratory: Negative.   Gastrointestinal: Negative.   Genitourinary: Negative.     Allergies as of 05/19/2018   No Known Allergies     Medication List        Accurate as of 05/19/18 11:59 PM. Always use your most recent med list.          atorvastatin 40 MG tablet Commonly known as:  LIPITOR Take 1 tablet (40 mg total) by mouth  daily.   clobetasol ointment 0.05 % Commonly known as:  TEMOVATE   escitalopram 10 MG tablet Commonly known as:  LEXAPRO Take 1 tablet (10 mg total) by mouth daily.   levothyroxine 100 MCG tablet Commonly known as:  SYNTHROID, LEVOTHROID Take 1 tablet (100 mcg total) by mouth daily before breakfast.   Vitamin D3 2000 units Tabs Take 1 tablet by mouth daily.          Objective:    BP 118/76   Pulse 80   Temp 99.1 F (37.3 C) (Oral)   Ht '5\' 5"'  (1.651 m)   Wt 136 lb 9.6 oz (62 kg)   BMI 22.73 kg/m   No Known Allergies  Wt Readings from Last 3 Encounters:  05/19/18 136 lb 9.6 oz (62 kg)  04/21/18 137 lb (62.1 kg)  05/18/17 138 lb 3.2 oz (62.7 kg)    Physical Exam  Constitutional: She is oriented to person, place, and time. She appears well-developed and well-nourished.  HENT:  Head: Normocephalic and atraumatic.  Eyes: Pupils are equal, round, and reactive to light. Conjunctivae and EOM are normal.  Cardiovascular: Normal rate, regular rhythm, normal heart sounds and intact distal pulses.  Pulmonary/Chest: Effort normal and breath sounds normal.  Abdominal: Soft. Bowel sounds are  normal.  Neurological: She is alert and oriented to person, place, and time. She has normal reflexes.  Skin: Skin is warm and dry. No rash noted.  Psychiatric: She has a normal mood and affect. Her behavior is normal. Judgment and thought content normal.    Results for orders placed or performed in visit on 05/19/18  Microscopic Examination  Result Value Ref Range   WBC, UA 6-10 (A) 0 - 5 /hpf   RBC, UA None seen 0 - 2 /hpf   Epithelial Cells (non renal) >10 (A) 0 - 10 /hpf   Renal Epithel, UA None seen None seen /hpf   Mucus, UA Present Not Estab.   Bacteria, UA [name] None seen/[name]  CMP14+EGFR  Result Value Ref Range   Glucose 86 65 - 99 mg/dL   BUN 15 8 - 27 mg/dL   Creatinine, Ser 0.91 0.57 - 1.00 mg/dL   GFR calc non Af Amer 67 >59 mL/min/1.73   GFR calc Af Amer 77 >59  mL/min/1.73   BUN/Creatinine Ratio 16 12 - 28   Sodium 141 134 - 144 mmol/L   Potassium 4.4 3.5 - 5.2 mmol/L   Chloride 102 96 - 106 mmol/L   CO2 24 20 - 29 mmol/L   Calcium 9.3 8.7 - 10.3 mg/dL   Total Protein 6.7 6.0 - 8.5 g/dL   Albumin 4.3 3.6 - 4.8 g/dL   Globulin, Total 2.4 1.5 - 4.5 g/dL   Albumin/Globulin Ratio 1.8 1.2 - 2.2   Bilirubin Total 0.5 0.0 - 1.2 mg/dL   Alkaline Phosphatase 90 39 - 117 IU/L   AST 26 0 - 40 IU/L   ALT 41 (H) 0 - 32 IU/L  Microalbumin / creatinine urine ratio  Result Value Ref Range   Creatinine, Urine 155.7 Not Estab. mg/dL   Microalbumin, Urine 6.0 Not Estab. ug/mL   Microalb/Creat Ratio 3.9 0.0 - 30.0 mg/g creat  TSH  Result Value Ref Range   TSH 0.340 (L) 0.450 - 4.500 uIU/mL  Urinalysis, Complete  Result Value Ref Range   Specific Gravity, UA 1.025 1.005 - 1.030   pH, UA 5.0 5.0 - 7.5   Color, UA Yellow Yellow   Appearance Ur Clear Clear   Leukocytes, UA 1+ (A) Negative   Protein, UA Negative Negative/Trace   Glucose, UA Negative Negative   Ketones, UA Negative Negative   RBC, UA Negative Negative   Bilirubin, UA Negative Negative   Urobilinogen, Ur 0.2 0.2 - 1.0 mg/dL   Nitrite, UA Negative Negative   Microscopic Examination See below:       Assessment & Plan:   1. Hypothyroidism (acquired) - TSH  2. Abnormal renal function - CMP14+EGFR - Microalbumin / creatinine urine ratio - Urinalysis, Complete   Continue all other maintenance medications as listed above.  Follow up plan: No follow-ups on file.  Educational handout given for [place] PA-C [place] [address]  [place], [place] [address] [phone]   05/20/2018, 10:05 PM

## 2018-05-21 ENCOUNTER — Other Ambulatory Visit: Payer: Self-pay | Admitting: Physician Assistant

## 2018-05-21 ENCOUNTER — Other Ambulatory Visit: Payer: Self-pay | Admitting: *Deleted

## 2018-05-21 DIAGNOSIS — E039 Hypothyroidism, unspecified: Secondary | ICD-10-CM

## 2018-05-21 MED ORDER — LEVOTHYROXINE SODIUM 88 MCG PO TABS: 88.0000 ug | ORAL_TABLET | Freq: Every day | ORAL | 0 refills | Status: DC

## 2018-06-16 ENCOUNTER — Ambulatory Visit: Payer: BC Managed Care – PPO | Admitting: Physician Assistant

## 2018-06-16 ENCOUNTER — Encounter: Payer: Self-pay | Admitting: Physician Assistant

## 2018-06-16 VITALS — BP 104/70 | HR 94 | Temp 99.0°F | Ht 65.0 in | Wt 133.0 lb

## 2018-06-16 DIAGNOSIS — J029 Acute pharyngitis, unspecified: Secondary | ICD-10-CM | POA: Diagnosis not present

## 2018-06-16 LAB — CULTURE, GROUP A STREP

## 2018-06-16 LAB — RAPID STREP SCREEN (MED CTR MEBANE ONLY): STREP GP A AG, IA W/REFLEX: NEGATIVE

## 2018-06-16 MED ORDER — AMOXICILLIN 500 MG PO CAPS: 500.0000 mg | ORAL_CAPSULE | Freq: Three times a day (TID) | ORAL | 0 refills | Status: DC

## 2018-06-21 ENCOUNTER — Encounter (INDEPENDENT_AMBULATORY_CARE_PROVIDER_SITE_OTHER): Payer: BC Managed Care – PPO | Admitting: Ophthalmology

## 2018-06-21 NOTE — Progress Notes (Signed)
BP 104/70   Pulse 94   Temp 99 F (37.2 C) (Oral)   Ht 5\' 5"  (1.651 m)   Wt 133 lb (60.3 kg)   BMI 22.13 kg/m    Subjective:    Patient ID: [name] [name], female    DOB: 1951-04-06, 67 y.o.   MRN: [id number]  HPI: [name] [name] is a 67 y.o. female presenting on 06/16/2018 for Sore Throat (Since Sunday )  This patient has sore throat and had it for more than 5  days severe fever, chills, myalgias.  Complains of sinus headache and postnasal drainage. There is copious drainage at times. Pain with swallowing, decreased appetite and headache.  Exposure to strep.   Past Medical History:  Diagnosis Date  . Allergy   . History of cold sores   . Hyperlipidemia   . Osteopenia   . Rosacea   . Thyroid disease    hypothyroid   Relevant past medical, surgical, family and social history reviewed and updated as indicated. Interim medical history since our last visit reviewed. Allergies and medications reviewed and updated. DATA REVIEWED: CHART IN EPIC  Family History reviewed for pertinent findings.  Review of Systems  Constitutional: Positive for chills and fatigue. Negative for activity change, appetite change and fever.  HENT: Positive for congestion, postnasal drip and sore throat. Negative for sinus pain.   Eyes: Negative.   Respiratory: Negative for cough and wheezing.   Cardiovascular: Negative.  Negative for chest pain, palpitations and leg swelling.  Gastrointestinal: Negative.   Genitourinary: Negative.   Musculoskeletal: Negative.   Skin: Negative.   Neurological: Positive for headaches.    Allergies as of 06/16/2018   No Known Allergies     Medication List        Accurate as of 06/16/18 11:59 PM. Always use your most recent med list.          amoxicillin 500 MG capsule Commonly known as:  AMOXIL Take 1 capsule (500 mg total) by mouth 3 (three) times daily.   atorvastatin 40 MG tablet Commonly known as:  LIPITOR Take 1 tablet (40 mg total) by mouth daily.     clobetasol ointment 0.05 % Commonly known as:  TEMOVATE   escitalopram 10 MG tablet Commonly known as:  LEXAPRO Take 1 tablet (10 mg total) by mouth daily.   levothyroxine 88 MCG tablet Commonly known as:  SYNTHROID, LEVOTHROID Take 1 tablet (88 mcg total) by mouth daily before breakfast.   Vitamin D3 2000 units Tabs Take 1 tablet by mouth daily.          Objective:    BP 104/70   Pulse 94   Temp 99 F (37.2 C) (Oral)   Ht 5\' 5"  (1.651 m)   Wt 133 lb (60.3 kg)   BMI 22.13 kg/m   No Known Allergies  Wt Readings from Last 3 Encounters:  06/16/18 133 lb (60.3 kg)  05/19/18 136 lb 9.6 oz (62 kg)  04/21/18 137 lb (62.1 kg)    Physical Exam  Constitutional: She is oriented to person, place, and time. She appears well-developed and well-nourished.  HENT:  Head: Normocephalic and atraumatic.  Right Ear: A middle ear effusion is present.  Left Ear: A middle ear effusion is present.  Nose: Mucosal edema present. Right sinus exhibits no frontal sinus tenderness. Left sinus exhibits no frontal sinus tenderness.  Mouth/Throat: Posterior oropharyngeal erythema present. No oropharyngeal exudate or tonsillar abscesses. Tonsils are 1+ on the right. Tonsils are 1+ on the  left. No tonsillar exudate.  Eyes: Pupils are equal, round, and reactive to light. Conjunctivae and EOM are normal.  Neck: Normal range of motion.  Cardiovascular: Normal rate, regular rhythm, normal heart sounds and intact distal pulses.  Pulmonary/Chest: Effort normal and breath sounds normal.  Abdominal: Soft. Bowel sounds are normal.  Neurological: She is alert and oriented to person, place, and time. She has normal reflexes.  Skin: Skin is warm and dry. No rash noted.  Psychiatric: She has a normal mood and affect. Her behavior is normal. Judgment and thought content normal.  Nursing note and vitals reviewed.   Results for orders placed or performed in visit on 06/16/18  Rapid Strep Screen ([facility]  ONLY)  Result Value Ref Range   Strep Gp A Ag, IA W/Reflex Negative Negative  Culture, Group A Strep  Result Value Ref Range   Strep A Culture CANCELED       Assessment & Plan:   1. Sore throat - Rapid Strep Screen ([facility] ONLY)   Continue all other maintenance medications as listed above.  Follow up plan: Return if symptoms worsen or fail to improve.  Educational handout given for [place] PA-C [place] [address]  [place], [place] [address] [phone]   06/21/2018, 8:22 AM

## 2018-07-06 ENCOUNTER — Encounter (INDEPENDENT_AMBULATORY_CARE_PROVIDER_SITE_OTHER): Payer: BC Managed Care – PPO | Admitting: Ophthalmology

## 2018-07-14 ENCOUNTER — Other Ambulatory Visit: Payer: BC Managed Care – PPO

## 2018-07-14 DIAGNOSIS — E039 Hypothyroidism, unspecified: Secondary | ICD-10-CM

## 2018-07-15 LAB — TSH: TSH: 0.489 u[IU]/mL (ref 0.450–4.500)

## 2018-08-03 ENCOUNTER — Other Ambulatory Visit: Payer: Self-pay | Admitting: Physician Assistant

## 2018-08-22 ENCOUNTER — Other Ambulatory Visit: Payer: Self-pay

## 2018-08-22 ENCOUNTER — Observation Stay (HOSPITAL_COMMUNITY)
Admission: EM | Admit: 2018-08-22 | Discharge: 2018-08-23 | Disposition: A | Discharge status: Home / Self Care | Payer: BC Managed Care – PPO | Attending: Internal Medicine | Admitting: Internal Medicine

## 2018-08-22 ENCOUNTER — Emergency Department (HOSPITAL_COMMUNITY): Payer: BC Managed Care – PPO

## 2018-08-22 ENCOUNTER — Encounter (HOSPITAL_COMMUNITY): Payer: Self-pay

## 2018-08-22 DIAGNOSIS — E785 Hyperlipidemia, unspecified: Secondary | ICD-10-CM | POA: Diagnosis present

## 2018-08-22 DIAGNOSIS — E782 Mixed hyperlipidemia: Secondary | ICD-10-CM | POA: Diagnosis not present

## 2018-08-22 DIAGNOSIS — R0789 Other chest pain: Secondary | ICD-10-CM | POA: Insufficient documentation

## 2018-08-22 DIAGNOSIS — J209 Acute bronchitis, unspecified: Secondary | ICD-10-CM | POA: Insufficient documentation

## 2018-08-22 DIAGNOSIS — Z79899 Other long term (current) drug therapy: Secondary | ICD-10-CM | POA: Insufficient documentation

## 2018-08-22 DIAGNOSIS — K219 Gastro-esophageal reflux disease without esophagitis: Secondary | ICD-10-CM | POA: Insufficient documentation

## 2018-08-22 DIAGNOSIS — I959 Hypotension, unspecified: Secondary | ICD-10-CM | POA: Insufficient documentation

## 2018-08-22 DIAGNOSIS — R072 Precordial pain: Secondary | ICD-10-CM

## 2018-08-22 DIAGNOSIS — R55 Syncope and collapse: Secondary | ICD-10-CM

## 2018-08-22 DIAGNOSIS — R112 Nausea with vomiting, unspecified: Secondary | ICD-10-CM

## 2018-08-22 DIAGNOSIS — E039 Hypothyroidism, unspecified: Secondary | ICD-10-CM | POA: Diagnosis not present

## 2018-08-22 DIAGNOSIS — R079 Chest pain, unspecified: Secondary | ICD-10-CM

## 2018-08-22 DIAGNOSIS — J069 Acute upper respiratory infection, unspecified: Secondary | ICD-10-CM

## 2018-08-22 HISTORY — DX: Anxiety disorder, unspecified: F41.9

## 2018-08-22 HISTORY — DX: Syncope and collapse: R55

## 2018-08-22 LAB — COMPREHENSIVE METABOLIC PANEL
ALBUMIN: 3.5 g/dL (ref 3.5–5.0)
ALK PHOS: 77 U/L (ref 38–126)
ALT: 34 U/L (ref 0–44)
AST: 24 U/L (ref 15–41)
Anion gap: 5 (ref 5–15)
BILIRUBIN TOTAL: 1 mg/dL (ref 0.3–1.2)
BUN: 15 mg/dL (ref 8–23)
CALCIUM: 8.2 mg/dL — AB (ref 8.9–10.3)
CO2: 27 mmol/L (ref 22–32)
Chloride: 107 mmol/L (ref 98–111)
Creatinine, Ser: 0.72 mg/dL (ref 0.44–1.00)
GFR calc Af Amer: >60 mL/min (ref >60)
GLUCOSE: 110 mg/dL — AB (ref 70–99)
Potassium: 3.9 mmol/L (ref 3.5–5.1)
Sodium: 139 mmol/L (ref 135–145)
TOTAL PROTEIN: 6.4 g/dL — AB (ref 6.5–8.1)

## 2018-08-22 LAB — URINALYSIS, ROUTINE W REFLEX MICROSCOPIC
Bilirubin Urine: NEGATIVE
GLUCOSE, UA: NEGATIVE mg/dL
HGB URINE DIPSTICK: NEGATIVE
Ketones, ur: NEGATIVE mg/dL
Leukocytes, UA: NEGATIVE
Nitrite: NEGATIVE
PH: 7 (ref 5.0–8.0)
PROTEIN: NEGATIVE mg/dL
SPECIFIC GRAVITY, URINE: 1.024 (ref 1.005–1.030)

## 2018-08-22 LAB — CBC
HEMATOCRIT: 39.1 % (ref 36.0–46.0)
Hemoglobin: 12.3 g/dL (ref 12.0–15.0)
MCH: 28.3 pg (ref 26.0–34.0)
MCHC: 31.5 g/dL (ref 30.0–36.0)
MCV: 89.9 fL (ref 80.0–100.0)
PLATELETS: 216 10*3/uL (ref 150–400)
RBC: 4.35 MIL/uL (ref 3.87–5.11)
RDW: 12.3 % (ref 11.5–15.5)
WBC: 7 10*3/uL (ref 4.0–10.5)
nRBC: 0 % (ref 0.0–0.2)

## 2018-08-22 LAB — CBG MONITORING, ED: Glucose-Capillary: 86 mg/dL (ref 70–99)

## 2018-08-22 LAB — GROUP A STREP BY PCR: Group A Strep by PCR: NOT DETECTED

## 2018-08-22 LAB — TROPONIN I
Troponin I: <0.03 ng/mL (ref <0.03)
Troponin I: <0.03 ng/mL (ref <0.03)

## 2018-08-22 LAB — INFLUENZA PANEL BY PCR (TYPE A & B)
Influenza A By PCR: NEGATIVE
Influenza B By PCR: NEGATIVE

## 2018-08-22 LAB — LIPASE, BLOOD: Lipase: 26 U/L (ref 11–51)

## 2018-08-22 MED ORDER — ACETAMINOPHEN 325 MG PO TABS
650.0000 mg | ORAL_TABLET | Freq: Four times a day (QID) | ORAL | Status: DC | PRN
  Administered 2018-08-22: 650 mg via ORAL
  Filled 2018-08-22: qty 2

## 2018-08-22 MED ORDER — SODIUM CHLORIDE 0.9 % IV SOLN
INTRAVENOUS | Status: DC
  Administered 2018-08-22: 11:00:00 via INTRAVENOUS

## 2018-08-22 MED ORDER — ENOXAPARIN SODIUM 40 MG/0.4ML ~~LOC~~ SOLN
40.0000 mg | SUBCUTANEOUS | Status: DC
  Administered 2018-08-22: 40 mg via SUBCUTANEOUS
  Filled 2018-08-22: qty 0.4

## 2018-08-22 MED ORDER — POTASSIUM CHLORIDE IN NACL 20-0.9 MEQ/L-% IV SOLN
INTRAVENOUS | Status: DC
  Administered 2018-08-22 – 2018-08-23 (×3): via INTRAVENOUS
  Filled 2018-08-22: qty 1000

## 2018-08-22 MED ORDER — LEVOTHYROXINE SODIUM 88 MCG PO TABS
88.0000 ug | ORAL_TABLET | Freq: Every day | ORAL | Status: DC
  Administered 2018-08-23: 88 ug via ORAL
  Filled 2018-08-22 (×2): qty 1

## 2018-08-22 MED ORDER — ATORVASTATIN CALCIUM 40 MG PO TABS: 40.0000 mg | ORAL_TABLET | Freq: Every day | ORAL | Status: DC

## 2018-08-22 MED ORDER — GUAIFENESIN-DM 100-10 MG/5ML PO SYRP
5.0000 mL | ORAL_SOLUTION | ORAL | Status: DC | PRN
  Administered 2018-08-22 – 2018-08-23 (×2): 5 mL via ORAL
  Filled 2018-08-22 (×2): qty 5

## 2018-08-22 MED ORDER — SODIUM CHLORIDE 0.9 % IV BOLUS
500.0000 mL | Freq: Once | INTRAVENOUS | Status: AC
  Administered 2018-08-22: 500 mL via INTRAVENOUS

## 2018-08-22 MED ORDER — ATORVASTATIN CALCIUM 40 MG PO TABS
40.0000 mg | ORAL_TABLET | Freq: Every day | ORAL | Status: DC
  Administered 2018-08-22: 40 mg via ORAL
  Filled 2018-08-22: qty 1

## 2018-08-22 MED ORDER — VITAMIN D 25 MCG (1000 UNIT) PO TABS
2000.0000 [IU] | ORAL_TABLET | Freq: Every day | ORAL | Status: DC
  Administered 2018-08-23: 2000 [IU] via ORAL
  Filled 2018-08-22: qty 2

## 2018-08-22 NOTE — ED Provider Notes (Signed)
[facility] EMERGENCY DEPARTMENT Provider Note   CSN: [id number] Arrival date & time: 08/22/18  0932     History   Chief Complaint Chief Complaint  Patient presents with  . Loss of Consciousness    HPI [name] [name] is a 67 y.o. female.  HPI  Pt was seen at 1040.  Per pt, c/o gradual onset and resolution of one episode of chest "heaviness" and nausea that occurred this morning approximately 0800. Pt states she walked to her bathroom and had several episodes of N/V then "passed out." This was followed by several more episodes of N/V. Pt states her CP is resolved now but she feels "extremely weak." EMS states pt's "SBP 93" on scene.  Pt also states 2 weeks ago she woke up "staggering" and "lightheaded," which was followed by several episodes of diarrhea and generalized weakness. Pt states over the past several days, she has developed cough, sore throat, runny/stuffy nose and sinus congestion. Pt has not received her flu shot this season. Denies fevers, no palpitations, no SOB/cough, no back pain, no abd pain, no diarrhea today, no black or blood in stools or emesis, no focal motor weakness, no tingling/numbness in extremities, no slurred speech, no facial droop.    Past Medical History:  Diagnosis Date  . Allergy   . Anxiety   . History of cold sores   . Hyperlipidemia   . Osteopenia   . Rosacea   . Thyroid disease    hypothyroid    Patient Active Problem List   Diagnosis Date Noted  . Sore throat 06/16/2018  . Healthcare maintenance 10/27/2016  . Insomnia 10/02/2015  . Low serum vitamin D 08/16/2014  . Hypothyroidism (acquired) 02/03/2013  . Hyperlipidemia 02/03/2013  . Osteopenia 02/03/2013  . Rosacea 02/03/2013  . HSV-1 (herpes simplex virus 1) infection 02/03/2013    Past Surgical History:  Procedure Laterality Date  . TUBAL LIGATION    . WISDOM TOOTH EXTRACTION       OB History   None      Home Medications    Prior to Admission medications     Medication Sig Start Date End Date Taking? Authorizing Provider  atorvastatin (LIPITOR) 40 MG tablet Take 1 tablet (40 mg total) by mouth daily. 04/21/18  Yes Timmothy Euler, MD  Cholecalciferol (VITAMIN D3) 2000 UNITS TABS Take 1 tablet by mouth daily.     Yes [provider]  levothyroxine (SYNTHROID, LEVOTHROID) 88 MCG tablet TAKE 1 TABLET (88 MCG TOTAL) BY MOUTH DAILY BEFORE BREAKFAST. 08/03/18  Yes Terald Sleeper, PA-C  amoxicillin (AMOXIL) 500 MG capsule Take 1 capsule (500 mg total) by mouth 3 (three) times daily. Patient not taking: Reported on 08/22/2018 06/16/18   Terald Sleeper, PA-C  clobetasol ointment (TEMOVATE) 0.05 %  08/25/16   [provider]    Family History Family History  Problem Relation Age of Onset  . Glaucoma Mother   . Cerebral aneurysm Mother   . Cancer Father        LUNG  . Stroke Father   . Hypertension Father   . Heart attack Paternal Uncle   . Heart attack Maternal Uncle   . Colon cancer Neg Hx   . Stomach cancer Neg Hx   . Esophageal cancer Neg Hx     Social History Social History   Tobacco Use  . Smoking status: Never Smoker  . Smokeless tobacco: Never Used  Substance Use Topics  . Alcohol use: No  . Drug  use: No     Allergies   Patient has no known allergies.   Review of Systems Review of Systems ROS: Statement: All systems negative except as marked or noted in the HPI; Constitutional: Negative for fever and chills. ; ; Eyes: Negative for eye pain, redness and discharge. ; ; ENMT: Negative for ear pain, hoarseness, +nasal congestion, sinus pressure and sore throat. ; ; Cardiovascular: +CP. Negative for palpitations, diaphoresis, dyspnea and peripheral edema. ; ; Respiratory: Negative for cough, wheezing and stridor. ; ; Gastrointestinal: +N/V/D. Negative for abdominal pain, blood in stool, hematemesis, jaundice and rectal bleeding. . ; ; Genitourinary: Negative for dysuria, flank pain and hematuria. ; ;  Musculoskeletal: Negative for back pain and neck pain. Negative for swelling and trauma.; ; Skin: Negative for pruritus, rash, abrasions, blisters, bruising and skin lesion.; ; Neuro: Negative for headache and neck stiffness. Negative for weakness, altered level of consciousness, extremity weakness, paresthesias, involuntary movement, seizure and +lightheadedness, syncope.       Physical Exam Updated Vital Signs BP 92/67   Pulse 70   Temp 98 F (36.7 C) (Oral)   Resp 18   Ht 5\' 5"  (1.651 m)   Wt 59.9 kg   SpO2 100%   BMI 21.97 kg/m    Patient Vitals for the past 24 hrs:  BP Temp Temp src Pulse Resp SpO2 Height Weight  08/22/18 1300 108/90 - - 67 (!) 8 100 % - -  08/22/18 1230 (!) 100/59 - - 71 20 100 % - -  08/22/18 1200 91/77 - - 76 14 98 % - -  08/22/18 1130 92/67 - - 70 18 100 % - -  08/22/18 1100 109/60 - - 64 11 96 % - -  08/22/18 1030 114/66 - - 64 13 99 % - -  08/22/18 0951 - - - - - - 5\' 5"  (1.651 m) 59.9 kg  08/22/18 0950 105/64 98 F (36.7 C) Oral 61 18 100 % - -   11:21 Orthostatic Vital Signs FS  Orthostatic Lying   BP- Lying: 96/61   Pulse- Lying: 64       Orthostatic Sitting  BP- Sitting: 99/69   Pulse- Sitting: 72       Orthostatic Standing at 0 minutes  BP- Standing at 0 minutes: 108/71   Pulse- Standing at 0 minutes: 86      Physical Exam 1045: Physical examination:  Nursing notes reviewed; Vital signs and O2 SAT reviewed;  Constitutional: Well developed, Well nourished, In no acute distress; Head:  Normocephalic, atraumatic; Eyes: EOMI, PERRL, No scleral icterus; ENMT: Mouth and pharynx normal, Mucous membranes dry. Mouth and pharynx without lesions. No tonsillar exudates. No intra-oral edema. No submandibular or sublingual edema. No hoarse voice, no drooling, no stridor. No trismus.; Neck: Supple, Full range of motion, No lymphadenopathy; Cardiovascular: Regular rate and rhythm, No gallop; Respiratory: Breath sounds clear & equal bilaterally, No  wheezes.  Speaking full sentences with ease, Normal respiratory effort/excursion; Chest: Nontender, Movement normal; Abdomen: Soft, Nontender, Nondistended, Normal bowel sounds; Genitourinary: No CVA tenderness; Extremities: Peripheral pulses normal, No tenderness, No edema, No calf edema or asymmetry.; Neuro: AA&Ox3, Major CN grossly intact. No facial droop. Speech clear. No gross focal motor or sensory deficits in extremities.; Skin: Color normal, Warm, Dry.   ED Treatments / Results  Labs (all labs ordered are listed, but only abnormal results are displayed)   EKG EKG Interpretation  Date/Time:  Sunday August 22 2018 10:18:59 EST Ventricular Rate:  59  PR Interval:    QRS Duration: 92 QT Interval:  471 QTC Calculation: 467 R Axis:   73 Text Interpretation:  Sinus rhythm Low voltage, precordial leads Borderline T abnormalities, anterior leads No old tracing to compare Confirmed by Francine Graven [phone]) on 08/22/2018 10:53:43 AM   Radiology   Procedures Procedures (including critical care time)  Medications Ordered in ED Medications  0.9 %  sodium chloride infusion ( Intravenous New Bag/Given 08/22/18 1116)  sodium chloride 0.9 % bolus 500 mL (500 mLs Intravenous New Bag/Given 08/22/18 1208)     Initial Impression / Assessment and Plan / ED Course  I have reviewed the triage vital signs and the nursing notes.  Pertinent labs & imaging results that were available during my care of the patient were reviewed by me and considered in my medical decision making (see chart for details).  MDM Reviewed: previous chart, nursing note and vitals Reviewed previous: labs and ECG Interpretation: labs, ECG, x-ray and CT scan Total time providing critical care: 30-74 minutes. This excludes time spent performing separately reportable procedures and services. Consults: admitting MD   CRITICAL CARE Performed by: Francine Graven Total critical care time: 35 minutes Critical care  time was exclusive of separately billable procedures and treating other patients. Critical care was necessary to treat or prevent imminent or life-threatening deterioration. Critical care was time spent personally by me on the following activities: development of treatment plan with patient and/or surrogate as well as nursing, discussions with consultants, evaluation of patient's response to treatment, examination of patient, obtaining history from patient or surrogate, ordering and performing treatments and interventions, ordering and review of laboratory studies, ordering and review of radiographic studies, pulse oximetry and re-evaluation of patient's condition.    Results for orders placed or performed during the hospital encounter of 08/22/18  Group A Strep by PCR  Result Value Ref Range   Group A Strep by PCR NOT DETECTED NOT DETECTED  CBC  Result Value Ref Range   WBC 7.0 4.0 - 10.5 K/uL   RBC 4.35 3.87 - 5.11 MIL/uL   Hemoglobin 12.3 12.0 - 15.0 g/dL   HCT 39.1 36.0 - 46.0 %   MCV 89.9 80.0 - 100.0 fL   MCH 28.3 26.0 - 34.0 pg   MCHC 31.5 30.0 - 36.0 g/dL   RDW 12.3 11.5 - 15.5 %   Platelets 216 150 - 400 K/uL   nRBC 0.0 0.0 - 0.2 %  Urinalysis, Routine w reflex microscopic  Result Value Ref Range   Color, Urine YELLOW YELLOW   APPearance CLEAR CLEAR   Specific Gravity, Urine 1.024 1.005 - 1.030   pH 7.0 5.0 - 8.0   Glucose, UA NEGATIVE NEGATIVE mg/dL   Hgb urine dipstick NEGATIVE NEGATIVE   Bilirubin Urine NEGATIVE NEGATIVE   Ketones, ur NEGATIVE NEGATIVE mg/dL   Protein, ur NEGATIVE NEGATIVE mg/dL   Nitrite NEGATIVE NEGATIVE   Leukocytes, UA NEGATIVE NEGATIVE  Comprehensive metabolic panel  Result Value Ref Range   Sodium 139 135 - 145 mmol/L   Potassium 3.9 3.5 - 5.1 mmol/L   Chloride 107 98 - 111 mmol/L   CO2 27 22 - 32 mmol/L   Glucose, Bld 110 (H) 70 - 99 mg/dL   BUN 15 8 - 23 mg/dL   Creatinine, Ser 0.72 0.44 - 1.00 mg/dL   Calcium 8.2 (L) 8.9 - 10.3 mg/dL    Total Protein 6.4 (L) 6.5 - 8.1 g/dL   Albumin 3.5 3.5 - 5.0 g/dL  AST 24 15 - 41 U/L   ALT 34 0 - 44 U/L   Alkaline Phosphatase 77 38 - 126 U/L   Total Bilirubin 1.0 0.3 - 1.2 mg/dL   GFR calc non Af Amer >60 >60 mL/min   GFR calc Af Amer >60 >60 mL/min   Anion gap 5 5 - 15  Troponin I STAT - ONCE  Result Value Ref Range   Troponin I <0.03 <0.03 ng/mL  Lipase, blood  Result Value Ref Range   Lipase 26 11 - 51 U/L  Influenza panel by PCR (type A & B)  Result Value Ref Range   Influenza A By PCR NEGATIVE NEGATIVE   Influenza B By PCR NEGATIVE NEGATIVE  CBG monitoring, ED  Result Value Ref Range   Glucose-Capillary 86 70 - 99 mg/dL   Dg Chest 2 View Result Date: 08/22/2018 CLINICAL DATA:  Chest pain. Chest heaviness this morning. Syncopal episode. EXAM: CHEST - 2 VIEW COMPARISON:  None. FINDINGS: Cardiac silhouette is normal in size. No mediastinal or hilar masses. No evidence of adenopathy. Clear lungs.  No pleural effusion or pneumothorax. Skeletal structures are intact. IMPRESSION: No active cardiopulmonary disease. Electronically Signed   By: Lajean Manes M.D.   On: 08/22/2018 10:51   Ct Head Wo Contrast Result Date: 08/22/2018 CLINICAL DATA:  Vomiting.  Syncopal episode. EXAM: CT HEAD WITHOUT CONTRAST TECHNIQUE: Contiguous axial images were obtained from the base of the skull through the vertex without intravenous contrast. COMPARISON:  08/17/2005 MRI FINDINGS: Brain: The brain shows a normal appearance without evidence of malformation, atrophy, old or acute small or large vessel infarction, mass lesion, hemorrhage, hydrocephalus or extra-axial collection. Vascular: No hyperdense vessel. No evidence of atherosclerotic calcification. Skull: Normal.  No traumatic finding.  No focal bone lesion. Sinuses/Orbits: Sinuses are clear. Orbits appear normal. Mastoids are clear. Other: None significant IMPRESSION: Normal head CT Electronically Signed   By: Nelson Chimes M.D.   On: 08/22/2018  11:54    1325: Pt orthostatic on VS; IVF boluses given with slow improvement. No further N/V while in the ED. No further episodes of CP. Abd remains benign, resps easy. Dx and testing d/w pt and family.  Questions answered.  Verb understanding, agreeable to observation admit.  T/C returned from [facility] Dr. Carles Collet, case discussed, including:  HPI, pertinent PM/SHx, VS/PE, dx testing, ED course and treatment:  Agreeable to admit.     Final Clinical Impressions(s) / ED Diagnoses   Final diagnoses:  None    ED Discharge Orders    None       Francine Graven, DO 08/25/18 1542

## 2018-08-22 NOTE — ED Notes (Signed)
Pt assisted to BR.

## 2018-08-22 NOTE — ED Notes (Signed)
Meal provided 

## 2018-08-22 NOTE — ED Notes (Signed)
Pt reports felt sore throat on Friday and has felt badly since  Flu and strep swabs obtained and to the lab

## 2018-08-22 NOTE — H&P (Signed)
History and Physical  Christmas Faraci [id number]:[id number] DOB: [birth year] DOA: 08/22/2018   PCP: Terald Sleeper, PA-C   Patient coming from: Home  Chief Complaint: vomiting and syncope  HPI:  [name] [name] is a 67 y.o. female with medical history of anxiety, hypothyroidism, hyperlipidemia presenting with chest pain, nausea, vomiting, and resultant syncope.  The patient woke up eloped before 8 AM on 08/22/2018 with substernal chest pressure.  Subsequently, the patient felt nauseous and went to the bathroom to have a few episodes of emesis.  Her husband was helping her out, and noted the patient to lose consciousness after an episode of emesis.  The patient felt lightheaded while walking to the bathroom and prior to the emesis.  According to the patient's husband, the patient had lost consciousness for approximately 2 minutes.  She did not bite her tongue or have bowel or bladder incontinence.  After she woke up, the patient had another 2 episodes of emesis without any blood.  EMS was activated.  Apparently, the patient systolic blood pressure was low and the patient was given 500 cc normal saline bolus.  The patient has had a sore throat with some coughing and chest congestion since 08/20/2018.  She denies any frank fevers or chills.  She has not had any diarrhea, abdominal pain, dysuria, hematuria, headache, neck pain.  She denies any recent sick contacts or travels. Upon arrival to the emergency department, the patient was afebrile hemodynamically stable saturating 100% room air.  BMP, LFTs, and CBC were essentially unremarkable.  Influenza PCR was negative.  Urinalysis was negative for pyuria.  CT of the brain was negative for acute findings and clear sinuses.  The patient was started on IV fluids and admission was requested for the constellation of symptoms.  Assessment/Plan: Syncope -Likely vagal reaction -Orthostatic vital signs -Continue IV fluids -Place on  telemetry -Echocardiogram  Atypical chest pain -Echocardiogram -Cycle troponins -EKG without concerning ischemic changes  Nausea and vomiting -Suspect underlying viral infection -Patient is feeling better and wants to eat "real food"  Hypotension -Secondary to volume depletion -Continue IV fluids -Improving with IV fluids -Urinalysis negative for pyuria -Check lactic acid -Patient is afebrile without leukocytosis -Chest x-ray negative for infiltrates or edema  Hyperlipidemia -Continue statin  Hypothyroidism -Continue Synthroid        Past Medical History:  Diagnosis Date  . Allergy   . Anxiety   . History of cold sores   . Hyperlipidemia   . Osteopenia   . Rosacea   . Thyroid disease    hypothyroid   Past Surgical History:  Procedure Laterality Date  . TUBAL LIGATION    . WISDOM TOOTH EXTRACTION     Social History:  reports that she has never smoked. She has never used smokeless tobacco. She reports that she does not drink alcohol or use drugs.   Family History  Problem Relation Age of Onset  . Glaucoma Mother   . Cerebral aneurysm Mother   . Cancer Father        LUNG  . Stroke Father   . Hypertension Father   . Heart attack Paternal Uncle   . Heart attack Maternal Uncle   . Colon cancer Neg Hx   . Stomach cancer Neg Hx   . Esophageal cancer Neg Hx      No Known Allergies   Prior to Admission medications   Medication Sig Start Date End Date Taking? Authorizing Provider  atorvastatin (LIPITOR) 40 MG tablet Take  1 tablet (40 mg total) by mouth daily. 04/21/18  Yes Timmothy Euler, MD  Cholecalciferol (VITAMIN D3) 2000 UNITS TABS Take 1 tablet by mouth daily.     Yes [provider]  levothyroxine (SYNTHROID, LEVOTHROID) 88 MCG tablet TAKE 1 TABLET (88 MCG TOTAL) BY MOUTH DAILY BEFORE BREAKFAST. 08/03/18  Yes Terald Sleeper, PA-C  amoxicillin (AMOXIL) 500 MG capsule Take 1 capsule (500 mg total) by mouth 3 (three) times  daily. Patient not taking: Reported on 08/22/2018 06/16/18   Terald Sleeper, PA-C  clobetasol ointment (TEMOVATE) 0.05 %  08/25/16   [provider]    Review of Systems:  Constitutional:  No weight loss, night sweats, Fevers, chills, fatigue.  Head&Eyes: No headache.  No vision loss.  No eye pain or scotoma ENT:  No Difficulty swallowing,Tooth/dental problems,Sore throat,  No ear ache, post nasal drip,  Cardio-vascular:  No Orthopnea, PND, swelling in lower extremities,  dizziness, palpitations  GI:  No  abdominal pain, diarrhea, loss of appetite, hematochezia, melena, heartburn, indigestion, Resp:  No shortness of breath with exertion or at rest. No cough. No coughing up of blood .No wheezing.No chest wall deformity  Skin:  no rash or lesions.  GU:  no dysuria, change in color of urine, no urgency or frequency. No flank pain.  Musculoskeletal:  No joint pain or swelling. No decreased range of motion. No back pain.  Psych:  No change in mood or affect. No depression or anxiety. Neurologic: No headache, no dysesthesia, no focal weakness, no vision loss. No syncope  Physical Exam: Vitals:   08/22/18 1200 08/22/18 1230 08/22/18 1300 08/22/18 1330  BP: 91/77 (!) 100/59 108/90 94/60  Pulse: 76 71 67 67  Resp: 14 20 (!) 8 15  Temp:      TempSrc:      SpO2: 98% 100% 100% 100%  Weight:      Height:       General:  A&O x 3, NAD, nontoxic, pleasant/cooperative Head/Eye: No conjunctival hemorrhage, no icterus, [place]/AT, No nystagmus ENT:  No icterus,  No thrush, good dentition, no pharyngeal exudate Neck:  No masses, no lymphadenpathy, no bruits CV:  RRR, no rub, no gallop, no S3 Lung:  CTAB, good air movement, no wheeze, no rhonchi Abdomen: soft/NT, +BS, nondistended, no peritoneal signs Ext: No cyanosis, No rashes, No petechiae, No lymphangitis, No edema Neuro: CNII-XII intact, strength 4/5 in bilateral upper and lower extremities, no dysmetria  Labs on Admission:   Basic Metabolic Panel: Recent Labs  Lab 08/22/18 1021  NA 139  K 3.9  CL 107  CO2 27  GLUCOSE 110*  BUN 15  CREATININE 0.72  CALCIUM 8.2*   Liver Function Tests: Recent Labs  Lab 08/22/18 1021  AST 24  ALT 34  ALKPHOS 77  BILITOT 1.0  PROT 6.4*  ALBUMIN 3.5   Recent Labs  Lab 08/22/18 1021  LIPASE 26   No results for input(s): AMMONIA in the last 168 hours. CBC: Recent Labs  Lab 08/22/18 1021  WBC 7.0  HGB 12.3  HCT 39.1  MCV 89.9  PLT 216   Coagulation Profile: No results for input(s): INR, PROTIME in the last 168 hours. Cardiac Enzymes: Recent Labs  Lab 08/22/18 1021  TROPONINI <0.03   BNP: Invalid input(s): POCBNP CBG: Recent Labs  Lab 08/22/18 1023  GLUCAP 86   Urine analysis:    Component Value Date/Time   COLORURINE YELLOW 08/22/2018 [place] 08/22/2018 1121  APPEARANCEUR Clear 05/19/2018 1051   LABSPEC 1.024 08/22/2018 1121   PHURINE 7.0 08/22/2018 1121   GLUCOSEU NEGATIVE 08/22/2018 1121   HGBUR NEGATIVE 08/22/2018 1121   BILIRUBINUR NEGATIVE 08/22/2018 1121   BILIRUBINUR Negative 05/19/2018 [place] 08/22/2018 1121   PROTEINUR NEGATIVE 08/22/2018 1121   NITRITE NEGATIVE 08/22/2018 1121   LEUKOCYTESUR NEGATIVE 08/22/2018 1121   LEUKOCYTESUR 1+ (A) 05/19/2018 1051   Sepsis Labs: @LABRCNTIP (procalcitonin:4,lacticidven:4) ) Recent Results (from the past 240 hour(s))  Group A Strep by PCR     Status: None   Collection Time: 08/22/18 10:47 AM  Result Value Ref Range Status   Group A Strep by PCR NOT DETECTED NOT DETECTED Final    Comment: Performed at [facility], [address] [place], [place] [address]     Radiological Exams on Admission: Dg Chest 2 View  Result Date: 08/22/2018 CLINICAL DATA:  Chest pain. Chest heaviness this morning. Syncopal episode. EXAM: CHEST - 2 VIEW COMPARISON:  None. FINDINGS: Cardiac silhouette is normal in size. No mediastinal or hilar masses. No evidence of  adenopathy. Clear lungs.  No pleural effusion or pneumothorax. Skeletal structures are intact. IMPRESSION: No active cardiopulmonary disease. Electronically Signed   By: Lajean Manes M.D.   On: 08/22/2018 10:51   Ct Head Wo Contrast  Result Date: 08/22/2018 CLINICAL DATA:  Vomiting.  Syncopal episode. EXAM: CT HEAD WITHOUT CONTRAST TECHNIQUE: Contiguous axial images were obtained from the base of the skull through the vertex without intravenous contrast. COMPARISON:  08/17/2005 MRI FINDINGS: Brain: The brain shows a normal appearance without evidence of malformation, atrophy, old or acute small or large vessel infarction, mass lesion, hemorrhage, hydrocephalus or extra-axial collection. Vascular: No hyperdense vessel. No evidence of atherosclerotic calcification. Skull: Normal.  No traumatic finding.  No focal bone lesion. Sinuses/Orbits: Sinuses are clear. Orbits appear normal. Mastoids are clear. Other: None significant IMPRESSION: Normal head CT Electronically Signed   By: Nelson Chimes M.D.   On: 08/22/2018 11:54    EKG: Independently reviewed.  Sinus rhythm, nonspecific T wave changes    Time spent:60 minutes Code Status:   FULL Family Communication: Spouse updated at bedside Disposition Plan: expect 1 day hospitalization Consults called: none DVT Prophylaxis: [place] Lovenox  Orson Eva, DO  [facility] Pager [phone]  If 7PM-7AM, please contact night-coverage [email or web address] Password [email or web address] 08/22/2018, 1:55 PM

## 2018-08-22 NOTE — ED Notes (Signed)
Pt ambulated to restroom. 

## 2018-08-22 NOTE — ED Triage Notes (Signed)
Pt reports 2 weeks ago pt  woke up and was "staggering".  Reports she had an episode of diarrhea and became nauseated and weak.  Pt says eventually the symptoms passed and she felt ok but has had some intermittent lightheadedness.  Friday pt started having sore throat, cough, and runny nose.  Has been taking mucinex and hydrocodone cough syrup.  PT says she got up and took more mucinex and cough syrup this morning and shortly afterwards, pt had a heavy pressure in chest and was nauseated.  Pt went to the bathroom and passed out while vomiting.    EMS says pts bp was 93/52 and they gave a 568ml fluid bolus.  EMS also gave 4mg  zofran.

## 2018-08-23 ENCOUNTER — Observation Stay (HOSPITAL_BASED_OUTPATIENT_CLINIC_OR_DEPARTMENT_OTHER): Payer: BC Managed Care – PPO

## 2018-08-23 DIAGNOSIS — R112 Nausea with vomiting, unspecified: Secondary | ICD-10-CM | POA: Diagnosis not present

## 2018-08-23 DIAGNOSIS — R55 Syncope and collapse: Secondary | ICD-10-CM

## 2018-08-23 DIAGNOSIS — E782 Mixed hyperlipidemia: Secondary | ICD-10-CM | POA: Diagnosis not present

## 2018-08-23 DIAGNOSIS — E039 Hypothyroidism, unspecified: Secondary | ICD-10-CM | POA: Diagnosis not present

## 2018-08-23 DIAGNOSIS — J069 Acute upper respiratory infection, unspecified: Secondary | ICD-10-CM

## 2018-08-23 LAB — CBC
HCT: 35.4 % — ABNORMAL LOW (ref 36.0–46.0)
HEMOGLOBIN: 11.2 g/dL — AB (ref 12.0–15.0)
MCH: 28.6 pg (ref 26.0–34.0)
MCHC: 31.6 g/dL (ref 30.0–36.0)
MCV: 90.5 fL (ref 80.0–100.0)
NRBC: 0 % (ref 0.0–0.2)
Platelets: 246 10*3/uL (ref 150–400)
RBC: 3.91 MIL/uL (ref 3.87–5.11)
RDW: 12.6 % (ref 11.5–15.5)
WBC: 7 10*3/uL (ref 4.0–10.5)

## 2018-08-23 LAB — URINE CULTURE: CULTURE: NO GROWTH

## 2018-08-23 LAB — RESPIRATORY PANEL BY PCR
ADENOVIRUS-RVPPCR: NOT DETECTED
Bordetella pertussis: NOT DETECTED
CHLAMYDOPHILA PNEUMONIAE-RVPPCR: NOT DETECTED
CORONAVIRUS 229E-RVPPCR: NOT DETECTED
CORONAVIRUS OC43-RVPPCR: NOT DETECTED
Coronavirus HKU1: NOT DETECTED
Coronavirus NL63: NOT DETECTED
Influenza A: NOT DETECTED
Influenza B: NOT DETECTED
MYCOPLASMA PNEUMONIAE-RVPPCR: NOT DETECTED
Metapneumovirus: NOT DETECTED
PARAINFLUENZA VIRUS 1-RVPPCR: NOT DETECTED
Parainfluenza Virus 2: NOT DETECTED
Parainfluenza Virus 3: NOT DETECTED
Parainfluenza Virus 4: NOT DETECTED
RESPIRATORY SYNCYTIAL VIRUS-RVPPCR: NOT DETECTED
Rhinovirus / Enterovirus: DETECTED — AB

## 2018-08-23 LAB — BASIC METABOLIC PANEL
ANION GAP: 7 (ref 5–15)
BUN: 12 mg/dL (ref 8–23)
CO2: 23 mmol/L (ref 22–32)
Calcium: 8.3 mg/dL — ABNORMAL LOW (ref 8.9–10.3)
Chloride: 109 mmol/L (ref 98–111)
Creatinine, Ser: 0.79 mg/dL (ref 0.44–1.00)
GLUCOSE: 95 mg/dL (ref 70–99)
POTASSIUM: 3.9 mmol/L (ref 3.5–5.1)
SODIUM: 139 mmol/L (ref 135–145)

## 2018-08-23 LAB — ECHOCARDIOGRAM COMPLETE
HEIGHTINCHES: 65 in
WEIGHTICAEL: 2112 [oz_av]

## 2018-08-23 LAB — TROPONIN I: Troponin I: <0.03 ng/mL (ref <0.03)

## 2018-08-23 MED ORDER — PANTOPRAZOLE SODIUM 40 MG PO TBEC: 40.0000 mg | DELAYED_RELEASE_TABLET | Freq: Every day | ORAL | Status: DC

## 2018-08-23 MED ORDER — PANTOPRAZOLE SODIUM 40 MG PO TBEC: 40.0000 mg | DELAYED_RELEASE_TABLET | Freq: Every day | ORAL | 0 refills | Status: DC

## 2018-08-23 NOTE — Discharge Summary (Signed)
Physician Discharge Summary  [name] [name] RJJ:[id number] DOB: 07-Jul-1951 DOA: 08/22/2018  PCP: Terald Sleeper, PA-C  Admit date: 08/22/2018 Discharge date: 08/23/2018  Admitted From: Home Disposition:  Home   Recommendations for Outpatient Follow-up:  1. Follow up with PCP in 1-2 weeks 2. Please obtain BMP/CBC in one week     Discharge Condition: Stable CODE STATUS: FULL Diet recommendation: Heart Healthy   Brief/Interim Summary: 67 y.o. female with medical history of anxiety, hypothyroidism, hyperlipidemia presenting with chest pain, nausea, vomiting, and resultant syncope.  The patient woke up eloped before 8 AM on 08/22/2018 with substernal chest pressure.  Subsequently, the patient felt nauseous and went to the bathroom to have a few episodes of emesis.  Her husband was helping her out, and noted the patient to lose consciousness after an episode of emesis.  The patient felt lightheaded while walking to the bathroom and prior to the emesis.  According to the patient's husband, the patient had lost consciousness for approximately 2 minutes.  She did not bite her tongue or have bowel or bladder incontinence.  After she woke up, the patient had another 2 episodes of emesis without any blood.  EMS was activated.  Apparently, the patient systolic blood pressure was low and the patient was given 500 cc normal saline bolus.  The patient has had a sore throat with some coughing and chest congestion since 08/20/2018.  She denies any frank fevers or chills.  She has not had any diarrhea, abdominal pain, dysuria, hematuria, headache, neck pain.  She denies any recent sick contacts or travels. Upon arrival to the emergency department, the patient was afebrile hemodynamically stable saturating 100% room air.  BMP, LFTs, and CBC were essentially unremarkable.  Influenza PCR was negative.  Urinalysis was negative for pyuria.  CT of the brain was negative for acute findings and clear sinuses.  The  patient was started on IV fluids and admission was requested for the constellation of symptoms  Discharge Diagnoses:  Syncope -Secondary to vagal reaction -Orthostatic vital signs positive -Continue IV fluids--> clinically improved -Place on telemetry -Echocardiogram EF 65 to 70%, no WMA  Atypical chest pain -Echocardiogram--EF 65-70%, no WMA -Cycle troponins--negative x3 -EKG without concerning ischemic changes -Suspect may be related to GERD  Nausea and vomiting/acute bronchitis -Suspect underlying viral infection -Patient is feeling better and wants to eat "real food" -Resolved.  Diet advanced and patient tolerated it -Viral respiratory panel pending at the time of discharge -Patient has remained afebrile and hemodynamically stable without leukocytosis  Hypotension -Secondary to volume depletion -Continue IV fluids -Improving with IV fluids -Urinalysis negative for pyuria -Patient is afebrile without leukocytosis -Chest x-ray negative for infiltrates or edema -Blood pressure improved with fluid resuscitation  Hyperlipidemia -Continue statin  Hypothyroidism -Continue Synthroid  GERD -Start Protonix -Follow PCP   Discharge Instructions   Allergies as of 08/23/2018   No Known Allergies     Medication List    STOP taking these medications   amoxicillin 500 MG capsule Commonly known as:  AMOXIL     TAKE these medications   atorvastatin 40 MG tablet Commonly known as:  LIPITOR Take 1 tablet (40 mg total) by mouth daily.   clobetasol ointment 0.05 % Commonly known as:  TEMOVATE   levothyroxine 88 MCG tablet Commonly known as:  SYNTHROID, LEVOTHROID TAKE 1 TABLET (88 MCG TOTAL) BY MOUTH DAILY BEFORE BREAKFAST.   pantoprazole 40 MG tablet Commonly known as:  PROTONIX Take 1 tablet (40 mg total) by mouth  daily.   Vitamin D3 50 MCG (2000 UT) Tabs Take 1 tablet by mouth daily.       No Known  Allergies  Consultations:  none   Procedures/Studies: Dg Chest 2 View  Result Date: 08/22/2018 CLINICAL DATA:  Chest pain. Chest heaviness this morning. Syncopal episode. EXAM: CHEST - 2 VIEW COMPARISON:  None. FINDINGS: Cardiac silhouette is normal in size. No mediastinal or hilar masses. No evidence of adenopathy. Clear lungs.  No pleural effusion or pneumothorax. Skeletal structures are intact. IMPRESSION: No active cardiopulmonary disease. Electronically Signed   By: Lajean Manes M.D.   On: 08/22/2018 10:51   Ct Head Wo Contrast  Result Date: 08/22/2018 CLINICAL DATA:  Vomiting.  Syncopal episode. EXAM: CT HEAD WITHOUT CONTRAST TECHNIQUE: Contiguous axial images were obtained from the base of the skull through the vertex without intravenous contrast. COMPARISON:  08/17/2005 MRI FINDINGS: Brain: The brain shows a normal appearance without evidence of malformation, atrophy, old or acute small or large vessel infarction, mass lesion, hemorrhage, hydrocephalus or extra-axial collection. Vascular: No hyperdense vessel. No evidence of atherosclerotic calcification. Skull: Normal.  No traumatic finding.  No focal bone lesion. Sinuses/Orbits: Sinuses are clear. Orbits appear normal. Mastoids are clear. Other: None significant IMPRESSION: Normal head CT Electronically Signed   By: Nelson Chimes M.D.   On: 08/22/2018 11:54         Discharge Exam: Vitals:   08/23/18 0615 08/23/18 1058  BP: 99/64 113/70  Pulse: 74 94  Resp: 15   Temp: 98.9 F (37.2 C)   SpO2: 98% 100%   Vitals:   08/22/18 1800 08/22/18 2053 08/23/18 0615 08/23/18 1058  BP: 100/61 90/63 99/64  113/70  Pulse: 89 83 74 94  Resp: 15 16 15    Temp:  100 F (37.8 C) 98.9 F (37.2 C)   TempSrc:   Oral   SpO2: 98% 98% 98% 100%  Weight:  59.9 kg    Height:  5\' 5"  (1.651 m)      General: Pt is alert, awake, not in acute distress Cardiovascular: RRR, S1/S2 +, no rubs, no gallops Respiratory: CTA bilaterally, no wheezing,  no rhonchi Abdominal: Soft, NT, ND, bowel sounds + Extremities: no edema, no cyanosis   The results of significant diagnostics from this hospitalization (including imaging, microbiology, ancillary and laboratory) are listed below for reference.    Significant Diagnostic Studies: Dg Chest 2 View  Result Date: 08/22/2018 CLINICAL DATA:  Chest pain. Chest heaviness this morning. Syncopal episode. EXAM: CHEST - 2 VIEW COMPARISON:  None. FINDINGS: Cardiac silhouette is normal in size. No mediastinal or hilar masses. No evidence of adenopathy. Clear lungs.  No pleural effusion or pneumothorax. Skeletal structures are intact. IMPRESSION: No active cardiopulmonary disease. Electronically Signed   By: Lajean Manes M.D.   On: 08/22/2018 10:51   Ct Head Wo Contrast  Result Date: 08/22/2018 CLINICAL DATA:  Vomiting.  Syncopal episode. EXAM: CT HEAD WITHOUT CONTRAST TECHNIQUE: Contiguous axial images were obtained from the base of the skull through the vertex without intravenous contrast. COMPARISON:  08/17/2005 MRI FINDINGS: Brain: The brain shows a normal appearance without evidence of malformation, atrophy, old or acute small or large vessel infarction, mass lesion, hemorrhage, hydrocephalus or extra-axial collection. Vascular: No hyperdense vessel. No evidence of atherosclerotic calcification. Skull: Normal.  No traumatic finding.  No focal bone lesion. Sinuses/Orbits: Sinuses are clear. Orbits appear normal. Mastoids are clear. Other: None significant IMPRESSION: Normal head CT Electronically Signed   By: Jan Fireman.D.  On: 08/22/2018 11:54     Microbiology: Recent Results (from the past 240 hour(s))  Group A Strep by PCR     Status: None   Collection Time: 08/22/18 10:47 AM  Result Value Ref Range Status   Group A Strep by PCR NOT DETECTED NOT DETECTED Final    Comment: Performed at [facility], [address], [place], [place] [address]  Urine culture     Status: None   Collection Time:  08/22/18 10:48 AM  Result Value Ref Range Status   Specimen Description   Final    URINE, CLEAN CATCH Performed at [facility], [address] [place], [place] [address]    Special Requests   Final    NONE Performed at [facility], [address] [place], [place] [address]    Culture   Final    NO GROWTH Performed at [facility], [place] [address], [place], [place] [address]    Report Status 08/23/2018 FINAL  Final     Labs: Basic Metabolic Panel: Recent Labs  Lab 08/22/18 1021 08/23/18 0423  NA 139 139  K 3.9 3.9  CL 107 109  CO2 27 23  GLUCOSE 110* 95  BUN 15 12  CREATININE 0.72 0.79  CALCIUM 8.2* 8.3*   Liver Function Tests: Recent Labs  Lab 08/22/18 1021  AST 24  ALT 34  ALKPHOS 77  BILITOT 1.0  PROT 6.4*  ALBUMIN 3.5   Recent Labs  Lab 08/22/18 1021  LIPASE 26   No results for input(s): AMMONIA in the last 168 hours. CBC: Recent Labs  Lab 08/22/18 1021 08/23/18 0423  WBC 7.0 7.0  HGB 12.3 11.2*  HCT 39.1 35.4*  MCV 89.9 90.5  PLT 216 246   Cardiac Enzymes: Recent Labs  Lab 08/22/18 1021 08/22/18 1621 08/22/18 2232 08/23/18 0423  TROPONINI <0.03 <0.03 <0.03 <0.03   BNP: Invalid input(s): POCBNP CBG: Recent Labs  Lab 08/22/18 1023  GLUCAP 86    Time coordinating discharge:  36 minutes  Signed:  Orson Eva, DO [facility] Pager: [phone] 08/23/2018, 1:59 PM

## 2018-08-23 NOTE — Progress Notes (Signed)
*  PRELIMINARY RESULTS* Echocardiogram 2D Echocardiogram has been performed.  [name] 08/23/2018, 10:00 AM

## 2018-08-23 NOTE — Progress Notes (Signed)
Removed IV-clean, dry, intact. Reviewed d/c paperwork with patient and husband. Answered all questions. Walked stable patient, husband, and belongings to Media planner. Patient going home.

## 2018-08-24 ENCOUNTER — Encounter (INDEPENDENT_AMBULATORY_CARE_PROVIDER_SITE_OTHER): Payer: BC Managed Care – PPO | Admitting: Ophthalmology

## 2018-08-24 DIAGNOSIS — H43813 Vitreous degeneration, bilateral: Secondary | ICD-10-CM

## 2018-08-24 DIAGNOSIS — H35372 Puckering of macula, left eye: Secondary | ICD-10-CM

## 2018-08-24 DIAGNOSIS — H33303 Unspecified retinal break, bilateral: Secondary | ICD-10-CM | POA: Diagnosis not present

## 2018-08-24 DIAGNOSIS — H35413 Lattice degeneration of retina, bilateral: Secondary | ICD-10-CM

## 2018-08-24 DIAGNOSIS — H2513 Age-related nuclear cataract, bilateral: Secondary | ICD-10-CM

## 2018-08-24 LAB — HIV ANTIBODY (ROUTINE TESTING W REFLEX): HIV SCREEN 4TH GENERATION: NONREACTIVE

## 2018-09-17 ENCOUNTER — Encounter: Payer: Self-pay | Admitting: Physician Assistant

## 2018-09-17 ENCOUNTER — Ambulatory Visit: Payer: BC Managed Care – PPO | Admitting: Physician Assistant

## 2018-09-17 VITALS — BP 123/82 | HR 81 | Temp 97.7°F | Ht 65.0 in | Wt 135.4 lb

## 2018-09-17 DIAGNOSIS — K2100 Gastro-esophageal reflux disease with esophagitis, without bleeding: Secondary | ICD-10-CM

## 2018-09-17 DIAGNOSIS — E039 Hypothyroidism, unspecified: Secondary | ICD-10-CM | POA: Diagnosis not present

## 2018-09-17 DIAGNOSIS — R05 Cough: Secondary | ICD-10-CM

## 2018-09-17 DIAGNOSIS — R059 Cough, unspecified: Secondary | ICD-10-CM

## 2018-09-17 MED ORDER — PANTOPRAZOLE SODIUM 40 MG PO TBEC: 40.0000 mg | DELAYED_RELEASE_TABLET | Freq: Every day | ORAL | 5 refills | Status: DC

## 2018-09-17 MED ORDER — CETIRIZINE HCL 10 MG PO TABS: 10.0000 mg | ORAL_TABLET | Freq: Every day | ORAL | 3 refills | Status: DC

## 2018-09-17 MED ORDER — ESCITALOPRAM OXALATE 10 MG PO TABS: 10.0000 mg | ORAL_TABLET | Freq: Every day | ORAL | 3 refills | Status: DC

## 2018-09-17 NOTE — Progress Notes (Signed)
BP 123/82   Pulse 81   Temp 97.7 F (36.5 C) (Oral)   Ht 5\' 5"  (1.651 m)   Wt 135 lb 6.4 oz (61.4 kg)   BMI 22.53 kg/m    Subjective:    Patient ID: [name] [name], female    DOB: [birth year], 67 y.o.   MRN: [id number]  HPI: [name] [name] is a 67 y.o. female presenting on 09/17/2018 for Gastroesophageal Reflux  In early November the patient had a significant episode of chest pain in the center of her chest.  She experienced a tightening feeling.  She also had nausea, weakness, vomiting.  Her blood pressure had dropped.  She did go to the emergency room and she was ruled out of having any type of cardiac cause for this.  Is also ruled out for having a blood clot.  She ended up having an echocardiogram, EKG and scans.  She has continued to have episodic times of the discomfort in her chest and increased acid in her stomach.  She is currently taking pantoprazole 40 mg daily.   Past Medical History:  Diagnosis Date  . Allergy   . Anxiety   . History of cold sores   . Hyperlipidemia   . Osteopenia   . Rosacea   . Thyroid disease    hypothyroid   Relevant past medical, surgical, family and social history reviewed and updated as indicated. Interim medical history since our last visit reviewed. Allergies and medications reviewed and updated. DATA REVIEWED: CHART IN EPIC  Family History reviewed for pertinent findings.  Review of Systems  Constitutional: Negative.   HENT: Negative.   Eyes: Negative.   Respiratory: Negative.   Gastrointestinal: Positive for abdominal distention and abdominal pain. Negative for constipation, diarrhea, nausea and rectal pain.  Genitourinary: Negative.     Allergies as of 09/17/2018   No Known Allergies     Medication List        Accurate as of 09/17/18 11:59 PM. Always use your most recent med list.          atorvastatin 40 MG tablet Commonly known as:  LIPITOR Take 1 tablet (40 mg total) by mouth daily.   cetirizine 10 MG  tablet Commonly known as:  ZYRTEC Take 1 tablet (10 mg total) by mouth daily.   clobetasol ointment 0.05 % Commonly known as:  TEMOVATE   escitalopram 10 MG tablet Commonly known as:  LEXAPRO Take 1 tablet (10 mg total) by mouth daily.   levothyroxine 88 MCG tablet Commonly known as:  SYNTHROID, LEVOTHROID TAKE 1 TABLET (88 MCG TOTAL) BY MOUTH DAILY BEFORE BREAKFAST.   pantoprazole 40 MG tablet Commonly known as:  PROTONIX Take 1 tablet (40 mg total) by mouth daily.   Vitamin D3 50 MCG (2000 UT) Tabs Take 1 tablet by mouth daily.          Objective:    BP 123/82   Pulse 81   Temp 97.7 F (36.5 C) (Oral)   Ht 5\' 5"  (1.651 m)   Wt 135 lb 6.4 oz (61.4 kg)   BMI 22.53 kg/m   No Known Allergies  Wt Readings from Last 3 Encounters:  09/17/18 135 lb 6.4 oz (61.4 kg)  08/22/18 132 lb (59.9 kg)  06/16/18 133 lb (60.3 kg)    Physical Exam  Constitutional: She is oriented to person, place, and time. She appears well-developed and well-nourished.  HENT:  Head: Normocephalic and atraumatic.  Right Ear: Tympanic membrane, external ear and  ear canal normal.  Left Ear: Tympanic membrane, external ear and ear canal normal.  Nose: Nose normal. No rhinorrhea.  Mouth/Throat: Oropharynx is clear and moist and mucous membranes are normal. No oropharyngeal exudate or posterior oropharyngeal erythema.  Eyes: Pupils are equal, round, and reactive to light. Conjunctivae and EOM are normal.  Neck: Normal range of motion. Neck supple.  Cardiovascular: Normal rate, regular rhythm, normal heart sounds and intact distal pulses.  Pulmonary/Chest: Effort normal and breath sounds normal.  Abdominal: Soft. Bowel sounds are normal.  Neurological: She is alert and oriented to person, place, and time. She has normal reflexes.  Skin: Skin is warm and dry. No rash noted.  Psychiatric: She has a normal mood and affect. Her behavior is normal. Judgment and thought content normal.    Results for  orders placed or performed in visit on 09/17/18  TSH  Result Value Ref Range   TSH 2.430 0.450 - 4.500 uIU/mL      Assessment & Plan:   1. Hypothyroidism (acquired) - TSH  2. Cough Reduce acid and hopefully eliminate cough  3. Gastroesophageal reflux disease with esophagitis Reduce acid Consider gastro referral   Continue all other maintenance medications as listed above.  Follow up plan: Return in about 4 weeks (around 10/15/2018).  Educational handout given for [place] PA-C [employer] [address]  [place], [place] [address] [phone]   09/20/2018, 1:44 PM

## 2018-09-18 LAB — TSH: TSH: 2.43 u[IU]/mL (ref 0.450–4.500)

## 2018-10-19 ENCOUNTER — Ambulatory Visit: Payer: Medicare Other | Admitting: Physician Assistant

## 2018-10-19 ENCOUNTER — Encounter: Payer: Self-pay | Admitting: Physician Assistant

## 2018-10-19 VITALS — BP 116/74 | HR 89 | Temp 98.5°F | Ht 65.0 in | Wt 138.0 lb

## 2018-10-19 DIAGNOSIS — K2100 Gastro-esophageal reflux disease with esophagitis, without bleeding: Secondary | ICD-10-CM

## 2018-10-19 DIAGNOSIS — Z23 Encounter for immunization: Secondary | ICD-10-CM

## 2018-10-21 NOTE — Progress Notes (Signed)
BP 116/74   Pulse 89   Temp 98.5 F (36.9 C) (Oral)   Ht 5\' 5"  (1.651 m)   Wt 138 lb (62.6 kg)   BMI 22.96 kg/m    Subjective:    Patient ID: [name] [name], female    DOB: 28-Aug-1951, 68 y.o.   MRN: [id number]  HPI: [name] [name] is a 68 y.o. female presenting on 10/19/2018 for Gastroesophageal Reflux (4 week follow up )  She comes in for a recheck on the chronic cough related to her GERD and allergic rhinits. Once starting the zyrtec and the protonix there has been a great relief in the cough. She had just a couple of chest squeezing reflux where she tasted food. She cannot relate it to any specific food or body position.  Past Medical History:  Diagnosis Date  . Allergy   . Anxiety   . History of cold sores   . Hyperlipidemia   . Osteopenia   . Rosacea   . Thyroid disease    hypothyroid   Relevant past medical, surgical, family and social history reviewed and updated as indicated. Interim medical history since our last visit reviewed. Allergies and medications reviewed and updated. DATA REVIEWED: CHART IN EPIC  Family History reviewed for pertinent findings.  Review of Systems  Constitutional: Negative.   HENT: Negative.   Eyes: Negative.   Respiratory: Negative.   Gastrointestinal: Positive for abdominal distention. Negative for constipation.  Genitourinary: Negative.     Allergies as of 10/19/2018   No Known Allergies     Medication List       Accurate as of October 19, 2018 11:59 PM. Always use your most recent med list.        atorvastatin 40 MG tablet Commonly known as:  LIPITOR Take 1 tablet (40 mg total) by mouth daily.   cetirizine 10 MG tablet Commonly known as:  ZYRTEC Take 1 tablet (10 mg total) by mouth daily.   clobetasol ointment 0.05 % Commonly known as:  TEMOVATE   escitalopram 10 MG tablet Commonly known as:  LEXAPRO Take 1 tablet (10 mg total) by mouth daily.   levothyroxine 88 MCG tablet Commonly known as:  SYNTHROID,  LEVOTHROID TAKE 1 TABLET (88 MCG TOTAL) BY MOUTH DAILY BEFORE BREAKFAST.   pantoprazole 40 MG tablet Commonly known as:  PROTONIX Take 1 tablet (40 mg total) by mouth daily.   Vitamin D3 50 MCG (2000 UT) Tabs Take 1 tablet by mouth daily.          Objective:    BP 116/74   Pulse 89   Temp 98.5 F (36.9 C) (Oral)   Ht 5\' 5"  (1.651 m)   Wt 138 lb (62.6 kg)   BMI 22.96 kg/m   No Known Allergies  Wt Readings from Last 3 Encounters:  10/19/18 138 lb (62.6 kg)  09/17/18 135 lb 6.4 oz (61.4 kg)  08/22/18 132 lb (59.9 kg)    Physical Exam Constitutional:      Appearance: She is well-developed.  HENT:     Head: Normocephalic and atraumatic.     Right Ear: Tympanic membrane, ear canal and external ear normal.     Left Ear: Tympanic membrane, ear canal and external ear normal.     Nose: Nose normal. No rhinorrhea.     Mouth/Throat:     Pharynx: No oropharyngeal exudate or posterior oropharyngeal erythema.  Eyes:     Conjunctiva/sclera: Conjunctivae normal.     Pupils: Pupils are  equal, round, and reactive to light.  Neck:     Musculoskeletal: Normal range of motion and neck supple.  Cardiovascular:     Rate and Rhythm: Normal rate and regular rhythm.     Heart sounds: Normal heart sounds.  Pulmonary:     Effort: Pulmonary effort is normal.     Breath sounds: Normal breath sounds.  Abdominal:     General: Bowel sounds are normal.     Palpations: Abdomen is soft.  Skin:    General: Skin is warm and dry.     Findings: No rash.  Neurological:     Mental Status: She is alert and oriented to person, place, and time.     Deep Tendon Reflexes: Reflexes are normal and symmetric.  Psychiatric:        Behavior: Behavior normal.        Thought Content: Thought content normal.        Judgment: Judgment normal.     Results for orders placed or performed in visit on 09/17/18  TSH  Result Value Ref Range   TSH 2.430 0.450 - 4.500 uIU/mL      Assessment & Plan:   1.  Gastroesophageal reflux disease with esophagitis - Ambulatory referral to Gastroenterology   Continue all other maintenance medications as listed above.  Follow up plan: No follow-ups on file.  Educational handout given for [place] PA-C [place] [address]  [place], [place] [address] [phone]   10/21/2018, 9:26 PM

## 2018-11-23 ENCOUNTER — Ambulatory Visit: Payer: Medicare Other | Admitting: Gastroenterology

## 2018-11-23 ENCOUNTER — Encounter: Payer: Self-pay | Admitting: Gastroenterology

## 2018-11-23 VITALS — BP 118/70 | HR 94 | Ht 65.0 in | Wt 136.0 lb

## 2018-11-23 DIAGNOSIS — R112 Nausea with vomiting, unspecified: Secondary | ICD-10-CM

## 2018-11-23 DIAGNOSIS — R1013 Epigastric pain: Secondary | ICD-10-CM | POA: Diagnosis not present

## 2018-11-23 NOTE — Progress Notes (Signed)
HPI: This is a very pleasant 68 year old woman who was referred to me by Terald Sleeper, PA-C  to evaluate recent epigastric pain, nausea vomiting.    Chief complaint is epigastric pain, nausea vomiting  This past November she awoke in the early morning with a spasm-like pain in her epigastrium, she says it worked its way upward into her chest and then into her throat.  She proceeded to get very nauseous, vomited and then had a syncopal episode.  The symptom complex lasted 8 to 10 minutes tops.  She has never had it before and has not had it since.  She does not have really any chronic GERD symptoms.  She has no dysphasia.  Her weight has been overall stable.  She presented to the emergency room, see the work-up below.  Old Data Reviewed: Colonoscopy Dr. Erskine Emery October 2012, noted a very tortuous colon.  He was unable to enter the cecum but saw it in the distance.  A single subcentimeter polyp was removed and it was hyperplastic.  He recommended repeat colonoscopy at 10-year interval.   I reviewed a discharge summary from November 2019.  She was briefly hospitalized at the [facility] system with "atypical chest pain".  Cardiac testing with an echocardiogram showed normal ejection fraction, her troponins were normal, her EKG was not concerning for ischemic changes.  It was written that her pains were "suspect may be related to GERD" during that admission her complete metabolic profile showed normal liver tests and her CBC was normal.  Was put on protonix, 1 pill once daily.  She has had no recurrence of symptoms in the past 3 months.  Night time cough.  No overt post nasal drop.   Review of systems: Pertinent positive and negative review of systems were noted in the above HPI section. All other review negative.   Past Medical History:  Diagnosis Date  . Allergy   . Anxiety   . History of cold sores   . Hyperlipidemia   . Osteopenia   . Rosacea   . Thyroid disease    hypothyroid     Past Surgical History:  Procedure Laterality Date  . TUBAL LIGATION    . WISDOM TOOTH EXTRACTION      Current Outpatient Medications  Medication Sig Dispense Refill  . atorvastatin (LIPITOR) 40 MG tablet Take 1 tablet (40 mg total) by mouth daily. 90 tablet 3  . Calcium Carbonate-Vitamin D (CALCIUM 500/D PO) Take by mouth.    . cetirizine (ZYRTEC) 10 MG tablet Take 1 tablet (10 mg total) by mouth daily. 90 tablet 3  . Cholecalciferol (VITAMIN D3) 2000 UNITS TABS Take 1 tablet by mouth daily.      . clobetasol ointment (TEMOVATE) 0.05 %     . escitalopram (LEXAPRO) 10 MG tablet Take 1 tablet (10 mg total) by mouth daily. 90 tablet 3  . levothyroxine (SYNTHROID, LEVOTHROID) 88 MCG tablet TAKE 1 TABLET (88 MCG TOTAL) BY MOUTH DAILY BEFORE BREAKFAST. 90 tablet 3  . Multiple Vitamins-Minerals (ICAPS AREDS 2 PO) Take by mouth.    . pantoprazole (PROTONIX) 40 MG tablet Take 1 tablet (40 mg total) by mouth daily. 30 tablet 5   No current facility-administered medications for this visit.     Allergies as of 11/23/2018  . (No Known Allergies)    Family History  Problem Relation Age of Onset  . Glaucoma Mother   . Cerebral aneurysm Mother   . Stroke Father   . Hypertension Father   .  Lung cancer Father   . Heart attack Paternal Uncle   . Heart attack Maternal Uncle   . Colon cancer Neg Hx   . Stomach cancer Neg Hx   . Esophageal cancer Neg Hx     Social History   Socioeconomic History  . Marital status: Single    Spouse name: Not on file  . Number of children: 1  . Years of education: Not on file  . Highest education level: Not on file  Occupational History  . Occupation: Conservation officer, historic buildings: [employer]  Social Needs  . Financial resource strain: Not on file  . Food insecurity:    Worry: Not on file    Inability: Not on file  . Transportation needs:    Medical: Not on file    Non-medical: Not on file  Tobacco Use  . Smoking status: Never Smoker   . Smokeless tobacco: Never Used  Substance and Sexual Activity  . Alcohol use: No  . Drug use: No  . Sexual activity: Yes    Partners: Male  Lifestyle  . Physical activity:    Days per week: Not on file    Minutes per session: Not on file  . Stress: Not on file  Relationships  . Social connections:    Talks on phone: Not on file    Gets together: Not on file    Attends religious service: Not on file    Active member of club or organization: Not on file    Attends meetings of clubs or organizations: Not on file    Relationship status: Not on file  . Intimate partner violence:    Fear of current or ex partner: Not on file    Emotionally abused: Not on file    Physically abused: Not on file    Forced sexual activity: Not on file  Other Topics Concern  . Not on file  Social History Narrative  . Not on file     Physical Exam: BP 118/70   Pulse 94   Ht 5\' 5"  (1.651 m)   Wt 136 lb (61.7 kg)   BMI 22.63 kg/m  Constitutional: generally well-appearing Psychiatric: alert and oriented x3 Eyes: extraocular movements intact Mouth: oral pharynx moist, no lesions Neck: supple no lymphadenopathy Cardiovascular: heart regular rate and rhythm Lungs: clear to auscultation bilaterally Abdomen: soft, nontender, nondistended, no obvious ascites, no peritoneal signs, normal bowel sounds Extremities: no lower extremity edema bilaterally Skin: no lesions on visible extremities   Assessment and plan: 68 y.o. female with single episode of epigastric spasm, nausea vomiting  This may have been GERD related.  We also discussed possibility of biliary etiology although admittedly it would be an unusual symptom complex for gallstone disease.  Her symptoms never happened before November and have not happened since.  She has no alarm symptoms.  She takes proton pump inhibitor once daily and I think since she had no preceding GERD issues it would be fine for her to stop that now.  Since she does  have some overnight cough, chronically for many years, I recommended a trial of bedtime H2 blocker since this might be related to acid exposure when laying down.  We considered ultrasound testing of her gallbladder however she was not to interested in that.  She knows to call here if she has the symptom complex again or any other GI issues otherwise she will return to see me in 3 or 4 months.    Please  see the "Patient Instructions" section for addition details about the plan.   Owens Loffler, MD [facility] 11/23/2018, 10:11 AM  Cc: Terald Sleeper, PA-C

## 2018-11-23 NOTE — Patient Instructions (Addendum)
Stop protonix. Start pepcid (famotidine) 20mg  pills, one pill at bedtime nightly. Hope this helps your overnight coughing.  Please return to see Dr. Ardis Hughs in 3-4 months. Call in April for appointment  Thank you for entrusting me with your care and choosing [facility].  Dr Ardis Hughs

## 2019-01-19 ENCOUNTER — Encounter: Payer: Medicare Other | Admitting: Physician Assistant

## 2019-02-09 ENCOUNTER — Telehealth: Payer: Self-pay

## 2019-02-09 NOTE — Telephone Encounter (Signed)
FYI, patient declined gastroenterology appointment.

## 2019-04-08 ENCOUNTER — Telehealth: Payer: Self-pay | Admitting: Physician Assistant

## 2019-04-08 NOTE — Telephone Encounter (Signed)
Patient given nurse appointment for shingrix.

## 2019-04-11 ENCOUNTER — Other Ambulatory Visit: Payer: Self-pay

## 2019-04-11 ENCOUNTER — Ambulatory Visit (INDEPENDENT_AMBULATORY_CARE_PROVIDER_SITE_OTHER): Payer: Medicare Other | Admitting: *Deleted

## 2019-04-11 ENCOUNTER — Encounter: Payer: Medicare Other | Admitting: Physician Assistant

## 2019-04-11 DIAGNOSIS — Z23 Encounter for immunization: Secondary | ICD-10-CM

## 2019-04-11 NOTE — Progress Notes (Signed)
Pt given 2nd Shingrix vaccine Pt tolerated well

## 2019-04-29 ENCOUNTER — Other Ambulatory Visit: Payer: Self-pay

## 2019-05-02 ENCOUNTER — Other Ambulatory Visit: Payer: Self-pay

## 2019-05-02 ENCOUNTER — Ambulatory Visit (INDEPENDENT_AMBULATORY_CARE_PROVIDER_SITE_OTHER): Payer: Medicare Other | Admitting: Physician Assistant

## 2019-05-02 ENCOUNTER — Encounter: Payer: Self-pay | Admitting: Physician Assistant

## 2019-05-02 VITALS — BP 112/73 | HR 95 | Temp 97.0°F | Ht 65.0 in | Wt 139.6 lb

## 2019-05-02 DIAGNOSIS — I471 Supraventricular tachycardia, unspecified: Secondary | ICD-10-CM

## 2019-05-02 DIAGNOSIS — Z1211 Encounter for screening for malignant neoplasm of colon: Secondary | ICD-10-CM

## 2019-05-02 DIAGNOSIS — Z23 Encounter for immunization: Secondary | ICD-10-CM

## 2019-05-02 DIAGNOSIS — Z0000 Encounter for general adult medical examination without abnormal findings: Secondary | ICD-10-CM

## 2019-05-02 DIAGNOSIS — E7841 Elevated Lipoprotein(a): Secondary | ICD-10-CM

## 2019-05-02 DIAGNOSIS — E039 Hypothyroidism, unspecified: Secondary | ICD-10-CM

## 2019-05-02 DIAGNOSIS — Z0001 Encounter for general adult medical examination with abnormal findings: Secondary | ICD-10-CM

## 2019-05-02 MED ORDER — ATORVASTATIN CALCIUM 40 MG PO TABS: 40.0000 mg | ORAL_TABLET | Freq: Every day | ORAL | 3 refills | Status: DC

## 2019-05-02 MED ORDER — CETIRIZINE HCL 10 MG PO TABS: 10.0000 mg | ORAL_TABLET | Freq: Every day | ORAL | 3 refills | Status: DC

## 2019-05-02 MED ORDER — FAMOTIDINE 20 MG PO TABS: 20.0000 mg | ORAL_TABLET | Freq: Two times a day (BID) | ORAL | Status: DC

## 2019-05-02 NOTE — Patient Instructions (Signed)
Sciatica ° °Sciatica is pain, weakness, tingling, or loss of feeling (numbness) along the sciatic nerve. The sciatic nerve starts in the lower back and goes down the back of each leg. Sciatica usually goes away on its own or with treatment. Sometimes, sciatica may come back (recur). °What are the causes? °This condition happens when the sciatic nerve is pinched or has pressure put on it. This may be the result of: °· A disk in between the bones of the spine bulging out too far (herniated disk). °· Changes in the spinal disks that occur with aging. °· A condition that affects a muscle in the butt. °· Extra bone growth near the sciatic nerve. °· A break (fracture) of the area between your hip bones (pelvis). °· Pregnancy. °· Tumor. This is rare. °What increases the risk? °You are more likely to develop this condition if you: °· Play sports that put pressure or stress on the spine. °· Have poor strength and ease of movement (flexibility). °· Have had a back injury in the past. °· Have had back surgery. °· Sit for long periods of time. °· Do activities that involve bending or lifting over and over again. °· Are very overweight (obese). °What are the signs or symptoms? °Symptoms can vary from mild to very bad. They may include: °· Any of these problems in the lower back, leg, hip, or butt: °? Mild tingling, loss of feeling, or dull aches. °? Burning sensations. °? Sharp pains. °· Loss of feeling in the back of the calf or the sole of the foot. °· Leg weakness. °· Very bad back pain that makes it hard to move. °These symptoms may get worse when you cough, sneeze, or laugh. They may also get worse when you sit or stand for long periods of time. °How is this treated? °This condition often gets better without any treatment. However, treatment may include: °· Changing or cutting back on physical activity when you have pain. °· Doing exercises and stretching. °· Putting ice or heat on the affected area. °· Medicines that  help: °? To relieve pain and swelling. °? To relax your muscles. °· Shots (injections) of medicines that help to relieve pain, irritation, and swelling. °· Surgery. °Follow these instructions at home: °Medicines °· Take over-the-counter and prescription medicines only as told by your doctor. °· Ask your doctor if the medicine prescribed to you: °? Requires you to avoid driving or using heavy machinery. °? Can cause trouble pooping (constipation). You may need to take these steps to prevent or treat trouble pooping: °§ Drink enough fluids to keep your pee (urine) pale yellow. °§ Take over-the-counter or prescription medicines. °§ Eat foods that are high in fiber. These include beans, whole grains, and fresh fruits and vegetables. °§ Limit foods that are high in fat and sugar. These include fried or sweet foods. °Managing pain ° °  ° °· If told, put ice on the affected area. °? Put ice in a plastic bag. °? Place a towel between your skin and the bag. °? Leave the ice on for 20 minutes, 2-3 times a day. °· If told, put heat on the affected area. Use the heat source that your doctor tells you to use, such as a moist heat pack or a heating pad. °? Place a towel between your skin and the heat source. °? Leave the heat on for 20-30 minutes. °? Remove the heat if your skin turns bright red. This is very important if you are   unable to feel pain, heat, or cold. You may have a greater risk of getting burned. °Activity ° °· Return to your normal activities as told by your doctor. Ask your doctor what activities are safe for you. °· Avoid activities that make your symptoms worse. °· Take short rests during the day. °? When you rest for a long time, do some physical activity or stretching between periods of rest. °? Avoid sitting for a long time without moving. Get up and move around at least one time each hour. °· Exercise and stretch regularly, as told by your doctor. °· Do not lift anything that is heavier than 10 lb (4.5 kg)  while you have symptoms of sciatica. °? Avoid lifting heavy things even when you do not have symptoms. °? Avoid lifting heavy things over and over. °· When you lift objects, always lift in a way that is safe for your body. To do this, you should: °? Bend your knees. °? Keep the object close to your body. °? Avoid twisting. °General instructions °· Stay at a healthy weight. °· Wear comfortable shoes that support your feet. Avoid wearing high heels. °· Avoid sleeping on a mattress that is too soft or too hard. You might have less pain if you sleep on a mattress that is firm enough to support your back. °· Keep all follow-up visits as told by your doctor. This is important. °Contact a doctor if: °· You have pain that: °? Wakes you up when you are sleeping. °? Gets worse when you lie down. °? Is worse than the pain you have had in the past. °? Lasts longer than 4 weeks. °· You lose weight without trying. °Get help right away if: °· You cannot control when you pee (urinate) or poop (have a bowel movement). °· You have weakness in any of these areas and it gets worse: °? Lower back. °? The area between your hip bones. °? Butt. °? Legs. °· You have redness or swelling of your back. °· You have a burning feeling when you pee. °Summary °· Sciatica is pain, weakness, tingling, or loss of feeling (numbness) along the sciatic nerve. °· This condition happens when the sciatic nerve is pinched or has pressure put on it. °· Sciatica can cause pain, tingling, or loss of feeling (numbness) in the lower back, legs, hips, and butt. °· Treatment often includes rest, exercise, medicines, and putting ice or heat on the affected area. °This information is not intended to replace advice given to you by your health care provider. Make sure you discuss any questions you have with your health care provider. °Document Released: 07/08/2008 Document Revised: 10/18/2018 Document Reviewed: 10/18/2018 °Elsevier Patient Education © 2020 [employer] ° °

## 2019-05-03 ENCOUNTER — Other Ambulatory Visit: Payer: Medicare Other

## 2019-05-03 DIAGNOSIS — E785 Hyperlipidemia, unspecified: Secondary | ICD-10-CM

## 2019-05-03 DIAGNOSIS — E039 Hypothyroidism, unspecified: Secondary | ICD-10-CM

## 2019-05-03 DIAGNOSIS — I471 Supraventricular tachycardia: Secondary | ICD-10-CM | POA: Insufficient documentation

## 2019-05-03 DIAGNOSIS — Z0000 Encounter for general adult medical examination without abnormal findings: Secondary | ICD-10-CM

## 2019-05-03 DIAGNOSIS — Z1211 Encounter for screening for malignant neoplasm of colon: Secondary | ICD-10-CM

## 2019-05-03 NOTE — Progress Notes (Signed)
Subjective:    [name] [name] is a 68 y.o. female who presents for a Welcome to Medicare exam.   Review of Systems Review of Systems  Constitutional: Negative.  Negative for chills and fever.  HENT: Negative.   Respiratory: Negative.  Negative for cough.   Cardiovascular: Negative.  Negative for chest pain, palpitations and leg swelling.  Gastrointestinal: Negative.   Genitourinary: Negative for dysuria.  Musculoskeletal: Negative for joint pain and myalgias.  Skin: Negative.   Neurological: Negative.     Cardiac Risk Factors include: advanced age (>76mn, >>38women);dyslipidemia      Objective:    Today's Vitals   05/02/19 1430  BP: 112/73  Pulse: 95  Temp: (!) 97 F (36.1 C)  TempSrc: Oral  Weight: 139 lb 9.6 oz (63.3 kg)  Height: '5\' 5"'  (1.651 m)  Body mass index is 23.23 kg/m.  Medications Outpatient Encounter Medications as of 05/02/2019  Medication Sig  . atorvastatin (LIPITOR) 40 MG tablet Take 1 tablet (40 mg total) by mouth daily.  . Calcium Carbonate-Vitamin D (CALCIUM 500/D PO) Take by mouth.  . cetirizine (ZYRTEC) 10 MG tablet Take 1 tablet (10 mg total) by mouth daily.  . Cholecalciferol (VITAMIN D3) 2000 UNITS TABS Take 1 tablet by mouth daily.    . clobetasol ointment (TEMOVATE) 0.05 %   . levothyroxine (SYNTHROID, LEVOTHROID) 88 MCG tablet TAKE 1 TABLET (88 MCG TOTAL) BY MOUTH DAILY BEFORE BREAKFAST.  . Multiple Vitamins-Minerals (ICAPS AREDS 2 PO) Take by mouth.  . [DISCONTINUED] atorvastatin (LIPITOR) 40 MG tablet Take 1 tablet (40 mg total) by mouth daily.  . [DISCONTINUED] cetirizine (ZYRTEC) 10 MG tablet Take 1 tablet (10 mg total) by mouth daily.  . famotidine (PEPCID) 20 MG tablet Take 1 tablet (20 mg total) by mouth 2 (two) times daily.  . [DISCONTINUED] escitalopram (LEXAPRO) 10 MG tablet Take 1 tablet (10 mg total) by mouth daily. (Patient not taking: Reported on 05/02/2019)   No facility-administered encounter medications on file as of  05/02/2019.      History: Past Medical History:  Diagnosis Date  . Allergy   . Anxiety   . BCC (basal cell carcinoma of skin) 07/31/2016   Superficial Bcc left shoulder  . History of cold sores   . Hyperlipidemia   . Osteopenia   . Rosacea   . Thyroid disease    hypothyroid   Past Surgical History:  Procedure Laterality Date  . skin cancer removal    . TUBAL LIGATION    . WISDOM TOOTH EXTRACTION      Family History  Problem Relation Age of Onset  . Glaucoma Mother   . Cerebral aneurysm Mother   . Stroke Father   . Hypertension Father   . Lung cancer Father   . Heart attack Paternal Uncle   . Heart attack Maternal Uncle   . Brain cancer Brother   . Colon cancer Neg Hx   . Stomach cancer Neg Hx   . Esophageal cancer Neg Hx    Social History   Occupational History  . 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 Smoking status: Never Smoker  . Smokeless tobacco: Never Used  Substance and Sexual Activity  . Alcohol use: No  . Drug use: No  . Sexual activity: Yes    Partners: Male    Tobacco Counseling Counseling given: Not Answered   Immunizations and Health Maintenance Immunization History  Administered Date(s) Administered  . Pneumococcal  Conjugate-13 05/02/2019  . Tdap 04/21/2011  . Zoster 05/14/2011  . Zoster Recombinat (Shingrix) 10/19/2018, 04/11/2019   Health Maintenance Due  Topic Date Due  . COLON CANCER SCREENING ANNUAL FOBT  05/19/2017    Activities of Daily Living In your present state of health, do you have any difficulty performing the following activities: 05/02/2019 08/22/2018  Hearing? N N  Vision? (No Data) N  Comment Wears glasses -  Difficulty concentrating or making decisions? N N  Walking or climbing stairs? N N  Dressing or bathing? N N  Doing errands, shopping? N N  Preparing Food and eating ? N -  Using the Toilet? N -  In the past six months, have you accidently leaked urine? N -  Do  you have problems with loss of bowel control? N -  Managing your Medications? N -  Managing your Finances? N -  Housekeeping or managing your Housekeeping? N -  Some recent data might be hidden    Physical Exam  Physical Exam Constitutional:      Appearance: She is well-developed.  HENT:     Head: Normocephalic and atraumatic.     Right Ear: Tympanic membrane, ear canal and external ear normal.     Left Ear: Tympanic membrane, ear canal and external ear normal.     Nose: Nose normal. No rhinorrhea.     Mouth/Throat:     Pharynx: No oropharyngeal exudate or posterior oropharyngeal erythema.  Eyes:     Conjunctiva/sclera: Conjunctivae normal.     Pupils: Pupils are equal, round, and reactive to light.  Neck:     Musculoskeletal: Normal range of motion and neck supple.  Cardiovascular:     Rate and Rhythm: Normal rate and regular rhythm.     Heart sounds: Normal heart sounds.  Pulmonary:     Effort: Pulmonary effort is normal.     Breath sounds: Normal breath sounds.  Abdominal:     General: Bowel sounds are normal.     Palpations: Abdomen is soft.  Skin:    General: Skin is warm and dry.     Findings: No rash.  Neurological:     Mental Status: She is alert and oriented to person, place, and time.     Deep Tendon Reflexes: Reflexes are normal and symmetric.  Psychiatric:        Behavior: Behavior normal.        Thought Content: Thought content normal.        Judgment: Judgment normal.     or other factors deemed appropriate based on the beneficiary's medical and social history and current clinical standards.  Advanced Directives: Does Patient Have a Medical Advance Directive?: Yes Type of Advance Directive: Smoot in Chart?: No - copy requested Would patient like information on creating a medical advance directive?: No - Patient declined    Assessment:    This is a routine wellness examination for this  patient .  WELCOME TO MEDICARE EXAM  Vision/Hearing screen No exam data present  Dietary issues and exercise activities discussed:  Current Exercise Habits: Home exercise routine, Type of exercise: walking, Time (Minutes): 40, Frequency (Times/Week): 5, Weekly Exercise (Minutes/Week): 200, Intensity: Moderate, Exercise limited by: None identified  Goals    . Welcome to [facility]      Depression Screen PHQ 2/9 Scores 05/02/2019 10/19/2018 09/17/2018 06/16/2018  PHQ - 2 Score 0 0 0 0     Fall Risk Fall Risk  05/02/2019  Falls in the past year? 0  Number falls in past yr: 0  Injury with Fall? 0    Cognitive Function:     6CIT Screen 05/02/2019  What Year? 0 points  What month? 0 points  What time? 0 points  Count back from 20 0 points  Months in reverse 0 points  Repeat phrase 0 points  Total Score 0    Patient Care Team: Theodoro Clock as PCP - General (Physician Assistant)     Plan:    1. Welcome to Medicare preventive visit  2. Screening for malignant neoplasm of colon - Fecal occult blood, imunochemical; Future  3. Hyperlipidemia - atorvastatin (LIPITOR) 40 MG tablet; Take 1 tablet (40 mg total) by mouth daily.  Dispense: 90 tablet; Refill: 3 - Lipid panel; Future  4. Hypothyroidism (acquired) - TSH; Future  5. Well adult exam - CBC with Differential/Platelet; Future - CMP14+EGFR; Future - TSH; Future - Lipid panel; Future - VITAMIN D 25 Hydroxy (Vit-D Deficiency, Fractures); Future  6. SVT (supraventricular tachycardia) ([place])  7. Encounter for Medicare annual wellness exam   I have personally reviewed and noted the following in the patient's chart:   . Medical and social history . Use of alcohol, tobacco or illicit drugs  . Current medications and supplements . Functional ability and status . Nutritional status . Physical activity . Advanced directives . List of other physicians . Hospitalizations, surgeries, and ER visits in previous 12  months . Vitals . Screenings to include cognitive, depression, and falls . Referrals and appointments  In addition, I have reviewed and discussed with patient certain preventive protocols, quality metrics, and best practice recommendations. A written personalized care plan for preventive services as well as general preventive health recommendations were provided to patient.     Terald Sleeper, PA-C 05/03/2019

## 2019-05-04 LAB — CMP14+EGFR
ALT: 37 IU/L — ABNORMAL HIGH (ref 0–32)
AST: 21 IU/L (ref 0–40)
Albumin/Globulin Ratio: 1.8 (ref 1.2–2.2)
Albumin: 4.2 g/dL (ref 3.8–4.8)
Alkaline Phosphatase: 89 IU/L (ref 39–117)
BUN/Creatinine Ratio: 20 (ref 12–28)
BUN: 18 mg/dL (ref 8–27)
Bilirubin Total: 0.5 mg/dL (ref 0.0–1.2)
CO2: 23 mmol/L (ref 20–29)
Calcium: 9.5 mg/dL (ref 8.7–10.3)
Chloride: 103 mmol/L (ref 96–106)
Creatinine, Ser: 0.92 mg/dL (ref 0.57–1.00)
GFR calc Af Amer: 76 mL/min/{1.73_m2} (ref >59)
GFR calc non Af Amer: 66 mL/min/{1.73_m2} (ref >59)
Globulin, Total: 2.3 g/dL (ref 1.5–4.5)
Glucose: 97 mg/dL (ref 65–99)
Potassium: 4.2 mmol/L (ref 3.5–5.2)
Sodium: 142 mmol/L (ref 134–144)
Total Protein: 6.5 g/dL (ref 6.0–8.5)

## 2019-05-04 LAB — CBC WITH DIFFERENTIAL/PLATELET
Basophils Absolute: 0.1 10*3/uL (ref 0.0–0.2)
Basos: 1 %
EOS (ABSOLUTE): 0.2 10*3/uL (ref 0.0–0.4)
Eos: 3 %
Hematocrit: 39.3 % (ref 34.0–46.6)
Hemoglobin: 12.9 g/dL (ref 11.1–15.9)
Immature Grans (Abs): 0 10*3/uL (ref 0.0–0.1)
Immature Granulocytes: 0 %
Lymphocytes Absolute: 2.1 10*3/uL (ref 0.7–3.1)
Lymphs: 32 %
MCH: 28.7 pg (ref 26.6–33.0)
MCHC: 32.8 g/dL (ref 31.5–35.7)
MCV: 87 fL (ref 79–97)
Monocytes Absolute: 0.5 10*3/uL (ref 0.1–0.9)
Monocytes: 7 %
Neutrophils Absolute: 3.7 10*3/uL (ref 1.4–7.0)
Neutrophils: 57 %
Platelets: 348 10*3/uL (ref 150–450)
RBC: 4.5 x10E6/uL (ref 3.77–5.28)
RDW: 11.9 % (ref 11.7–15.4)
WBC: 6.5 10*3/uL (ref 3.4–10.8)

## 2019-05-04 LAB — LIPID PANEL
Chol/HDL Ratio: 2.8 ratio (ref 0.0–4.4)
Cholesterol, Total: 139 mg/dL (ref 100–199)
HDL: 50 mg/dL (ref >39)
LDL Calculated: 70 mg/dL (ref 0–99)
Triglycerides: 93 mg/dL (ref 0–149)
VLDL Cholesterol Cal: 19 mg/dL (ref 5–40)

## 2019-05-04 LAB — FECAL OCCULT BLOOD, IMMUNOCHEMICAL: Fecal Occult Bld: NEGATIVE

## 2019-05-04 LAB — VITAMIN D 25 HYDROXY (VIT D DEFICIENCY, FRACTURES): Vit D, 25-Hydroxy: 29.8 ng/mL — ABNORMAL LOW (ref 30.0–100.0)

## 2019-05-04 LAB — TSH: TSH: 2.53 u[IU]/mL (ref 0.450–4.500)

## 2019-07-15 ENCOUNTER — Other Ambulatory Visit: Payer: Self-pay | Admitting: *Deleted

## 2019-07-15 DIAGNOSIS — Z20822 Contact with and (suspected) exposure to covid-19: Secondary | ICD-10-CM

## 2019-07-16 LAB — NOVEL CORONAVIRUS, NAA: SARS-CoV-2, NAA: NOT DETECTED

## 2019-08-09 ENCOUNTER — Other Ambulatory Visit: Payer: Self-pay

## 2019-08-09 ENCOUNTER — Ambulatory Visit (INDEPENDENT_AMBULATORY_CARE_PROVIDER_SITE_OTHER): Payer: Medicare Other

## 2019-08-09 DIAGNOSIS — Z23 Encounter for immunization: Secondary | ICD-10-CM | POA: Diagnosis not present

## 2019-08-15 ENCOUNTER — Other Ambulatory Visit: Payer: Self-pay | Admitting: Physician Assistant

## 2019-08-29 ENCOUNTER — Encounter (INDEPENDENT_AMBULATORY_CARE_PROVIDER_SITE_OTHER): Payer: BC Managed Care – PPO | Admitting: Ophthalmology

## 2019-08-30 ENCOUNTER — Encounter (INDEPENDENT_AMBULATORY_CARE_PROVIDER_SITE_OTHER): Payer: BC Managed Care – PPO | Admitting: Ophthalmology

## 2019-08-30 DIAGNOSIS — H2513 Age-related nuclear cataract, bilateral: Secondary | ICD-10-CM

## 2019-08-30 DIAGNOSIS — H33303 Unspecified retinal break, bilateral: Secondary | ICD-10-CM

## 2019-08-30 DIAGNOSIS — H43813 Vitreous degeneration, bilateral: Secondary | ICD-10-CM

## 2019-08-30 DIAGNOSIS — H35372 Puckering of macula, left eye: Secondary | ICD-10-CM | POA: Diagnosis not present

## 2019-11-09 ENCOUNTER — Other Ambulatory Visit: Payer: Self-pay | Admitting: Physician Assistant

## 2019-11-18 ENCOUNTER — Other Ambulatory Visit: Payer: Self-pay

## 2019-11-18 ENCOUNTER — Encounter: Payer: Self-pay | Admitting: Physician Assistant

## 2019-11-18 ENCOUNTER — Ambulatory Visit: Payer: Medicare Other | Admitting: Physician Assistant

## 2019-11-18 ENCOUNTER — Ambulatory Visit (INDEPENDENT_AMBULATORY_CARE_PROVIDER_SITE_OTHER): Payer: Medicare PPO

## 2019-11-18 VITALS — BP 115/80 | HR 77 | Temp 97.4°F | Resp 18 | Ht 65.0 in | Wt 142.0 lb

## 2019-11-18 DIAGNOSIS — M8580 Other specified disorders of bone density and structure, unspecified site: Secondary | ICD-10-CM

## 2019-11-18 DIAGNOSIS — E559 Vitamin D deficiency, unspecified: Secondary | ICD-10-CM

## 2019-11-18 DIAGNOSIS — E039 Hypothyroidism, unspecified: Secondary | ICD-10-CM

## 2019-11-18 DIAGNOSIS — F339 Major depressive disorder, recurrent, unspecified: Secondary | ICD-10-CM

## 2019-11-18 DIAGNOSIS — E7841 Elevated Lipoprotein(a): Secondary | ICD-10-CM

## 2019-11-18 DIAGNOSIS — Z780 Asymptomatic menopausal state: Secondary | ICD-10-CM

## 2019-11-18 MED ORDER — ESCITALOPRAM OXALATE 10 MG PO TABS: 10.0000 mg | ORAL_TABLET | Freq: Every day | ORAL | 3 refills | Status: DC

## 2019-11-18 MED ORDER — ATORVASTATIN CALCIUM 40 MG PO TABS: 40.0000 mg | ORAL_TABLET | Freq: Every day | ORAL | 3 refills | Status: DC

## 2019-11-18 NOTE — Patient Instructions (Signed)
[phone]  [address]  [place] [facility]

## 2019-11-19 LAB — CBC WITH DIFFERENTIAL/PLATELET
Basophils Absolute: 0.1 10*3/uL (ref 0.0–0.2)
Basos: 1 %
EOS (ABSOLUTE): 0.2 10*3/uL (ref 0.0–0.4)
Eos: 4 %
Hematocrit: 39.8 % (ref 34.0–46.6)
Hemoglobin: 13.1 g/dL (ref 11.1–15.9)
Immature Grans (Abs): 0 10*3/uL (ref 0.0–0.1)
Immature Granulocytes: 0 %
Lymphocytes Absolute: 1.7 10*3/uL (ref 0.7–3.1)
Lymphs: 29 %
MCH: 28.3 pg (ref 26.6–33.0)
MCHC: 32.9 g/dL (ref 31.5–35.7)
MCV: 86 fL (ref 79–97)
Monocytes Absolute: 0.6 10*3/uL (ref 0.1–0.9)
Monocytes: 10 %
Neutrophils Absolute: 3.3 10*3/uL (ref 1.4–7.0)
Neutrophils: 56 %
Platelets: 287 10*3/uL (ref 150–450)
RBC: 4.63 x10E6/uL (ref 3.77–5.28)
RDW: 12.3 % (ref 11.7–15.4)
WBC: 5.9 10*3/uL (ref 3.4–10.8)

## 2019-11-19 LAB — CMP14+EGFR
ALT: 33 IU/L — ABNORMAL HIGH (ref 0–32)
AST: 22 IU/L (ref 0–40)
Albumin/Globulin Ratio: 1.7 (ref 1.2–2.2)
Albumin: 4 g/dL (ref 3.8–4.8)
Alkaline Phosphatase: 107 IU/L (ref 39–117)
BUN/Creatinine Ratio: 17 (ref 12–28)
BUN: 16 mg/dL (ref 8–27)
Bilirubin Total: 0.7 mg/dL (ref 0.0–1.2)
CO2: 25 mmol/L (ref 20–29)
Calcium: 9.4 mg/dL (ref 8.7–10.3)
Chloride: 102 mmol/L (ref 96–106)
Creatinine, Ser: 0.95 mg/dL (ref 0.57–1.00)
GFR calc Af Amer: 72 mL/min/{1.73_m2} (ref >59)
GFR calc non Af Amer: 63 mL/min/{1.73_m2} (ref >59)
Globulin, Total: 2.3 g/dL (ref 1.5–4.5)
Glucose: 90 mg/dL (ref 65–99)
Potassium: 4.3 mmol/L (ref 3.5–5.2)
Sodium: 140 mmol/L (ref 134–144)
Total Protein: 6.3 g/dL (ref 6.0–8.5)

## 2019-11-19 LAB — LIPID PANEL
Chol/HDL Ratio: 2.8 ratio (ref 0.0–4.4)
Cholesterol, Total: 113 mg/dL (ref 100–199)
HDL: 41 mg/dL (ref >39)
LDL Chol Calc (NIH): 51 mg/dL (ref 0–99)
Triglycerides: 119 mg/dL (ref 0–149)
VLDL Cholesterol Cal: 21 mg/dL (ref 5–40)

## 2019-11-19 LAB — VITAMIN D 25 HYDROXY (VIT D DEFICIENCY, FRACTURES): Vit D, 25-Hydroxy: 31.2 ng/mL (ref 30.0–100.0)

## 2019-11-19 LAB — TSH: TSH: 7.1 u[IU]/mL — ABNORMAL HIGH (ref 0.450–4.500)

## 2019-11-21 ENCOUNTER — Telehealth: Payer: Self-pay | Admitting: Physician Assistant

## 2019-11-21 ENCOUNTER — Other Ambulatory Visit: Payer: Self-pay | Admitting: Physician Assistant

## 2019-11-21 MED ORDER — LEVOTHYROXINE SODIUM 100 MCG PO TABS: 100.0000 ug | ORAL_TABLET | Freq: Every day | ORAL | 1 refills | Status: DC

## 2019-11-21 NOTE — Telephone Encounter (Signed)
Pt called requesting to speak with nurse regarding her lab results and medication issue. Says the Thyroid Rx that was sent in for her is wrong.

## 2019-11-21 NOTE — Telephone Encounter (Signed)
Patient is aware mychart automatically  released results provider has not looked at yet will call once provider reviews

## 2019-11-22 DIAGNOSIS — E559 Vitamin D deficiency, unspecified: Secondary | ICD-10-CM | POA: Insufficient documentation

## 2019-11-22 DIAGNOSIS — F339 Major depressive disorder, recurrent, unspecified: Secondary | ICD-10-CM | POA: Insufficient documentation

## 2019-11-22 DIAGNOSIS — Z780 Asymptomatic menopausal state: Secondary | ICD-10-CM | POA: Insufficient documentation

## 2019-11-22 NOTE — [facility] Notes (Signed)
Subjective:    Patient ID: [name] [name], female    DOB: 09-21-1951, 69 y.o.   MRN: [id number]  Chief Complaint  Patient presents with  . Medical Management of Chronic Issues    Thyroid Problem Presents for follow-up visit. Patient reports no anxiety, cold intolerance, depressed mood, fatigue, hair loss, visual change, weight gain or weight loss. The symptoms have been stable. Her past medical history is significant for hyperlipidemia.  Hyperlipidemia This is a chronic problem. The current episode started more than 1 year ago. The problem is controlled. Current antihyperlipidemic treatment includes statins. The current treatment provides significant improvement of lipids.  Depression        This is a chronic problem.  The current episode started more than 1 year ago.   The problem has been resolved since onset.  Associated symptoms include no decreased concentration and no fatigue.  Past treatments include SSRIs - Selective serotonin reuptake inhibitors.  Compliance with treatment is good.  Past medical history includes thyroid problem.    *Patient also is postmenopausal that is why we are watching her osteopenia and a DEXA will be performed.  She also has vitamin D deficiency.  This lab will also be checked.  She does have some allergies but does not have any significant symptoms from that.  All of her medications are reviewed and refills will be sent as needed.  Past Medical History:  Diagnosis Date  . Allergy   . Anxiety   . BCC (basal cell carcinoma of skin) 07/31/2016   Superficial Bcc left shoulder  . History of cold sores   . Hyperlipidemia   . Osteopenia   . Rosacea   . Thyroid disease    hypothyroid    Past Surgical History:  Procedure Laterality Date  . skin cancer removal    . TUBAL LIGATION    . WISDOM TOOTH EXTRACTION      Family History  Problem Relation Age of Onset  . Glaucoma Mother   . Cerebral aneurysm Mother   . Stroke Father   . Hypertension  Father   . Lung cancer Father   . Heart attack Paternal Uncle   . Heart attack Maternal Uncle   . Brain cancer Brother   . Colon cancer Neg Hx   . Stomach cancer Neg Hx   . Esophageal cancer Neg Hx     Social History   Socioeconomic History  . Marital status: Married    Spouse name: Timmothy Sours   . Number of children: 1  . Years of education: 27  . Highest education level: High school graduate  Occupational History  . Occupation: Conservation officer, historic buildings: [employer]  Tobacco Use  . Smoking status: Never Smoker  . Smokeless tobacco: Never Used  Substance and Sexual Activity  . Alcohol use: No  . Drug use: No  . Sexual activity: Yes    Partners: Male  Other Topics Concern  . Not on file  Social History Narrative  . Not on file   Social Determinants of Health   Financial Resource Strain: Low Risk   . Difficulty of Paying Living Expenses: Not hard at all  Food Insecurity: No Food Insecurity  . Worried About Charity fundraiser in the Last Year: Never true  . Ran Out of Food in the Last Year: Never true  Transportation Needs: No Transportation Needs  . Lack of Transportation (Medical): No  . Lack of Transportation (Non-Medical): No  Physical Activity:  Sufficiently Active  . Days of Exercise per Week: 5 days  . Minutes of Exercise per Session: 40 min  Stress: No Stress Concern Present  . Feeling of Stress : Not at all  Social Connections: Not Isolated  . Frequency of Communication with Friends and Family: More than three times a week  . Frequency of Social Gatherings with Friends and Family: Twice a week  . Attends Religious Services: 1 to 4 times per year  . Active Member of Clubs or Organizations: Yes  . Attends Archivist Meetings: More than 4 times per year  . Marital Status: Married  Human resources officer Violence: Not At Risk  . Fear of Current or Ex-Partner: No  . Emotionally Abused: No  . Physically Abused: No  . Sexually Abused: No     Outpatient Medications Prior to Visit  Medication Sig Dispense Refill  . Calcium Carbonate-Vitamin D (CALCIUM 500/D PO) Take by mouth.    . cetirizine (ZYRTEC) 10 MG tablet Take 1 tablet (10 mg total) by mouth daily. 90 tablet 3  . Cholecalciferol (VITAMIN D3) 2000 UNITS TABS Take 1 tablet by mouth daily.      . clobetasol ointment (TEMOVATE) 0.05 %     . famotidine (PEPCID) 20 MG tablet Take 1 tablet (20 mg total) by mouth 2 (two) times daily.    . Multiple Vitamins-Minerals (ICAPS AREDS 2 PO) Take by mouth.    Marland Kitchen atorvastatin (LIPITOR) 40 MG tablet Take 1 tablet (40 mg total) by mouth daily. 90 tablet 3  . escitalopram (LEXAPRO) 10 MG tablet Take 10 mg by mouth daily.    Marland Kitchen levothyroxine (SYNTHROID) 88 MCG tablet TAKE 1 TABLET (88 MCG TOTAL) BY MOUTH DAILY BEFORE BREAKFAST. 90 tablet 1   No facility-administered medications prior to visit.    No Known Allergies  Review of Systems  Constitutional: Negative.  Negative for fatigue, weight gain and weight loss.  HENT: Negative.   Eyes: Negative.   Respiratory: Negative.   Gastrointestinal: Negative.   Endocrine: Negative for cold intolerance.  Genitourinary: Negative.   Psychiatric/Behavioral: Positive for depression. Negative for decreased concentration. The patient is not nervous/anxious.        Objective:    Physical Exam Constitutional:      General: She is not in acute distress.    Appearance: Normal appearance. She is well-developed.  HENT:     Head: Normocephalic and atraumatic.  Cardiovascular:     Rate and Rhythm: Normal rate.  Pulmonary:     Effort: Pulmonary effort is normal.  Skin:    General: Skin is warm and dry.     Findings: No rash.  Neurological:     Mental Status: She is alert and oriented to person, place, and time.     Deep Tendon Reflexes: Reflexes are normal and symmetric.     BP 115/80   Pulse 77   Temp (!) 97.4 F (36.3 C)   Resp 18   Ht '5\' 5"'  (1.651 m)   Wt 142 lb (64.4 kg)   SpO2  98%   BMI 23.63 kg/m  Wt Readings from Last 3 Encounters:  11/18/19 142 lb (64.4 kg)  05/02/19 139 lb 9.6 oz (63.3 kg)  11/23/18 136 lb (61.7 kg)    There are no preventive care reminders to display for this patient.  There are no preventive care reminders to display for this patient.   Lab Results  Component Value Date   TSH 7.100 (H) 11/18/2019   Lab  Results  Component Value Date   WBC 5.9 11/18/2019   HGB 13.1 11/18/2019   HCT 39.8 11/18/2019   MCV 86 11/18/2019   PLT 287 11/18/2019   Lab Results  Component Value Date   NA 140 11/18/2019   K 4.3 11/18/2019   CO2 25 11/18/2019   GLUCOSE 90 11/18/2019   BUN 16 11/18/2019   CREATININE 0.95 11/18/2019   BILITOT 0.7 11/18/2019   ALKPHOS 107 11/18/2019   AST 22 11/18/2019   ALT 33 (H) 11/18/2019   PROT 6.3 11/18/2019   ALBUMIN 4.0 11/18/2019   CALCIUM 9.4 11/18/2019   ANIONGAP 7 08/23/2018   Lab Results  Component Value Date   CHOL 113 11/18/2019   Lab Results  Component Value Date   HDL 41 11/18/2019   Lab Results  Component Value Date   LDLCALC 51 11/18/2019   Lab Results  Component Value Date   TRIG 119 11/18/2019   Lab Results  Component Value Date   CHOLHDL 2.8 11/18/2019   No results found for: HGBA1C     Assessment & Plan:   Problem List Items Addressed This Visit      Endocrine   Hypothyroidism (acquired)   Relevant Orders   DG [facility] DEXA (Completed)   CBC with Differential/Platelet (Completed)   CMP14+EGFR (Completed)   Lipid panel (Completed)   TSH (Completed)   VITAMIN D 25 Hydroxy (Vit-D Deficiency, Fractures) (Completed)     Musculoskeletal and Integument   Osteopenia - Primary   Relevant Orders   DG [facility] DEXA (Completed)   VITAMIN D 25 Hydroxy (Vit-D Deficiency, Fractures) (Completed)     Other   Hyperlipidemia   Relevant Medications   atorvastatin (LIPITOR) 40 MG tablet   Other Relevant Orders   DG [facility] DEXA (Completed)   CBC with Differential/Platelet (Completed)    CMP14+EGFR (Completed)   Lipid panel (Completed)   TSH (Completed)   VITAMIN D 25 Hydroxy (Vit-D Deficiency, Fractures) (Completed)   Postmenopausal   Relevant Orders   DG [facility] DEXA (Completed)   Vitamin D deficiency   Relevant Orders   DG [facility] DEXA (Completed)   VITAMIN D 25 Hydroxy (Vit-D Deficiency, Fractures) (Completed)    Other Visit Diagnoses    Depression, recurrent ([place])       Relevant Medications   escitalopram (LEXAPRO) 10 MG tablet       Meds ordered this encounter  Medications  . escitalopram (LEXAPRO) 10 MG tablet    Sig: Take 1 tablet (10 mg total) by mouth daily.    Dispense:  90 tablet    Refill:  3    Order Specific Question:   Supervising Provider    Answer:   Janora Norlander [[id number]]  . atorvastatin (LIPITOR) 40 MG tablet    Sig: Take 1 tablet (40 mg total) by mouth daily.    Dispense:  90 tablet    Refill:  3    Order Specific Question:   Supervising Provider    Answer:   Janora Norlander [[id number]]     Terald Sleeper, PA-C

## 2019-11-28 ENCOUNTER — Ambulatory Visit (INDEPENDENT_AMBULATORY_CARE_PROVIDER_SITE_OTHER): Payer: Medicare PPO | Admitting: Physician Assistant

## 2019-11-28 DIAGNOSIS — M8589 Other specified disorders of bone density and structure, multiple sites: Secondary | ICD-10-CM | POA: Diagnosis not present

## 2019-11-28 MED ORDER — CALCITONIN (SALMON) 200 UNIT/ACT NA SOLN: 1.0000 | Freq: Every day | NASAL | 12 refills | Status: DC

## 2019-11-28 NOTE — [facility] Notes (Signed)
       Telephone visit  Subjective: [id number] PCP: Terald Sleeper, PA-C [id number] [name] [name] is a 69 y.o. female calls for telephone consult today. Patient provides verbal consent for consult held via phone.  Patient is identified with 2 separate identifiers.  At this time the entire area is on COVID-19 social distancing and stay home orders are in place.  Patient is of higher risk and therefore we are performing this by a virtual method.  Location of patient: home Location of provider: [facility] Others present for call: no    This patient is having a conversation about her recent DEXA result.  All of the areas do show osteopenia except one area is 0.2 5/10 way from being osteoporosis.  She is fairly stable.  She does have a risk of 11.2% for major fracture and not including the hip.  We have discussed moving on to something like Prolia or Fosamax.  But she would like to wait for that we are going to change her calcium to Miacalcin nasal spray.  She will continue taking vitamin D supplement.  ROS: Per HPI  No Known Allergies Past Medical History:  Diagnosis Date  . Allergy   . Anxiety   . BCC (basal cell carcinoma of skin) 07/31/2016   Superficial Bcc left shoulder  . History of cold sores   . Hyperlipidemia   . Osteopenia   . Rosacea   . Thyroid disease    hypothyroid    Current Outpatient Medications:  .  atorvastatin (LIPITOR) 40 MG tablet, Take 1 tablet (40 mg total) by mouth daily., Disp: 90 tablet, Rfl: 3 .  calcitonin, salmon, (MIACALCIN/FORTICAL) 200 UNIT/ACT nasal spray, Place 1 spray into alternate nostrils daily., Disp: 3.7 mL, Rfl: 12 .  Calcium Carbonate-Vitamin D (CALCIUM 500/D PO), Take by mouth., Disp: , Rfl:  .  cetirizine (ZYRTEC) 10 MG tablet, Take 1 tablet (10 mg total) by mouth daily., Disp: 90 tablet, Rfl: 3 .  Cholecalciferol (VITAMIN D3) 2000 UNITS TABS, Take 1 tablet by mouth daily.  , Disp: , Rfl:  .  clobetasol ointment (TEMOVATE) 0.05 %, , Disp: ,  Rfl:  .  escitalopram (LEXAPRO) 10 MG tablet, Take 1 tablet (10 mg total) by mouth daily., Disp: 90 tablet, Rfl: 3 .  famotidine (PEPCID) 20 MG tablet, Take 1 tablet (20 mg total) by mouth 2 (two) times daily., Disp:  , Rfl:  .  levothyroxine (SYNTHROID) 100 MCG tablet, Take 1 tablet (100 mcg total) by mouth daily before breakfast., Disp: 90 tablet, Rfl: 1 .  Multiple Vitamins-Minerals (ICAPS AREDS 2 PO), Take by mouth., Disp: , Rfl:   Assessment/ Plan: 69 y.o. female   1. Osteopenia of multiple sites - calcitonin, salmon, (MIACALCIN/FORTICAL) 200 UNIT/ACT nasal spray; Place 1 spray into alternate nostrils daily.  Dispense: 3.7 mL; Refill: 12   No follow-ups on file.  Continue all other maintenance medications as listed above.  Start time: 2:47 PM End time: 2:57 PM  Meds ordered this encounter  Medications  . calcitonin, salmon, (MIACALCIN/FORTICAL) 200 UNIT/ACT nasal spray    Sig: Place 1 spray into alternate nostrils daily.    Dispense:  3.7 mL    Refill:  12    Order Specific Question:   Supervising Provider    Answer:   Janora Norlander [id number]    Particia Nearing PA-C [place] [phone]

## 2019-12-02 ENCOUNTER — Encounter: Payer: Self-pay | Admitting: Physician Assistant

## 2020-02-20 ENCOUNTER — Telehealth: Payer: Self-pay | Admitting: Physician Assistant

## 2020-02-20 ENCOUNTER — Other Ambulatory Visit: Payer: Self-pay | Admitting: *Deleted

## 2020-02-20 DIAGNOSIS — E039 Hypothyroidism, unspecified: Secondary | ICD-10-CM

## 2020-02-20 NOTE — Telephone Encounter (Signed)
Follow up labs ordered and appointment to establish care with Dr Lajuana Ripple scheduled.

## 2020-03-05 ENCOUNTER — Other Ambulatory Visit: Payer: Self-pay

## 2020-03-05 ENCOUNTER — Other Ambulatory Visit: Payer: Medicare PPO

## 2020-03-05 DIAGNOSIS — E039 Hypothyroidism, unspecified: Secondary | ICD-10-CM | POA: Diagnosis not present

## 2020-03-06 LAB — THYROID PANEL WITH TSH
Free Thyroxine Index: 2.5 (ref 1.2–4.9)
T3 Uptake Ratio: 28 % (ref 24–39)
T4, Total: 9.1 ug/dL (ref 4.5–12.0)
TSH: 0.082 u[IU]/mL — ABNORMAL LOW (ref 0.450–4.500)

## 2020-03-08 NOTE — [facility] Notes (Signed)
Subjective: CC: hypothyroidism, HLD, anxiety disorder, est care PCP: Janora Norlander, DO [name] [name] is a 69 y.o. female presenting to clinic today for:  1. Hypothyroidism, acquired History: Insidious onset.  No history of radiation or surgery to the neck.  She does have family history in her mother of hypothyroidism but no other known family history.  She has never been symptomatic.  Patient reports compliance with Synthroid 100 mcg daily.  This was a change from her previous dose of 88 mcg daily.  She does note that she takes this at nighttime about 4 to 5 hours after supper with her cholesterol medicine.  He denies any heart palpitations, difficulty swallowing, tremor, increased anxiety, insomnia, change in bowel habit or weight.  2. HLD Compliant with atorvastatin.  No chest pain, shortness of breath.  She is physically active.  3.  Generalized anxiety disorder Longstanding history.  No history of SI, HI, hospitalizations for mental health.  She reports good control of symptoms with Lexapro 10 mg daily.  She is never been severely anxious but notes that this does seem to mellow her out such that she can complete normal tasks without difficulty.  4. Osteopenia Last DEXA scan was in February which showed a T score of -2.2.  She did not have significant risk for fracture to support initiation of medication.  She was initially recommended Fosamax but was reluctant to start that so Miacalcin was prescribed.  However, she never started it given its risk of cancer and being pulled off the market in [place].  She reports compliance with calcium and vitamin D.  She tries to stay physically active as above.  5.  GERD Patient does report nighttime coughing that is mild.  She is compliant with Pepcid twice daily.  She is actually seeing gastroenterology with regards to symptoms previously.  She was on pantoprazole but then switched to Pepcid.  She was not a candidate for EGD given mild  symptoms.  She is wondering if there is anything that she can do for these nighttime cough.  She denies any postnasal drip.  ROS: Per HPI  No Known Allergies Past Medical History:  Diagnosis Date  . Allergy   . Anxiety   . BCC (basal cell carcinoma of skin) 07/31/2016   Superficial Bcc left shoulder  . History of cold sores   . Hyperlipidemia   . Osteopenia   . Rosacea   . Thyroid disease    hypothyroid    Current Outpatient Medications:  .  atorvastatin (LIPITOR) 40 MG tablet, Take 1 tablet (40 mg total) by mouth daily., Disp: 90 tablet, Rfl: 3 .  Calcium Carbonate-Vitamin D (CALCIUM 500/D PO), Take by mouth., Disp: , Rfl:  .  cetirizine (ZYRTEC) 10 MG tablet, Take 1 tablet (10 mg total) by mouth daily., Disp: 90 tablet, Rfl: 3 .  Cholecalciferol (VITAMIN D3) 2000 UNITS TABS, Take 1 tablet by mouth daily.  , Disp: , Rfl:  .  clobetasol ointment (TEMOVATE) 0.05 %, , Disp: , Rfl:  .  escitalopram (LEXAPRO) 10 MG tablet, Take 1 tablet (10 mg total) by mouth daily., Disp: 90 tablet, Rfl: 3 .  famotidine (PEPCID) 20 MG tablet, Take 1 tablet (20 mg total) by mouth 2 (two) times daily., Disp:  , Rfl:  .  levothyroxine (SYNTHROID) 88 MCG tablet, Take 1 tablet (88 mcg total) by mouth daily., Disp: 90 tablet, Rfl: 0 Social History   Socioeconomic History  . Marital status: Married    Spouse  name: Timmothy Sours   . Number of children: 1  . Years of education: 44  . Highest education level: High school graduate  Occupational History  . Occupation: Conservation officer, historic buildings: [employer]  Tobacco Use  . Smoking status: Never Smoker  . Smokeless tobacco: Never Used  Substance and Sexual Activity  . Alcohol use: No  . Drug use: No  . Sexual activity: Yes    Partners: Male  Other Topics Concern  . Not on file  Social History Narrative   Patient is married.  Her husband recently retired last March and they have been trying to travel more.   She has a daughter who lives locally.  She  has grandchildren.  She is active in her church and tries to stay physically active.   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  She has been retired for a couple of years and is enjoying retirement quite a Holiday representative Strain: Low Risk   . Difficulty of Paying Living Expenses: Not hard at all  Food Insecurity: No Food Insecurity  . Worried About Charity fundraiser in the Last Year: Never true  . Ran Out of Food in the Last Year: Never true  Transportation Needs: No Transportation Needs  . Lack of Transportation (Medical): No  . Lack of Transportation (Non-Medical): No  Physical Activity: Sufficiently Active  . Days of Exercise per Week: 5 days  . Minutes of Exercise per Session: 40 min  Stress: No Stress Concern Present  . Feeling of Stress : Not at all  Social Connections: Not Isolated  . Frequency of Communication with Friends and Family: More than three times a week  . Frequency of Social Gatherings with Friends and Family: Twice a week  . Attends Religious Services: 1 to 4 times per year  . Active Member of Clubs or Organizations: Yes  . Attends Archivist Meetings: More than 4 times per year  . Marital Status: Married  Human resources officer Violence: Not At Risk  . Fear of Current or Ex-Partner: No  . Emotionally Abused: No  . Physically Abused: No  . Sexually Abused: No   Family History  Problem Relation Age of Onset  . Glaucoma Mother   . Cerebral aneurysm Mother   . Stroke Father   . Hypertension Father   . Lung cancer Father   . Heart attack Paternal Uncle   . Heart attack Maternal Uncle   . Brain cancer Brother   . Colon cancer Neg Hx   . Stomach cancer Neg Hx   . Esophageal cancer Neg Hx     Objective: Office vital signs reviewed. BP 102/68   Pulse 84   Temp (!) 97 F (36.1 C)   Ht 5' 5" (1.651 m)   Wt 136 lb 9.6 oz (62 kg)   SpO2 99%   BMI 22.73  kg/m   Physical Examination:  General: Awake, alert, well nourished, No acute distress HEENT: Normal, sclera white, MMM. no goiter.  Thyroid fulfilling but without nodules or masses.  No exophthalmos. Cardio: regular rate and rhythm, S1S2 heard, no murmurs appreciated Pulm: clear to auscultation bilaterally, no wheezes, rhonchi or rales; normal work of breathing on room air Extremities: warm, well perfused, No edema, cyanosis or clubbing; +2 pulses bilaterally MSK: Normal gait and station Skin: dry; intact; no rashes or lesions; normal  temperature Neuro: No tremor Psych: Mood stable, speech normal, affect appropriate, pleasant and interactive.  Does not appear to be responding to internal stimuli.  Depression screen [facility] 2/9 03/09/2020 11/18/2019 05/02/2019  Decreased Interest 0 0 0  Down, Depressed, Hopeless 0 0 0  PHQ - 2 Score 0 0 0    Assessment/ Plan: 69 y.o. female   1. Hypothyroidism (acquired) Asymptomatic.  Her thyroid levels were suppressed.  We have switched her back to levothyroxine 88 mcg daily.  She will switch to a.m. schedule with this on an empty stomach separate from other food or medication by at least 30 minutes.  She will come back in in about 6 to 8 weeks for repeat thyroid panel.  Further recommendations pending this.  We discussed the remote possibility of alternating 88 and 100 mcg pending her next thyroid panel. - Thyroid Panel With TSH; Future  2. Generalized anxiety disorder Well-controlled with Lexapro  3. Elevated lipoprotein(a) Continue statin.  Not due for fasting lipid until February  4. Establishing care with new doctor, encounter for  5. Osteopenia of multiple sites I reviewed her last DEXA scan.  Uncertain as to why any type of medication was initiated.  We discussed adequate calcium and vitamin D.  Strength training.  Handout was provided.  We will plan to follow-up on repeat DEXA in 2 years  6. Gastroesophageal reflux disease without  esophagitis Trial of PPI for 2 weeks in addition to her Pepcid.  We discussed that long-term PPI use can result in worsening calcium and vitamin D absorption as well as other issues.  If we can avoid daily PPI use I would recommend it.  She will follow-up as needed   Orders Placed This Encounter  Procedures  . Thyroid Panel With TSH    Standing Status:   Future    Standing Expiration Date:   03/09/2021  . Thyroid Panel With TSH    Standing Status:   Future    Standing Expiration Date:   03/09/2021  . Lipid panel    Standing Status:   Future    Standing Expiration Date:   03/09/2021  . CMP14+EGFR    Standing Status:   Future    Standing Expiration Date:   03/09/2021  . VITAMIN D 25 Hydroxy (Vit-D Deficiency, Fractures)    Standing Status:   Future    Standing Expiration Date:   03/09/2021   Meds ordered this encounter  Medications  . levothyroxine (SYNTHROID) 88 MCG tablet    Sig: Take 1 tablet (88 mcg total) by mouth daily.    Dispense:  90 tablet    Refill:  [place], DO [place] [phone]

## 2020-03-09 ENCOUNTER — Ambulatory Visit: Payer: Medicare PPO | Admitting: Family Medicine

## 2020-03-09 ENCOUNTER — Other Ambulatory Visit: Payer: Self-pay

## 2020-03-09 ENCOUNTER — Encounter: Payer: Self-pay | Admitting: Family Medicine

## 2020-03-09 VITALS — BP 102/68 | HR 84 | Temp 97.0°F | Ht 65.0 in | Wt 136.6 lb

## 2020-03-09 DIAGNOSIS — K219 Gastro-esophageal reflux disease without esophagitis: Secondary | ICD-10-CM

## 2020-03-09 DIAGNOSIS — Z7689 Persons encountering health services in other specified circumstances: Secondary | ICD-10-CM

## 2020-03-09 DIAGNOSIS — E7841 Elevated Lipoprotein(a): Secondary | ICD-10-CM | POA: Diagnosis not present

## 2020-03-09 DIAGNOSIS — F411 Generalized anxiety disorder: Secondary | ICD-10-CM

## 2020-03-09 DIAGNOSIS — M8589 Other specified disorders of bone density and structure, multiple sites: Secondary | ICD-10-CM | POA: Diagnosis not present

## 2020-03-09 DIAGNOSIS — E039 Hypothyroidism, unspecified: Secondary | ICD-10-CM | POA: Diagnosis not present

## 2020-03-09 MED ORDER — LEVOTHYROXINE SODIUM 88 MCG PO TABS: 88.0000 ug | ORAL_TABLET | Freq: Every day | ORAL | 0 refills | Status: DC

## 2020-03-09 NOTE — Patient Instructions (Addendum)
Omeprazole 20mg  every evening with supper x2 weeks.  Continue Pepcid (famotidine) twice daily.  Ok to repeat 2 week of Omeprazole if needed.   Your bone density did NOT meed guidelines for starting osteoporosis treatment.  Follow dietary and activity recommendations.  We will recheck in 2 years.  Synthroid in the morning separated from food/ medication for at least 30 minutes.  Come back in 6-8 weeks to recheck levels  See me in 35months

## 2020-04-27 ENCOUNTER — Other Ambulatory Visit: Payer: Medicare PPO

## 2020-04-27 ENCOUNTER — Other Ambulatory Visit: Payer: Self-pay

## 2020-04-27 DIAGNOSIS — E039 Hypothyroidism, unspecified: Secondary | ICD-10-CM | POA: Diagnosis not present

## 2020-04-28 LAB — THYROID PANEL WITH TSH
Free Thyroxine Index: 3.4 (ref 1.2–4.9)
T3 Uptake Ratio: 33 % (ref 24–39)
T4, Total: 10.3 ug/dL (ref 4.5–12.0)
TSH: 0.058 u[IU]/mL — ABNORMAL LOW (ref 0.450–4.500)

## 2020-04-30 MED ORDER — LEVOTHYROXINE SODIUM 75 MCG PO TABS
75.0000 ug | ORAL_TABLET | Freq: Every day | ORAL | 2 refills | Status: DC
Start: 2020-04-30 — End: 2020-05-31

## 2020-04-30 NOTE — Addendum Note (Signed)
Addended by: Baldomero Lamy B on: 04/30/2020 09:17 AM   Modules accepted: Orders

## 2020-05-03 DIAGNOSIS — Z1231 Encounter for screening mammogram for malignant neoplasm of breast: Secondary | ICD-10-CM | POA: Diagnosis not present

## 2020-05-10 ENCOUNTER — Ambulatory Visit: Payer: Medicare Other | Admitting: Physician Assistant

## 2020-05-16 ENCOUNTER — Ambulatory Visit (INDEPENDENT_AMBULATORY_CARE_PROVIDER_SITE_OTHER): Payer: Medicare PPO

## 2020-05-16 DIAGNOSIS — Z0000 Encounter for general adult medical examination without abnormal findings: Secondary | ICD-10-CM

## 2020-05-16 NOTE — [facility] Notes (Signed)
MEDICARE ANNUAL WELLNESS VISIT  05/16/2020  Telephone Visit Disclaimer This Medicare AWV was conducted by telephone due to national recommendations for restrictions regarding the COVID-19 Pandemic (e.g. social distancing).  I verified, using two identifiers, that I am speaking with [name] [name] or their authorized healthcare agent. I discussed the limitations, risks, security, and privacy concerns of performing an evaluation and management service by telephone and the potential availability of an in-person appointment in the future. The patient expressed understanding and agreed to proceed.   Subjective:  [name] [name] is a 69 y.o. female patient of [name] Norlander, DO who had a Medicare Annual Wellness Visit today via telephone. [name] [name] lives locally here in [place]. She was at home during our telephone call and I was in the office at [place]. She is a very pleasant lady who is currently retired and has been so for 2 years. She was employed by [employer] and worked in [employer]. She enjoys reading, church activities, and playing games on her IPAD in her free time. She goes to the recreation center in [place] twice weekly for exercise and walks when she can. She lives with her husband. They have 1 child and 2 grandchildren that live close by.   Patient Care Team: [name] Norlander, DO as PCP - General (Family Medicine) [name] Pedro, MD as Consulting Physician (Ophthalmology)  Advanced Directives 05/16/2020 05/02/2019 08/22/2018  Does Patient Have a Medical Advance Directive? No Yes No  Type of Advance Directive - [place] in Chart? - No - copy requested -  Would patient like information on creating a medical advance directive? No - Patient declined No - Patient declined No - Patient declined    Hospital Utilization Over the Past 12 Months: # of hospitalizations or ER visits:  0 # of surgeries: 0  Review of Systems    Patient reports that her overall health is unchanged compared to last year.  History obtained from chart review  Patient Reported Readings (BP, Pulse, CBG, Weight, etc) none  Pain Assessment Pain : No/denies pain     Current Medications & Allergies (verified) Allergies as of 05/16/2020   No Known Allergies     Medication List       Accurate as of May 16, 2020  2:52 PM. If you have any questions, ask your nurse or doctor.        atorvastatin 40 MG tablet Commonly known as: LIPITOR Take 1 tablet (40 mg total) by mouth daily.   CALCIUM 500/D PO Take by mouth.   cetirizine 10 MG tablet Commonly known as: ZYRTEC Take 1 tablet (10 mg total) by mouth daily.   clobetasol ointment 0.05 % Commonly known as: TEMOVATE   escitalopram 10 MG tablet Commonly known as: LEXAPRO Take 1 tablet (10 mg total) by mouth daily.   famotidine 20 MG tablet Commonly known as: PEPCID Take 1 tablet (20 mg total) by mouth 2 (two) times daily.   levothyroxine 75 MCG tablet Commonly known as: SYNTHROID Take 1 tablet (75 mcg total) by mouth daily.   Vitamin D3 50 MCG (2000 UT) Tabs Take 1 tablet by mouth daily.       History (reviewed): Past Medical History:  Diagnosis Date  . Allergy   . Anxiety   . BCC (basal cell carcinoma of skin) 07/31/2016   Superficial Bcc left shoulder  . History of cold sores   .  Hyperlipidemia   . Osteopenia   . Rosacea   . Thyroid disease    hypothyroid   Past Surgical History:  Procedure Laterality Date  . skin cancer removal    . TUBAL LIGATION    . WISDOM TOOTH EXTRACTION     Family History  Problem Relation Age of Onset  . Glaucoma Mother   . Cerebral aneurysm Mother   . Macular degeneration Mother   . Stroke Father   . Hypertension Father   . Lung cancer Father   . Heart attack Paternal Uncle   . Heart attack Maternal Uncle   . Brain cancer Brother   . Colon cancer Neg Hx   . Stomach  cancer Neg Hx   . Esophageal cancer Neg Hx    Social History   Socioeconomic History  . Marital status: Married    Spouse name: [name] [name]   . Number of children: 1  . Years of education: 45  . Highest education level: High school graduate  Occupational History  . Occupation: Conservation officer, historic buildings: [employer]  Tobacco Use  . Smoking status: Never Smoker  . Smokeless tobacco: Never Used  Vaping Use  . Vaping Use: Never used  Substance and Sexual Activity  . Alcohol use: No  . Drug use: No  . Sexual activity: Yes    Partners: Male  Other Topics Concern  . Not on file  Social History Narrative   Patient is married.  Her husband recently retired last March and they have been trying to travel more.   She has a daughter who lives locally.  She has grandchildren.  She is active in her church and tries to stay physically active.   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 Difficulty of Paying Living Expenses:   Food Insecurity:   . Worried About Charity fundraiser in the Last Year:   . Arboriculturist in the Last Year:   Transportation Needs:   . Film/video editor (Medical):   Marland Kitchen Lack of Transportation (Non-Medical):   Physical Activity:   . Days of Exercise per Week:   . Minutes of Exercise per Session:   Stress:   . Feeling of Stress :   Social Connections:   . Frequency of Communication with Friends and Family:   . Frequency of Social Gatherings with Friends and Family:   . Attends Religious Services:   . Active Member of Clubs or Organizations:   . Attends Archivist Meetings:   Marland Kitchen Marital Status:     Activities of Daily Living In your present state of health, do you have any difficulty performing the following activities: 05/16/2020  Hearing? N    Vision? Y  Comment Wears glasses all the time  Difficulty concentrating or making decisions? N  Walking or climbing stairs? N  Dressing or bathing? N  Doing errands, shopping? N  Preparing Food and eating ? N  Using the Toilet? N  In the past six months, have you accidently leaked urine? N  Do you have problems with loss of bowel control? N  Managing your Medications? N  Managing your Finances? N  Housekeeping or managing your Housekeeping? N  Some recent data might be  hidden    Patient Education/ Literacy How often do you need to have someone help you when you read instructions, pamphlets, or other written materials from your doctor or pharmacy?: 1 - Never What is the last grade level you completed in school?: High school graduate  Exercise Current Exercise Habits: Structured exercise class, Type of exercise: walking;Other - see comments (Goes to the recreation center twice weekly), Time (Minutes): 30, Frequency (Times/Week): 4, Weekly Exercise (Minutes/Week): 120, Intensity: Moderate, Exercise limited by: None identified  Diet Patient reports consuming 3 meals a day and 2 snack(s) a day Patient reports that her primary diet is: Regular Patient reports that she does have regular access to food.   Depression Screen PHQ 2/9 Scores 03/09/2020 11/18/2019 05/02/2019 10/19/2018 09/17/2018 06/16/2018 05/19/2018  PHQ - 2 Score 0 0 0 0 0 0 0     Fall Risk Fall Risk  05/16/2020 03/09/2020 11/18/2019 05/02/2019 10/19/2018  Falls in the past year? 0 0 0 0 0  Number falls in past yr: - - - 0 -  Injury with Fall? - - - 0 -     Objective:  [name] [name] seemed alert and oriented and she participated appropriately during our telephone visit.  Blood Pressure Weight BMI  BP Readings from Last 3 Encounters:  03/09/20 102/68  11/18/19 115/80  05/02/19 112/73   Wt Readings from Last 3 Encounters:  03/09/20 136 lb 9.6 oz (62 kg)  11/18/19 142 lb (64.4 kg)  05/02/19 139 lb 9.6 oz (63.3 kg)   BMI  Readings from Last 1 Encounters:  03/09/20 22.73 kg/m    *Unable to obtain current vital signs, weight, and BMI due to telephone visit type  Hearing/Vision  . [name] [name] did not seem to have difficulty with hearing/understanding during the telephone conversation . Reports that she has had a formal eye exam by an eye care professional within the past year . Reports that she has not had a formal hearing evaluation within the past year *Unable to fully assess hearing and vision during telephone visit type  Cognitive Function: 6CIT Screen 05/16/2020 05/02/2019  What Year? 0 points 0 points  What month? 0 points 0 points  What time? 0 points 0 points  Count back from 20 0 points 0 points  Months in reverse 0 points 0 points  Repeat phrase 0 points 0 points  Total Score 0 0   (Normal:0-7, Significant for Dysfunction: >8)  Normal Cognitive Function Screening: Yes   Immunization & Health Maintenance Record Immunization History  Administered Date(s) Administered  . Fluad Quad(high Dose 65+) 08/09/2019  . Pneumococcal Conjugate-13 05/02/2019  . Tdap 04/21/2011  . Zoster 05/14/2011  . Zoster Recombinat (Shingrix) 10/19/2018, 04/11/2019    Health Maintenance  Topic Date Due  . COLON CANCER SCREENING ANNUAL FOBT  05/02/2020  . PNA vac Low Risk Adult (2 of 2 - PPSV23) 05/01/2020  . INFLUENZA VACCINE  05/13/2020  . TETANUS/TDAP  04/20/2021  . COLONOSCOPY  07/20/2021  . DEXA SCAN  11/17/2021  . MAMMOGRAM  05/03/2022  . Hepatitis C Screening  Completed  . COVID-19 Vaccine  Discontinued       Assessment  This is a routine wellness examination for [name] [name].  Health Maintenance: Due or Overdue Health Maintenance Due  Topic Date Due  . COLON CANCER SCREENING ANNUAL FOBT  05/02/2020  . PNA vac Low Risk Adult (2 of 2 - PPSV23) 05/01/2020  . INFLUENZA VACCINE  05/13/2020    [name] [name] does not need a  referral for Community Assistance: Care Management:   no Social  Work:    no Prescription Assistance:  no Nutrition/Diabetes Education:  no   Plan:  Personalized Goals Goals Addressed            This Visit's Progress   . DIET - EAT MORE FRUITS AND VEGETABLES      . Exercise 150 min/wk Moderate Activity        Personalized Health Maintenance & Screening Recommendations  Patient states that she will look in to starting an advance directive  Lung Cancer Screening Recommended: no (Low Dose CT Chest recommended if Age 3-80 years, 30 pack-year currently smoking OR have quit w/in past 15 years) Hepatitis C Screening recommended: completed HIV Screening recommended: no  Advanced Directives: Written information was not prepared per patient's request.  Referrals & Orders No orders of the defined types were placed in this encounter.   Follow-up Plan . Follow-up with [name] Norlander, DO as planned     I have personally reviewed and noted the following in the patient's chart:   . Medical and social history . Use of alcohol, tobacco or illicit drugs  . Current medications and supplements . Functional ability and status . Nutritional status . Physical activity . Advanced directives . List of other physicians . Hospitalizations, surgeries, and ER visits in previous 12 months . Vitals . Screenings to include cognitive, depression, and falls . Referrals and appointments  In addition, I have reviewed and discussed with [name] [name] certain preventive protocols, quality metrics, and best practice recommendations. A written personalized care plan for preventive services as well as general preventive health recommendations is available and can be mailed to the patient at her request.      Rolena Infante LPN [id number]/20/[id number]

## 2020-05-16 NOTE — Patient Instructions (Signed)
  [name] [name] , Thank you for taking time to come for your Medicare Wellness Visit. I appreciate your ongoing commitment to your health goals. Please review the following plan we discussed and let me know if I can assist you in the future.   These are the goals we discussed: Goals    . DIET - EAT MORE FRUITS AND VEGETABLES    . Exercise 150 min/wk Moderate Activity    . Welcome to Medicare       This is a list of the screening recommended for you and due dates:  Health Maintenance  Topic Date Due  . Stool Blood Test  05/02/2020  . Pneumonia vaccines (2 of 2 - PPSV23) 05/01/2020  . Flu Shot  05/13/2020  . Tetanus Vaccine  04/20/2021  . Colon Cancer Screening  07/20/2021  . DEXA scan (bone density measurement)  11/17/2021  . Mammogram  05/03/2022  .  Hepatitis C: One time screening is recommended by Center for Disease Control  (CDC) for  adults born from [birth year] through 1963.   Completed  . COVID-19 Vaccine  Discontinued

## 2020-05-28 ENCOUNTER — Other Ambulatory Visit: Payer: Self-pay

## 2020-05-28 ENCOUNTER — Other Ambulatory Visit: Payer: Medicare PPO

## 2020-05-28 DIAGNOSIS — M8589 Other specified disorders of bone density and structure, multiple sites: Secondary | ICD-10-CM

## 2020-05-28 DIAGNOSIS — E039 Hypothyroidism, unspecified: Secondary | ICD-10-CM | POA: Diagnosis not present

## 2020-05-28 DIAGNOSIS — E7841 Elevated Lipoprotein(a): Secondary | ICD-10-CM | POA: Diagnosis not present

## 2020-05-29 LAB — LIPID PANEL
Chol/HDL Ratio: 3 ratio (ref 0.0–4.4)
Cholesterol, Total: 127 mg/dL (ref 100–199)
HDL: 42 mg/dL (ref >39)
LDL Chol Calc (NIH): 61 mg/dL (ref 0–99)
Triglycerides: 137 mg/dL (ref 0–149)
VLDL Cholesterol Cal: 24 mg/dL (ref 5–40)

## 2020-05-29 LAB — VITAMIN D 25 HYDROXY (VIT D DEFICIENCY, FRACTURES): Vit D, 25-Hydroxy: 53.9 ng/mL (ref 30.0–100.0)

## 2020-05-29 LAB — CMP14+EGFR
ALT: 21 IU/L (ref 0–32)
AST: 19 IU/L (ref 0–40)
Albumin/Globulin Ratio: 1.5 (ref 1.2–2.2)
Albumin: 3.8 g/dL (ref 3.8–4.8)
Alkaline Phosphatase: 91 IU/L (ref 48–121)
BUN/Creatinine Ratio: 15 (ref 12–28)
BUN: 13 mg/dL (ref 8–27)
Bilirubin Total: 0.6 mg/dL (ref 0.0–1.2)
CO2: 22 mmol/L (ref 20–29)
Calcium: 9.1 mg/dL (ref 8.7–10.3)
Chloride: 105 mmol/L (ref 96–106)
Creatinine, Ser: 0.88 mg/dL (ref 0.57–1.00)
GFR calc Af Amer: 79 mL/min/{1.73_m2} (ref >59)
GFR calc non Af Amer: 69 mL/min/{1.73_m2} (ref >59)
Globulin, Total: 2.5 g/dL (ref 1.5–4.5)
Glucose: 96 mg/dL (ref 65–99)
Potassium: 4.2 mmol/L (ref 3.5–5.2)
Sodium: 140 mmol/L (ref 134–144)
Total Protein: 6.3 g/dL (ref 6.0–8.5)

## 2020-05-29 LAB — THYROID PANEL WITH TSH
Free Thyroxine Index: 2.4 (ref 1.2–4.9)
T3 Uptake Ratio: 28 % (ref 24–39)
T4, Total: 8.6 ug/dL (ref 4.5–12.0)
TSH: 0.11 u[IU]/mL — ABNORMAL LOW (ref 0.450–4.500)

## 2020-05-31 ENCOUNTER — Ambulatory Visit: Payer: Medicare PPO | Admitting: Family Medicine

## 2020-05-31 ENCOUNTER — Other Ambulatory Visit: Payer: Self-pay

## 2020-05-31 ENCOUNTER — Encounter: Payer: Self-pay | Admitting: Family Medicine

## 2020-05-31 VITALS — BP 101/63 | HR 84 | Temp 97.5°F | Ht 65.0 in | Wt 139.6 lb

## 2020-05-31 DIAGNOSIS — Z23 Encounter for immunization: Secondary | ICD-10-CM

## 2020-05-31 DIAGNOSIS — E039 Hypothyroidism, unspecified: Secondary | ICD-10-CM

## 2020-05-31 MED ORDER — LEVOTHYROXINE SODIUM 50 MCG PO TABS: 50.0000 ug | ORAL_TABLET | Freq: Every day | ORAL | 3 refills | Status: DC

## 2020-05-31 NOTE — Progress Notes (Signed)
Subjective: CC: hypothyroidism  PCP: [name] Norlander, DO [name] [name] is a 69 y.o. female presenting to clinic today for:  1. Hypothyroidism, acquired History: Insidious onset.  No history of radiation or surgery to the neck.  She does have family history in her mother of hypothyroidism but no other known family history.  She has never been symptomatic.  Patient reports compliance with Synthroid 75 mcg daily.  She has had 3 thyroid panels in a row which showed suppressed TSH.  Likely, she is asymptomatic.  No change in voice, difficulty swallowing, tremor, heart palpitation, change in weight or bowel habits   ROS: Per HPI  No Known Allergies Past Medical History:  Diagnosis Date   Allergy    Anxiety    BCC (basal cell carcinoma of skin) 07/31/2016   Superficial Bcc left shoulder   History of cold sores    Hyperlipidemia    Osteopenia    Rosacea    Thyroid disease    hypothyroid    Current Outpatient Medications:    atorvastatin (LIPITOR) 40 MG tablet, Take 1 tablet (40 mg total) by mouth daily., Disp: 90 tablet, Rfl: 3   Calcium Carbonate-Vitamin D (CALCIUM 500/D PO), Take by mouth., Disp: , Rfl:    cetirizine (ZYRTEC) 10 MG tablet, Take 1 tablet (10 mg total) by mouth daily., Disp: 90 tablet, Rfl: 3   Cholecalciferol (VITAMIN D3) 2000 UNITS TABS, Take 1 tablet by mouth daily.  , Disp: , Rfl:    clobetasol ointment (TEMOVATE) 0.05 %, , Disp: , Rfl:    escitalopram (LEXAPRO) 10 MG tablet, Take 1 tablet (10 mg total) by mouth daily., Disp: 90 tablet, Rfl: 3   famotidine (PEPCID) 20 MG tablet, Take 1 tablet (20 mg total) by mouth 2 (two) times daily., Disp:  , Rfl:    levothyroxine (SYNTHROID) 75 MCG tablet, Take 1 tablet (75 mcg total) by mouth daily., Disp: 30 tablet, Rfl: 2 Social History   Socioeconomic History   Marital status: Married    Spouse name: [name] [name]    Number of children: 1   Years of education: 12   Highest education level: High  school graduate  Occupational History   Occupation: Conservation officer, historic buildings: [employer]  Tobacco Use   Smoking status: Never Smoker   Smokeless tobacco: Never Used  Scientific laboratory technician Use: Never used  Substance and Sexual Activity   Alcohol use: No   Drug use: No   Sexual activity: Yes    Partners: Male  Other Topics Concern   Not on file  Social History Narrative   Patient is married.  Her husband recently retired last March and they have been trying to travel more.   She has a daughter who lives locally.  She has grandchildren.  She is active in her church and tries to stay physically active.   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  She has been retired for a couple of years and is enjoying retirement quite a IT trainer:    Difficulty of Paying Living Expenses: Not on Art therapist Insecurity:    Worried About Charity fundraiser in the Last Year: Not on file   [employer] of Food in the Last Year: Not on file  Transportation Needs:    Film/video editor (Medical): Not on file  Lack of Transportation (Non-Medical): Not on file  Physical Activity:    Days of Exercise per Week: Not on file   Minutes of Exercise per Session: Not on file  Stress:    Feeling of Stress : Not on file  Social Connections:    Frequency of Communication with Friends and Family: Not on file   Frequency of Social Gatherings with Friends and Family: Not on file   Attends Religious Services: Not on file   Active Member of Clubs or Organizations: Not on file   Attends Archivist Meetings: Not on file   Marital Status: Not on file  Intimate Partner Violence:    Fear of Current or Ex-Partner: Not on file   Emotionally Abused: Not on file   Physically Abused: Not on file   Sexually Abused: Not on file   Family History  Problem Relation Age of  Onset   Glaucoma Mother    Cerebral aneurysm Mother    Macular degeneration Mother    Stroke Father    Hypertension Father    Lung cancer Father    Heart attack Paternal Uncle    Heart attack Maternal Uncle    Brain cancer Brother    Colon cancer Neg Hx    Stomach cancer Neg Hx    Esophageal cancer Neg Hx     Objective: Office vital signs reviewed. BP 101/63    Pulse 84    Temp (!) 97.5 F (36.4 C)    Ht 5\' 5"  (1.651 m)    Wt 139 lb 9.6 oz (63.3 kg)    SpO2 98%    BMI 23.23 kg/m   Physical Examination:  General: Awake, alert, well nourished, No acute distress HEENT: Normal, sclera white, MMM. no goiter.  No exophthalmos Cardio: regular rate and rhythm, S1S2 heard, no murmurs appreciated Pulm: clear to auscultation bilaterally, no wheezes, rhonchi or rales; normal work of breathing on room air Skin: dry; intact; no rashes or lesions; normal temperature Neuro: No tremor  Depression screen [facility] 2/9 05/31/2020 03/09/2020 11/18/2019  Decreased Interest 0 0 0  Down, Depressed, Hopeless 0 0 0  PHQ - 2 Score 0 0 0    Assessment/ Plan: 69 y.o. female   1. Hypothyroidism (acquired) TSH remains not at goal but is improved from previous checkups.  I am reducing her levothyroxine further to 50 mcg daily.  Hopefully this will get her levels within a steady state.  Likely she is not having any hyper or hypothyroid symptoms.  She can come back for lab appointment only in 2 to 3 months.  She may follow-up with me in 6 months, sooner if needed - levothyroxine (SYNTHROID) 50 MCG tablet; Take 1 tablet (50 mcg total) by mouth daily.  Dispense: 90 tablet; Refill: 3 - Thyroid Panel With TSH; Future  2. Need for vaccination against Streptococcus pneumoniae Administered during today's visit. - Pneumococcal polysaccharide vaccine 23-valent greater than or equal to 2yo subcutaneous/IM   No orders of the defined types were placed in this encounter.  No orders of the defined types were  placed in this encounter.  Labs reviewed with the patient.  No other changes made today.  She will be due for colonoscopy next year.  [name] Norlander, DO [name] [phone]

## 2020-05-31 NOTE — Patient Instructions (Signed)
Come in at your convenience in about 3 months for repeat thyroid labs. No appt necessary.  The order has been placed.  Dose reduced to 66mcg daily.  Hopefully, this will get you to a steady state.

## 2020-06-11 ENCOUNTER — Other Ambulatory Visit: Payer: Self-pay | Admitting: Family Medicine

## 2020-06-14 ENCOUNTER — Other Ambulatory Visit: Payer: Self-pay

## 2020-06-14 ENCOUNTER — Encounter: Payer: Self-pay | Admitting: Physician Assistant

## 2020-06-14 ENCOUNTER — Ambulatory Visit (INDEPENDENT_AMBULATORY_CARE_PROVIDER_SITE_OTHER): Payer: Medicare PPO | Admitting: Physician Assistant

## 2020-06-14 DIAGNOSIS — L719 Rosacea, unspecified: Secondary | ICD-10-CM

## 2020-06-14 DIAGNOSIS — L720 Epidermal cyst: Secondary | ICD-10-CM | POA: Diagnosis not present

## 2020-06-14 DIAGNOSIS — L820 Inflamed seborrheic keratosis: Secondary | ICD-10-CM

## 2020-06-14 NOTE — Progress Notes (Signed)
   Follow up Visit  Subjective  [name] [name] is a 69 y.o. female who presents for the following: Skin Problem (mass near left eye at end of eyebrow--bumps on forehead and near both eyes). Bump in left eyebrow. Not sore and doesn't itch. Rough bumps on right upper forehead and left temple. She has scratched one off before.   Objective  Well appearing patient in no apparent distress; mood and affect are within normal limits.  A focused examination was performed including face, back. Relevant physical exam findings are noted in the Assessment and Plan. No suspicious moles noted on back.                Objective  Left Temple, Right Temporal Scalp: Erythematous stuck-on, waxy papule or plaque.   Objective  Head - Anterior (Face): Centrifacial erythema with or without papules/pustules.   Assessment & Plan  Inflamed seborrheic keratosis (2) Left Temple; Right Temporal Scalp  Destruction of lesion - Left Temple, Right Temporal Scalp Complexity: simple   Destruction method: cryotherapy   Informed consent: discussed and consent obtained   Timeout:  patient name, date of birth, surgical site, and procedure verified Lesion destroyed using liquid nitrogen: Yes   Outcome: patient tolerated procedure well with no complications    [place] (2) Left Temple; Right Temple  OTC retinol.   Rosacea Head - Anterior (Face)  Samples of zilxi foam to use qhs

## 2020-06-22 DIAGNOSIS — E785 Hyperlipidemia, unspecified: Secondary | ICD-10-CM | POA: Diagnosis not present

## 2020-06-22 DIAGNOSIS — J309 Allergic rhinitis, unspecified: Secondary | ICD-10-CM | POA: Diagnosis not present

## 2020-06-22 DIAGNOSIS — E039 Hypothyroidism, unspecified: Secondary | ICD-10-CM | POA: Diagnosis not present

## 2020-06-22 DIAGNOSIS — K219 Gastro-esophageal reflux disease without esophagitis: Secondary | ICD-10-CM | POA: Diagnosis not present

## 2020-06-22 DIAGNOSIS — Z823 Family history of stroke: Secondary | ICD-10-CM | POA: Diagnosis not present

## 2020-06-22 DIAGNOSIS — F419 Anxiety disorder, unspecified: Secondary | ICD-10-CM | POA: Diagnosis not present

## 2020-06-22 DIAGNOSIS — R32 Unspecified urinary incontinence: Secondary | ICD-10-CM | POA: Diagnosis not present

## 2020-06-22 DIAGNOSIS — Z809 Family history of malignant neoplasm, unspecified: Secondary | ICD-10-CM | POA: Diagnosis not present

## 2020-06-22 DIAGNOSIS — M8580 Other specified disorders of bone density and structure, unspecified site: Secondary | ICD-10-CM | POA: Diagnosis not present

## 2020-06-27 ENCOUNTER — Ambulatory Visit: Payer: Medicare PPO

## 2020-06-27 ENCOUNTER — Other Ambulatory Visit: Payer: Self-pay

## 2020-06-27 DIAGNOSIS — Z23 Encounter for immunization: Secondary | ICD-10-CM

## 2020-06-27 NOTE — Progress Notes (Signed)
   Covid-19 Vaccination Clinic  Name:  [name] [name]    MRN: [id number] DOB: Sep 30, 1951  06/27/2020  [name] was observed post Covid-19 immunization for 15 minutes without incident. She was provided with Vaccine Information Sheet and instruction to access the V-Safe system.   [name] was instructed to call 911 with any severe reactions post vaccine: Marland Kitchen Difficulty breathing  . Swelling of face and throat  . A fast heartbeat  . A bad rash all over body  . Dizziness and weakness   Immunizations Administered    Name Date Dose VIS Date Route   Pfizer COVID-19 Vaccine 06/27/2020 11:34 AM 0.3 mL 12/07/2018 Intramuscular   Manufacturer: [place]   Lot: [id number]   [place]: [id number]

## 2020-07-18 ENCOUNTER — Ambulatory Visit (INDEPENDENT_AMBULATORY_CARE_PROVIDER_SITE_OTHER): Payer: Medicare Other

## 2020-07-18 ENCOUNTER — Other Ambulatory Visit: Payer: Self-pay

## 2020-07-18 DIAGNOSIS — Z23 Encounter for immunization: Secondary | ICD-10-CM

## 2020-07-18 NOTE — Progress Notes (Signed)
   Covid-19 Vaccination Clinic  Name:  [name] [name]    MRN: [id number] DOB: 1951/04/01  07/18/2020  [name] was observed post Covid-19 immunization for 15 minutes without incident. She was provided with Vaccine Information Sheet and instruction to access the V-Safe system.   [name] was instructed to call 911 with any severe reactions post vaccine: Marland Kitchen Difficulty breathing  . Swelling of face and throat  . A fast heartbeat  . A bad rash all over body  . Dizziness and weakness   Immunizations Administered    Name Date Dose VIS Date Route   Pfizer COVID-19 Vaccine 07/18/2020  1:57 PM 0.3 mL 12/07/2018 Intramuscular   Manufacturer: [place]   Lot: [id number]   [place]: [id number]

## 2020-08-21 ENCOUNTER — Other Ambulatory Visit: Payer: Self-pay | Admitting: *Deleted

## 2020-08-21 DIAGNOSIS — F339 Major depressive disorder, recurrent, unspecified: Secondary | ICD-10-CM

## 2020-08-21 DIAGNOSIS — E7841 Elevated Lipoprotein(a): Secondary | ICD-10-CM

## 2020-08-21 MED ORDER — CETIRIZINE HCL 10 MG PO TABS
10.0000 mg | ORAL_TABLET | Freq: Every day | ORAL | 1 refills | Status: DC
Start: 2020-08-21 — End: 2020-11-14

## 2020-08-21 MED ORDER — ESCITALOPRAM OXALATE 10 MG PO TABS: 10.0000 mg | ORAL_TABLET | Freq: Every day | ORAL | 1 refills | Status: DC

## 2020-08-21 MED ORDER — ATORVASTATIN CALCIUM 40 MG PO TABS: 40.0000 mg | ORAL_TABLET | Freq: Every day | ORAL | 1 refills | Status: DC

## 2020-08-27 ENCOUNTER — Other Ambulatory Visit: Payer: Medicare PPO

## 2020-08-27 ENCOUNTER — Other Ambulatory Visit: Payer: Self-pay

## 2020-08-27 DIAGNOSIS — E039 Hypothyroidism, unspecified: Secondary | ICD-10-CM | POA: Diagnosis not present

## 2020-08-28 ENCOUNTER — Other Ambulatory Visit: Payer: Self-pay | Admitting: Family Medicine

## 2020-08-28 LAB — THYROID PANEL WITH TSH
Free Thyroxine Index: 2.1 (ref 1.2–4.9)
T3 Uptake Ratio: 26 % (ref 24–39)
T4, Total: 8 ug/dL (ref 4.5–12.0)
TSH: 13.6 u[IU]/mL — ABNORMAL HIGH (ref 0.450–4.500)

## 2020-08-28 MED ORDER — LEVOTHYROXINE SODIUM 75 MCG PO TABS: 75.0000 ug | ORAL_TABLET | Freq: Every day | ORAL | 3 refills | Status: DC

## 2020-08-30 ENCOUNTER — Encounter (INDEPENDENT_AMBULATORY_CARE_PROVIDER_SITE_OTHER): Payer: Medicare Other | Admitting: Ophthalmology

## 2020-08-30 ENCOUNTER — Other Ambulatory Visit: Payer: Self-pay

## 2020-08-30 DIAGNOSIS — H43813 Vitreous degeneration, bilateral: Secondary | ICD-10-CM

## 2020-08-30 DIAGNOSIS — H33303 Unspecified retinal break, bilateral: Secondary | ICD-10-CM | POA: Diagnosis not present

## 2020-08-30 DIAGNOSIS — H35372 Puckering of macula, left eye: Secondary | ICD-10-CM

## 2020-09-10 ENCOUNTER — Ambulatory Visit: Payer: Medicare PPO | Admitting: Family Medicine

## 2020-09-12 ENCOUNTER — Ambulatory Visit: Payer: Medicare PPO

## 2020-11-14 ENCOUNTER — Other Ambulatory Visit: Payer: Self-pay | Admitting: *Deleted

## 2020-11-14 DIAGNOSIS — F339 Major depressive disorder, recurrent, unspecified: Secondary | ICD-10-CM

## 2020-11-14 DIAGNOSIS — E7841 Elevated Lipoprotein(a): Secondary | ICD-10-CM

## 2020-11-14 MED ORDER — CETIRIZINE HCL 10 MG PO TABS
10.0000 mg | ORAL_TABLET | Freq: Every day | ORAL | 1 refills | Status: DC
Start: 2020-11-14 — End: 2022-05-22

## 2020-11-14 MED ORDER — ATORVASTATIN CALCIUM 40 MG PO TABS: 40.0000 mg | ORAL_TABLET | Freq: Every day | ORAL | 0 refills | Status: DC

## 2020-11-14 MED ORDER — ESCITALOPRAM OXALATE 10 MG PO TABS: 10.0000 mg | ORAL_TABLET | Freq: Every day | ORAL | 0 refills | Status: DC

## 2020-11-15 ENCOUNTER — Encounter: Payer: Self-pay | Admitting: Family Medicine

## 2020-11-15 ENCOUNTER — Other Ambulatory Visit: Payer: Self-pay

## 2020-11-15 ENCOUNTER — Ambulatory Visit: Payer: Medicare PPO | Admitting: Family Medicine

## 2020-11-15 VITALS — BP 100/65 | HR 86 | Temp 97.8°F | Ht 65.0 in | Wt 138.0 lb

## 2020-11-15 DIAGNOSIS — N904 Leukoplakia of vulva: Secondary | ICD-10-CM | POA: Diagnosis not present

## 2020-11-15 DIAGNOSIS — F411 Generalized anxiety disorder: Secondary | ICD-10-CM | POA: Diagnosis not present

## 2020-11-15 DIAGNOSIS — H938X2 Other specified disorders of left ear: Secondary | ICD-10-CM

## 2020-11-15 DIAGNOSIS — F339 Major depressive disorder, recurrent, unspecified: Secondary | ICD-10-CM | POA: Diagnosis not present

## 2020-11-15 DIAGNOSIS — E039 Hypothyroidism, unspecified: Secondary | ICD-10-CM

## 2020-11-15 DIAGNOSIS — E559 Vitamin D deficiency, unspecified: Secondary | ICD-10-CM

## 2020-11-15 DIAGNOSIS — E7841 Elevated Lipoprotein(a): Secondary | ICD-10-CM | POA: Diagnosis not present

## 2020-11-15 MED ORDER — ATORVASTATIN CALCIUM 40 MG PO TABS: 40.0000 mg | ORAL_TABLET | Freq: Every day | ORAL | 3 refills | Status: DC

## 2020-11-15 MED ORDER — ESCITALOPRAM OXALATE 10 MG PO TABS: 10.0000 mg | ORAL_TABLET | Freq: Every day | ORAL | 3 refills | Status: DC

## 2020-11-15 MED ORDER — CLOBETASOL PROPIONATE 0.05 % EX OINT: TOPICAL_OINTMENT | CUTANEOUS | 99 refills | Status: DC

## 2020-11-15 NOTE — Patient Instructions (Signed)
Vitamin D was normal in August 2021. Level 53. Continue taking your vitamin D supplement as you have been.

## 2020-11-15 NOTE — Progress Notes (Signed)
Subjective: CC: Follow-up hypothyroidism PCP: [name] Norlander, DO YCX:KGYJEH [name] is a 70 y.o. female presenting to clinic today for:  1.  Hypothyroidism Patient reports compliance with her Synthroid 75 mcg daily.  She takes on a totally empty stomach and separate by any food or other medications by least 1 hour.  She denies any change in voice, difficulty swallowing, tremor, heart palpitations, change in weight or bowel habits.  No change in energy.  No use of biotin-containing products  2.  Hyperlipidemia Patient is compliant with Lipitor 40 mg daily.  No chest pain, shortness of breath or abdominal pain  3.  Lichen sclerosis of the vagina Patient was previously followed by OB/GYN for this but she has apparently left that practice.  She was monitoring the patient for the above and has had a biopsy performed in the past which was negative for any squamous cell carcinomas.  It was consistent with a lichen sclerosis of the vagina and she continues to use clobetasol ointment as needed flares but this is not on a daily or regular basis.  4.  Osteopenia/vitamin D deficiency Patient with known osteopenia and vitamin D deficiency.  She is on a daily vitamin D supplement of 5000 mg daily and is also on calcium.  She wanted to make sure that she is not taking too much sugar that this could cause complications.  Her last vitamin D level was normal in August.  5.  Left ear fullness Patient complains of left ear fullness and difficulty with left nare draining.  Her right nare drains without difficulty and she has no problems in the right ear.  She wonders if she has an earwax buildup.  Denies any difficulty breathing through her nose.  ROS: Per HPI  No Known Allergies Past Medical History:  Diagnosis Date  . Allergy   . Anxiety   . BCC (basal cell carcinoma of skin) 07/31/2016   Superficial Bcc left shoulder  . History of cold sores   . Hyperlipidemia   . Osteopenia   . Rosacea   .  Thyroid disease    hypothyroid    Current Outpatient Medications:  .  atorvastatin (LIPITOR) 40 MG tablet, Take 1 tablet (40 mg total) by mouth daily., Disp: 90 tablet, Rfl: 0 .  Calcium Carbonate-Vitamin D (CALCIUM 500/D PO), Take by mouth., Disp: , Rfl:  .  cetirizine (ZYRTEC) 10 MG tablet, Take 1 tablet (10 mg total) by mouth daily., Disp: 90 tablet, Rfl: 1 .  Cholecalciferol (VITAMIN D3) 2000 UNITS TABS, Take 1 tablet by mouth daily.  , Disp: , Rfl:  .  clobetasol ointment (TEMOVATE) 0.05 %, , Disp: , Rfl:  .  escitalopram (LEXAPRO) 10 MG tablet, Take 1 tablet (10 mg total) by mouth daily., Disp: 90 tablet, Rfl: 0 .  famotidine (PEPCID) 20 MG tablet, Take 1 tablet (20 mg total) by mouth 2 (two) times daily., Disp:  , Rfl:  .  levothyroxine (SYNTHROID) 75 MCG tablet, Take 1 tablet (75 mcg total) by mouth daily., Disp: 90 tablet, Rfl: 3 Social History   Socioeconomic History  . Marital status: Married    Spouse name: Timmothy Sours   . Number of children: 1  . Years of education: 30  . Highest education level: High school graduate  Occupational History  . Occupation: Conservation officer, historic buildings: [employer]  Tobacco Use  . Smoking status: Never Smoker  . Smokeless tobacco: Never Used  Vaping Use  . Vaping Use: Never  used  Substance and Sexual Activity  . Alcohol use: No  . Drug use: No  . Sexual activity: Yes    Partners: Male  Other Topics Concern  . Not on file  Social History Narrative   Patient is married.  Her husband recently retired last March and they have been trying to travel more.   She has a daughter who lives locally.  She has grandchildren.  She is active in her church and tries to stay physically active.   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 Glaucoma Mother   . Cerebral aneurysm Mother   . Macular degeneration Mother   . Stroke Father   . Hypertension Father   . Lung cancer Father   . Heart attack Paternal Uncle   . Heart attack Maternal Uncle   . Brain cancer Brother   . Colon cancer Neg Hx   . Stomach cancer Neg Hx   . Esophageal cancer Neg Hx     Objective: Office vital signs reviewed. BP 100/65   Pulse 86   Temp 97.8 F (36.6 C) (Temporal)   Ht _0  (1.651 m)   Wt 138 lb (62.6 kg)   SpO2 96%   BMI 22.96 kg/m   Physical Examination:  General: Awake, alert, well nourished, No acute distress HEENT: Normal; sclera white.  No exophthalmos.  No carotid bruits.  Septal deviation noted to the left.  She has a narrow left external auditory canal.  No significant cerumen buildup appreciated.  TMs are visualized bilaterally Cardio: regular rate and rhythm, S1S2 heard, no murmurs appreciated Pulm: clear to auscultation bilaterally, no wheezes, rhonchi or rales; normal work of breathing on room air Extremities: warm, well perfused, No edema, cyanosis or clubbing; +2 pulses bilaterally MSK: normal gait and station Skin: dry; intact; no rashes or lesions; normal temperature Neuro: No tremor  Assessment/ Plan: 70 y.o. female   Hypothyroidism (acquired) - Plan: Thyroid Panel With TSH, CMP14+EGFR  Generalized anxiety disorder - Plan: escitalopram (LEXAPRO) 10 MG tablet  Vitamin D deficiency  Elevated lipoprotein(a) - Plan: atorvastatin (LIPITOR) 40 MG tablet  Depression, recurrent (HCC) - Plan: escitalopram (LEXAPRO) 10 MG tablet  Lichen sclerosus of female genitalia - Plan: clobetasol ointment (TEMOVATE) 0.05 %  Sensation of fullness in ear, left  She is euthyroid from a  symptomology standpoint.  Check thyroid panel, CMP  Anxiety disorder and depression are stable.  Continue Lexapro  Vitamin D is normal and does not require repeat today.  Okay to continue current regimen.  Plan for repeat in August with her full physical exam and fasting labs  Continue Lipitor.  Plan for lipid panel at next visit  I am glad to take over her clobetasol ointment for lichen sclerosus.  She has had a previous negative biopsy we discussed that we can do yearly pelvic exams to ensure that no other changes arise.  We will plan to perform this at her next visit and in the  interim clobetasol has been renewed  I suspect that her left ear fullness is secondary to septal deviation.  She also has a very narrow external auditory canal on that side.  Continue gentle massage promote eustachian tube drainage, antihistamines.  Follow-up as needed  No orders of the defined types were placed in this encounter.  No orders of the defined types were placed in this encounter.  Declines influenza and COVID vaccinations today.  Her husband had a complication from [place] vaccination which apparently caused multiple emboli.   [name] Norlander, DO [place] [phone]

## 2020-11-16 LAB — CMP14+EGFR
ALT: 28 IU/L (ref 0–32)
AST: 22 IU/L (ref 0–40)
Albumin/Globulin Ratio: 1.6 (ref 1.2–2.2)
Albumin: 4 g/dL (ref 3.8–4.8)
Alkaline Phosphatase: 98 IU/L (ref 44–121)
BUN/Creatinine Ratio: 15 (ref 12–28)
BUN: 15 mg/dL (ref 8–27)
Bilirubin Total: 0.7 mg/dL (ref 0.0–1.2)
CO2: 24 mmol/L (ref 20–29)
Calcium: 9.4 mg/dL (ref 8.7–10.3)
Chloride: 102 mmol/L (ref 96–106)
Creatinine, Ser: 0.99 mg/dL (ref 0.57–1.00)
GFR calc Af Amer: 68 mL/min/{1.73_m2} (ref >59)
GFR calc non Af Amer: 59 mL/min/{1.73_m2} — ABNORMAL LOW (ref >59)
Globulin, Total: 2.5 g/dL (ref 1.5–4.5)
Glucose: 92 mg/dL (ref 65–99)
Potassium: 4.2 mmol/L (ref 3.5–5.2)
Sodium: 139 mmol/L (ref 134–144)
Total Protein: 6.5 g/dL (ref 6.0–8.5)

## 2020-11-16 LAB — THYROID PANEL WITH TSH
Free Thyroxine Index: 3.5 (ref 1.2–4.9)
T3 Uptake Ratio: 31 % (ref 24–39)
T4, Total: 11.2 ug/dL (ref 4.5–12.0)
TSH: 0.62 u[IU]/mL (ref 0.450–4.500)

## 2021-01-14 ENCOUNTER — Encounter: Payer: Self-pay | Admitting: Nurse Practitioner

## 2021-01-14 ENCOUNTER — Ambulatory Visit: Payer: Medicare PPO | Admitting: Nurse Practitioner

## 2021-01-14 DIAGNOSIS — J0110 Acute frontal sinusitis, unspecified: Secondary | ICD-10-CM | POA: Diagnosis not present

## 2021-01-14 MED ORDER — FLUTICASONE PROPIONATE 50 MCG/ACT NA SUSP: 2.0000 | Freq: Every day | NASAL | 6 refills | Status: DC

## 2021-01-14 MED ORDER — BENZONATATE 100 MG PO CAPS: 100.0000 mg | ORAL_CAPSULE | Freq: Three times a day (TID) | ORAL | 0 refills | Status: DC | PRN

## 2021-01-14 MED ORDER — DM-GUAIFENESIN ER 30-600 MG PO TB12: 1.0000 | ORAL_TABLET | Freq: Two times a day (BID) | ORAL | 0 refills | Status: DC

## 2021-01-14 MED ORDER — AMOXICILLIN-POT CLAVULANATE 875-125 MG PO TABS: 1.0000 | ORAL_TABLET | Freq: Two times a day (BID) | ORAL | 0 refills | Status: DC

## 2021-01-14 NOTE — [place] Notes (Signed)
   Virtual Visit  Note Due to COVID-19 pandemic this visit was conducted virtually. This visit type was conducted due to national recommendations for restrictions regarding the COVID-19 Pandemic (e.g. social distancing, sheltering in place) in an effort to limit this patient's exposure and mitigate transmission in our community. All issues noted in this document were discussed and addressed.  A physical exam was not performed with this format.  I connected with [name] [name] on 01/14/21 at 8:30 am  by telephone and verified that I am speaking with the correct person using two identifiers. [name] [name] is currently located at home during visit. The provider, Ivy Lynn, NP is located in their office at time of visit.  I discussed the limitations, risks, security and privacy concerns of performing an evaluation and management service by telephone and the availability of in person appointments. I also discussed with the patient that there may be a patient responsible charge related to this service. The patient expressed understanding and agreed to proceed.   History and Present Illness:  Sinusitis This is a recurrent problem. The current episode started in the past 7 days. The problem has been gradually worsening since onset. There has been no fever. The pain is moderate. Associated symptoms include congestion, coughing, headaches, sinus pressure and a sore throat. Pertinent negatives include no chills, ear pain or neck pain. Treatments tried: Zyertec. The treatment provided no relief.      Review of Systems  Constitutional: Negative for chills and fever.  HENT: Positive for congestion, sinus pressure and sore throat. Negative for ear pain.   Respiratory: Positive for cough.   Cardiovascular: Negative.   Musculoskeletal: Negative for neck pain.  Skin: Negative.   Neurological: Positive for headaches.  All other systems reviewed and are negative.    Observations/Objective: Telephone  visit-patient did not sound to be in distress.  Assessment and Plan:  Subacute frontal sinusitis Worsening sinus congestion and head pressure in the past 7 days. Patient is reporting, cough, scratchy throat, and chest congestion. Started patient on Augmentin, Flonase, Muccinex, and benzonate ,incrase hydration, and use tylenol for headache.   Follow up with worsening or unresolved symptoms, Rx sent to pharmacy    Follow Up Instructions: Follow-up with worsening unresolved symptoms.    I discussed the assessment and treatment plan with the patient. The patient was provided an opportunity to ask questions and all were answered. The patient agreed with the plan and demonstrated an understanding of the instructions.   The patient was advised to call back or seek an in-person evaluation if the symptoms worsen or if the condition fails to improve as anticipated.  The above assessment and management plan was discussed with the patient. The patient verbalized understanding of and has agreed to the management plan. Patient is aware to call the clinic if symptoms persist or worsen. Patient is aware when to return to the clinic for a follow-up visit. Patient educated on when it is appropriate to go to the emergency department.   Time call ended: 8:49 am  I provided 9 minutes of non  face-to-face time during this encounter.    Ivy Lynn, NP

## 2021-01-14 NOTE — Assessment & Plan Note (Signed)
Worsening sinus congestion and head pressure in the past 7 days. Patient is reporting, cough, scratchy throat, and chest congestion. Started patient on Augmentin, Flonase, Muccinex, and benzonate ,incrase hydration, and use tylenol for headache.   Follow up with worsening or unresolved symptoms, Rx sent to pharmacy

## 2021-01-16 ENCOUNTER — Other Ambulatory Visit: Payer: Self-pay | Admitting: Family Medicine

## 2021-01-16 DIAGNOSIS — E7841 Elevated Lipoprotein(a): Secondary | ICD-10-CM

## 2021-01-16 DIAGNOSIS — F411 Generalized anxiety disorder: Secondary | ICD-10-CM

## 2021-01-16 DIAGNOSIS — F339 Major depressive disorder, recurrent, unspecified: Secondary | ICD-10-CM

## 2021-01-17 ENCOUNTER — Ambulatory Visit (INDEPENDENT_AMBULATORY_CARE_PROVIDER_SITE_OTHER): Payer: Medicare PPO | Admitting: Family Medicine

## 2021-01-17 ENCOUNTER — Encounter: Payer: Self-pay | Admitting: Family Medicine

## 2021-01-17 VITALS — BP 108/71 | HR 77 | Temp 98.0°F

## 2021-01-17 DIAGNOSIS — J988 Other specified respiratory disorders: Secondary | ICD-10-CM | POA: Diagnosis not present

## 2021-01-17 DIAGNOSIS — J302 Other seasonal allergic rhinitis: Secondary | ICD-10-CM

## 2021-01-17 MED ORDER — MONTELUKAST SODIUM 4 MG PO CHEW: 4.0000 mg | CHEWABLE_TABLET | Freq: Every day | ORAL | 0 refills | Status: DC

## 2021-01-17 MED ORDER — METHYLPREDNISOLONE ACETATE 80 MG/ML IJ SUSP
80.0000 mg | Freq: Once | INTRAMUSCULAR | Status: AC
  Administered 2021-01-17: 80 mg via INTRAMUSCULAR

## 2021-01-17 NOTE — Patient Instructions (Signed)
Montelukast Chewable Tablets What is this medicine? MONTELUKAST (mon te LOO kast) is used to prevent and treat the symptoms of asthma. It is also used to treat allergies. Do not use for an acute asthma attack. This medicine may be used for other purposes; ask your health care provider or pharmacist if you have questions. COMMON BRAND NAME(S): Singulair What should I tell my health care provider before I take this medicine? They need to know if you have any of these conditions:  liver disease  phenylketonuria  an unusual or allergic reaction to montelukast, other medicines, foods, dyes, or preservatives  pregnant or trying to get pregnant  breast-feeding How should I use this medicine? Take this medicine by mouth with a glass of water. Chew it completely before swallowing. Follow the directions on the prescription label. If you have asthma, take this medicine once a day in the evening. If you have allergies, take this medicine once a day, at about the same time each day. You may take this medicine with or without food. Take your medicine at regular intervals. Do not take it more often than directed. Do not stop taking except on your doctor's advice. Talk to your pediatrician regarding the use of this medicine in children. While this drug may be prescribed for children as young as 15 years of age, precautions do apply. Overdosage: If you think you have taken too much of this medicine contact a poison control center or emergency room at once. NOTE: This medicine is only for you. Do not share this medicine with others. What if I miss a dose? If you miss a dose, skip it. Take your next dose at the normal time. Do not take extra or 2 doses at the same time to make up for the missed dose. What may interact with this medicine?  anti-infectives like rifampin and rifabutin  medicines for seizures like phenytoin, phenobarbital, and carbamazepine This list may not describe all possible interactions.  Give your health care provider a list of all the medicines, herbs, non-prescription drugs, or dietary supplements you use. Also tell them if you smoke, drink alcohol, or use illegal drugs. Some items may interact with your medicine. What should I watch for while using this medicine? Visit your doctor or health care professional for regular checks on your progress. Tell your doctor or health care professional if your allergy or asthma symptoms do not improve. Take your medicine even when you do not have symptoms. Do not stop taking any of your medicine(s) unless your doctor tells you to. If you have asthma, talk to your doctor about what to do in an acute asthma attack. Always have your inhaled rescue medicine for asthma attacks with you. Patients and their families should watch for new or worsening thoughts of suicide or depression. Also watch for sudden changes in feelings such as feeling anxious, agitated, panicky, irritable, hostile, aggressive, impulsive, severely restless, overly excited and hyperactive, or not being able to sleep. Any worsening of mood or thoughts of suicide or dying should be reported to your health care professional right away. What side effects may I notice from receiving this medicine? Side effects that you should report to your doctor or health care professional as soon as possible:  allergic reactions like skin rash or hives, or swelling of the face, lips, or tongue  breathing problems  changes in emotions or moods  confusion  depressed mood  fever or infection  hallucinations  joint pain  painful lumps under the  skin  pain, tingling, numbness in the hands or feet  redness, blistering, peeling, or loosening of the skin, including inside the mouth  restlessness  seizures  sleep walking  signs and symptoms of infection like fever; chills; cough; sore throat; flu-like illness  signs and symptoms of liver injury like dark yellow or brown urine; general  ill feeling or flu-like symptoms; light-colored stools; loss of appetite; nausea; right upper belly pain; unusually weak or tired; yellowing of the eyes or skin  sinus pain or swelling  stuttering  suicidal thoughts or other mood changes  tremors  trouble sleeping  uncontrolled muscle movements  unusual bleeding or bruising  vivid or bad dreams Side effects that usually do not require medical attention (report to your doctor or health care professional if they continue or are bothersome):  dizziness  drowsiness  headache  runny nose  stomach upset  tiredness This list may not describe all possible side effects. Call your doctor for medical advice about side effects. You may report side effects to FDA at 1-800-FDA-1088. Where should I keep my medicine? Keep out of the reach of children. Store at a room temperature between 15 and 30 degrees C (59 and 86 degrees F). Protect from light and moisture. Keep this medicine in the original bottle. Throw away any unused medicine after the expiration date. NOTE: This sheet is a summary. It may not cover all possible information. If you have questions about this medicine, talk to your doctor, pharmacist, or health care provider.  2021 Elsevier/Gold Standard (2020-08-25 15:20:38)

## 2021-01-17 NOTE — Progress Notes (Signed)
Acute Office Visit  Subjective:    Patient ID: [name] [name], female    DOB: 04/27/1951, 70 y.o.   MRN: [id number]  Chief Complaint  Patient presents with  . Cough    Congestion, nasal drainage, headache    HPI Patient is in today for congestion, nasal drainage, cough, and headache x 1 week. She had a visit on 01/14/21 and was given Augmentin, flonase, mucinex, and tessalon perles. She also takes zyrtec daily. Denies fever, chest pain, wheezing, or shortness of breath. She feels like her symptoms have days that are better or worse. She has a history of seasonal allergies that are worse in the spring with the pollen.   Past Medical History:  Diagnosis Date  . Allergy   . Anxiety   . BCC (basal cell carcinoma of skin) 07/31/2016   Superficial Bcc left shoulder  . History of cold sores   . Hyperlipidemia   . Osteopenia   . Rosacea   . Thyroid disease    hypothyroid    Past Surgical History:  Procedure Laterality Date  . skin cancer removal    . TUBAL LIGATION    . WISDOM TOOTH EXTRACTION      Family History  Problem Relation Age of Onset  . Glaucoma Mother   . Cerebral aneurysm Mother   . Macular degeneration Mother   . Stroke Father   . Hypertension Father   . Lung cancer Father   . Heart attack Paternal Uncle   . Heart attack Maternal Uncle   . Brain cancer Brother   . Colon cancer Neg Hx   . Stomach cancer Neg Hx   . Esophageal cancer Neg Hx     Social History   Socioeconomic History  . Marital status: Married    Spouse name: Timmothy Sours   . Number of children: 1  . Years of education: 79  . Highest education level: High school graduate  Occupational History  . Occupation: Conservation officer, historic buildings: [employer]  Tobacco Use  . Smoking status: Never Smoker  . Smokeless tobacco: Never Used  Vaping Use  . Vaping Use: Never used  Substance and Sexual Activity  . Alcohol use: No  . Drug use: No  . Sexual activity: Yes    Partners: Male  Other  Topics Concern  . Not on file  Social History Narrative   Patient is married.  Her husband recently retired last March and they have been trying to travel more.   She has a daughter who lives locally.  She has grandchildren.  She is active in her church and tries to stay physically active.   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  She has been retired for a couple of years and is enjoying retirement quite a IT trainer: Not on Art therapist Insecurity: Not on file  Transportation Needs: Not on file  Physical Activity: Not on file  Stress: Not on file  Social Connections: Not on file  Intimate Partner Violence: Not on file    Outpatient Medications Prior to Visit  Medication Sig Dispense Refill  . amoxicillin-clavulanate (AUGMENTIN) 875-125 MG tablet Take 1 tablet by mouth 2 (two) times daily. 14 tablet 0  . atorvastatin (LIPITOR) 40 MG tablet TAKE 1 TABLET EVERY DAY 90 tablet 2  . benzonatate (TESSALON PERLES) 100 MG capsule Take 1 capsule (  100 mg total) by mouth 3 (three) times daily as needed for cough. 20 capsule 0  . Calcium Carbonate-Vitamin D (CALCIUM 500/D PO) Take by mouth.    . cetirizine (ZYRTEC) 10 MG tablet Take 1 tablet (10 mg total) by mouth daily. 90 tablet 1  . Cholecalciferol (VITAMIN D3) 2000 UNITS TABS Take 1 tablet by mouth daily.    . clobetasol ointment (TEMOVATE) 0.05 % Apply to affected areas on external vagina nightly as needed for lichen sclerosis 45 g prn  . dextromethorphan-guaiFENesin (MUCINEX DM) 30-600 MG 12hr tablet Take 1 tablet by mouth 2 (two) times daily. 30 tablet 0  . escitalopram (LEXAPRO) 10 MG tablet TAKE 1 TABLET EVERY DAY 90 tablet 2  . famotidine (PEPCID) 20 MG tablet Take 1 tablet (20 mg total) by mouth 2 (two) times daily.    . fluticasone (FLONASE) 50 MCG/ACT nasal spray Place 2 sprays into both nostrils daily. 16 g 6  . levothyroxine  (SYNTHROID) 75 MCG tablet Take 1 tablet (75 mcg total) by mouth daily. 90 tablet 3   No facility-administered medications prior to visit.    No Known Allergies  Review of Systems As per HPI.     Objective:    Physical Exam Vitals and nursing note reviewed.  Constitutional:      General: She is not in acute distress.    Appearance: Normal appearance. She is not toxic-appearing or diaphoretic.  HENT:     Head: Normocephalic and atraumatic.     Nose: Congestion present.  Eyes:     General:        Right eye: Discharge (clear) present.        Left eye: Discharge (clear) present.    Pupils: Pupils are equal, round, and reactive to light.  Cardiovascular:     Rate and Rhythm: Normal rate and regular rhythm.     Heart sounds: Normal heart sounds. No murmur heard.   Pulmonary:     Effort: Pulmonary effort is normal. No respiratory distress.     Breath sounds: No stridor. Rhonchi (expiratory, clears with coughing) present. No wheezing or rales.  Chest:     Chest wall: No tenderness.  Musculoskeletal:     Right lower leg: No edema.     Left lower leg: No edema.  Skin:    General: Skin is warm and dry.  Neurological:     General: No focal deficit present.     Mental Status: She is alert and oriented to person, place, and time.     Motor: No weakness.     Gait: Gait normal.  Psychiatric:        Mood and Affect: Mood normal.        Behavior: Behavior normal.     BP 108/71   Pulse 77   Temp 98 F (36.7 C) (Temporal)   SpO2 97% Comment: room air Wt Readings from Last 3 Encounters:  11/15/20 138 lb (62.6 kg)  05/31/20 139 lb 9.6 oz (63.3 kg)  03/09/20 136 lb 9.6 oz (62 kg)    Health Maintenance Due  Topic Date Due  . COVID-19 Vaccine (3 - Pfizer risk 4-dose series) 08/15/2020    There are no preventive care reminders to display for this patient.   Lab Results  Component Value Date   TSH 0.620 11/15/2020   Lab Results  Component Value Date   WBC 5.9  11/18/2019   HGB 13.1 11/18/2019   HCT 39.8 11/18/2019   MCV 86 11/18/2019  PLT 287 11/18/2019   Lab Results  Component Value Date   NA 139 11/15/2020   K 4.2 11/15/2020   CO2 24 11/15/2020   GLUCOSE 92 11/15/2020   BUN 15 11/15/2020   CREATININE 0.99 11/15/2020   BILITOT 0.7 11/15/2020   ALKPHOS 98 11/15/2020   AST 22 11/15/2020   ALT 28 11/15/2020   PROT 6.5 11/15/2020   ALBUMIN 4.0 11/15/2020   CALCIUM 9.4 11/15/2020   ANIONGAP 7 08/23/2018   Lab Results  Component Value Date   CHOL 127 05/28/2020   Lab Results  Component Value Date   HDL 42 05/28/2020   Lab Results  Component Value Date   LDLCALC 61 05/28/2020   Lab Results  Component Value Date   TRIG 137 05/28/2020   Lab Results  Component Value Date   CHOLHDL 3.0 05/28/2020   No results found for: HGBA1C     Assessment & Plan:   Cythina was seen today for cough.  Diagnoses and all orders for this visit:  Respiratory infection Currently on Augmentin. Continue mucinex. Stay well hydrated, rest. IM steroid injection today.         -     methylPREDNISolone acetate (DEPO-MEDROL)              injection 80 mg  Seasonal allergies Add singular as below. Continue zyrtec and flonase. Steroid IM injeciton today in office.  -     montelukast (SINGULAIR) 4 MG chewable tablet; Chew 1 tablet (4 mg total) by mouth at bedtime. -     methylPREDNISolone acetate (DEPO-MEDROL) injection 80 mg   Return to office for new or worsening symptoms, or if symptoms persist.   The patient indicates understanding of these issues and agrees with the plan.  Gwenlyn Perking, FNP

## 2021-01-31 ENCOUNTER — Other Ambulatory Visit: Payer: Self-pay | Admitting: Family Medicine

## 2021-01-31 DIAGNOSIS — J302 Other seasonal allergic rhinitis: Secondary | ICD-10-CM

## 2021-02-11 DIAGNOSIS — H2513 Age-related nuclear cataract, bilateral: Secondary | ICD-10-CM | POA: Diagnosis not present

## 2021-02-11 DIAGNOSIS — H40033 Anatomical narrow angle, bilateral: Secondary | ICD-10-CM | POA: Diagnosis not present

## 2021-03-24 ENCOUNTER — Other Ambulatory Visit: Payer: Self-pay | Admitting: Family Medicine

## 2021-03-24 DIAGNOSIS — F339 Major depressive disorder, recurrent, unspecified: Secondary | ICD-10-CM

## 2021-03-24 DIAGNOSIS — F411 Generalized anxiety disorder: Secondary | ICD-10-CM

## 2021-03-24 DIAGNOSIS — E7841 Elevated Lipoprotein(a): Secondary | ICD-10-CM

## 2021-04-26 ENCOUNTER — Encounter: Payer: Self-pay | Admitting: Gastroenterology

## 2021-04-29 ENCOUNTER — Other Ambulatory Visit: Payer: Self-pay | Admitting: Family Medicine

## 2021-04-29 DIAGNOSIS — E7841 Elevated Lipoprotein(a): Secondary | ICD-10-CM

## 2021-05-09 DIAGNOSIS — Z1231 Encounter for screening mammogram for malignant neoplasm of breast: Secondary | ICD-10-CM | POA: Diagnosis not present

## 2021-05-21 ENCOUNTER — Ambulatory Visit (INDEPENDENT_AMBULATORY_CARE_PROVIDER_SITE_OTHER): Payer: Medicare PPO

## 2021-05-21 VITALS — Ht 65.0 in | Wt 137.0 lb

## 2021-05-21 DIAGNOSIS — Z0000 Encounter for general adult medical examination without abnormal findings: Secondary | ICD-10-CM | POA: Diagnosis not present

## 2021-05-21 NOTE — Progress Notes (Signed)
Subjective:   [name] [name] is a 70 y.o. female who presents for Medicare Annual (Subsequent) preventive examination.  Virtual Visit via Telephone Note  I connected with  [name] [name] on 05/21/21 at  2:00 PM EDT by telephone and verified that I am speaking with the correct person using two identifiers.  Location: Patient: Home Provider: [facility] Persons participating in the virtual visit: patient/Nurse Health Advisor   I discussed the limitations, risks, security and privacy concerns of performing an evaluation and management service by telephone and the availability of in person appointments. The patient expressed understanding and agreed to proceed.  Interactive audio and video telecommunications were attempted between this nurse and patient, however failed, due to patient having technical difficulties OR patient did not have access to video capability.  We continued and completed visit with audio only.  Some vital signs may be absent or patient reported.   Gwynne Kemnitz E Tedrick Port, LPN   Review of Systems     Cardiac Risk Factors include: advanced age (>6mn, >>6women);dyslipidemia;sedentary lifestyle     Objective:    Today's Vitals   05/21/21 1341  Weight: 137 lb (62.1 kg)  Height: '5\' 5"'$  (1.651 m)   Body mass index is 22.8 kg/m.  Advanced Directives 05/21/2021 05/16/2020 05/02/2019 08/22/2018  Does Patient Have a Medical Advance Directive? No No Yes No  Type of Advance Directive - - H[place] Chart? - - No - copy requested -  Would patient like information on creating a medical advance directive? No - Patient declined No - Patient declined No - Patient declined No - Patient declined    Current Medications (verified) Outpatient Encounter Medications as of 05/21/2021  Medication Sig   amoxicillin-clavulanate (AUGMENTIN) 875-125 MG tablet Take 1 tablet by mouth 2 (two) times daily.   atorvastatin (LIPITOR) 40 MG tablet  TAKE 1 TABLET EVERY DAY   benzonatate (TESSALON PERLES) 100 MG capsule Take 1 capsule (100 mg total) by mouth 3 (three) times daily as needed for cough.   Calcium Carbonate-Vitamin D (CALCIUM 500/D PO) Take by mouth.   cetirizine (ZYRTEC) 10 MG tablet Take 1 tablet (10 mg total) by mouth daily.   Cholecalciferol (VITAMIN D3) 2000 UNITS TABS Take 1 tablet by mouth daily.   clobetasol ointment (TEMOVATE) 0.05 % Apply to affected areas on external vagina nightly as needed for lichen sclerosis   dextromethorphan-guaiFENesin (MUCINEX DM) 30-600 MG 12hr tablet Take 1 tablet by mouth 2 (two) times daily.   escitalopram (LEXAPRO) 10 MG tablet TAKE 1 TABLET EVERY DAY   famotidine (PEPCID) 20 MG tablet Take 1 tablet (20 mg total) by mouth 2 (two) times daily.   fluticasone (FLONASE) 50 MCG/ACT nasal spray Place 2 sprays into both nostrils daily.   levothyroxine (SYNTHROID) 75 MCG tablet Take 1 tablet (75 mcg total) by mouth daily.   montelukast (SINGULAIR) 4 MG chewable tablet CHEW 1 TABLET BY MOUTH AT BEDTIME.   No facility-administered encounter medications on file as of 05/21/2021.    Allergies (verified) Patient has no known allergies.   History: Past Medical History:  Diagnosis Date   Allergy    Anxiety    BCC (basal cell carcinoma of skin) 07/31/2016   Superficial Bcc left shoulder   History of cold sores    Hyperlipidemia    Osteopenia    Rosacea    Thyroid disease    hypothyroid   Past Surgical History:  Procedure Laterality Date  skin cancer removal     TUBAL LIGATION     WISDOM TOOTH EXTRACTION     Family History  Problem Relation Age of Onset   Glaucoma Mother    Cerebral aneurysm Mother    Macular degeneration Mother    Stroke Father    Hypertension Father    Lung cancer Father    Heart attack Paternal Uncle    Heart attack Maternal Uncle    Brain cancer Brother    Colon cancer Neg Hx    Stomach cancer Neg Hx    Esophageal cancer Neg Hx    Social History    Socioeconomic History   Marital status: Married    Spouse name: Timmothy Sours    Number of children: 1   Years of education: 12   Highest education level: High school graduate  Occupational History   Occupation: Conservation officer, historic buildings: [employer]  Tobacco Use   Smoking status: Never   Smokeless tobacco: Never  Vaping Use   Vaping Use: Never used  Substance and Sexual Activity   Alcohol use: No   Drug use: No   Sexual activity: Yes    Partners: Male  Other Topics Concern   Not on file  Social History Narrative   Patient is married.  Her husband recently retired last March and they have been trying to travel more.   She has a daughter who lives locally.  She has grandchildren.  She is active in her church and tries to stay physically active.   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  She has been retired for a couple of years and is enjoying retirement quite a IT trainer: Low Risk    Difficulty of Paying Living Expenses: Not hard at all  Food Insecurity: No Food Insecurity   Worried About Charity fundraiser in the Last Year: Never true   Arboriculturist in the Last Year: Never true  Transportation Needs: No Transportation Needs   Lack of Transportation (Medical): No   Lack of Transportation (Non-Medical): No  Physical Activity: Insufficiently Active   Days of Exercise per Week: 3 days   Minutes of Exercise per Session: 30 min  Stress: No Stress Concern Present   Feeling of Stress : Not at all  Social Connections: Socially Integrated   Frequency of Communication with Friends and Family: More than three times a week   Frequency of Social Gatherings with Friends and Family: More than three times a week   Attends Religious Services: More than 4 times per year   Active Member of [employer] or Organizations: Yes   Attends Archivist Meetings: 1 to 4 times per  year   Marital Status: Married    Tobacco Counseling Counseling given: Not Answered   Clinical Intake:  Pre-visit preparation completed: Yes  Pain : No/denies pain     BMI - recorded: 22.8 Nutritional Status: BMI of 19-24  Normal Nutritional Risks: None Diabetes: No  How often do you need to have someone help you when you read instructions, pamphlets, or other written materials from your doctor or pharmacy?: 1 - Never  Diabetic? No  Interpreter Needed?: No  Information entered by :: Bernardino Dowell, LPN   Activities of Daily Living In your present state of health, do you have any difficulty performing the following activities: 05/21/2021  Hearing? N  Vision? N  Difficulty concentrating or making decisions? N  Walking or climbing stairs? N  Dressing or bathing? N  Doing errands, shopping? N  Preparing Food and eating ? N  Using the Toilet? N  In the past six months, have you accidently leaked urine? Y  Comment wears light days pads - light leakage intermittently  Do you have problems with loss of bowel control? N  Managing your Medications? N  Managing your Finances? N  Housekeeping or managing your Housekeeping? N  Some recent data might be hidden    Patient Care Team: Janora Norlander, DO as PCP - General (Family Medicine) Hayden Pedro, MD as Consulting Physician (Ophthalmology) Clark-Burning, Anderson Malta, PA-C (Inactive) (Dermatology)  Indicate any recent Medical Services you may have received from other than [facility] providers in the past year (date may be approximate).     Assessment:   This is a routine wellness examination for [name] [name].  Hearing/Vision screen Hearing Screening - Comments:: Denies hearing difficulties  Vision Screening - Comments:: Wears eyeglasses - up to date with annual eye exams with Dr Marin Comment and Dr Zigmund Daniel  Dietary issues and exercise activities discussed: Current Exercise Habits: Home exercise routine, Type of exercise: walking, Time  (Minutes): 30, Frequency (Times/Week): 3, Weekly Exercise (Minutes/Week): 90, Intensity: Mild, Exercise limited by: None identified   Goals Addressed             This Visit's Progress    Exercise 150 min/wk Moderate Activity   Not on track    Welcome to Medicare   Not on track      Depression Screen PHQ 2/9 Scores 05/21/2021 11/15/2020 05/31/2020 03/09/2020 11/18/2019 05/02/2019 10/19/2018  PHQ - 2 Score 0 0 0 0 0 0 0  PHQ- 9 Score - 0 - - - - -    Fall Risk Fall Risk  05/21/2021 11/15/2020 05/31/2020 05/16/2020 03/09/2020  Falls in the past year? 0 0 0 0 0  Number falls in past yr: 0 - - - -  Injury with Fall? 0 - - - -  Risk for fall due to : No Fall Risks - - - -  Follow up Falls prevention discussed - - - -    FALL RISK PREVENTION PERTAINING TO THE HOME:  Any stairs in or around the home? Yes  If so, are there any without handrails? No  Home free of loose throw rugs in walkways, pet beds, electrical cords, etc? Yes  Adequate lighting in your home to reduce risk of falls? Yes   ASSISTIVE DEVICES UTILIZED TO PREVENT FALLS:  Life alert? No  Use of a cane, walker or w/c? No  Grab bars in the bathroom? No  Shower chair or bench in shower? No  Elevated toilet seat or a handicapped toilet? No   TIMED UP AND GO:  Was the test performed? No . Telephonic visit  Cognitive Function: Normal cognitive status assessed by direct observation by this Nurse Health Advisor. No abnormalities found.      6CIT Screen 05/16/2020 05/02/2019  What Year? 0 points 0 points  What month? 0 points 0 points  What time? 0 points 0 points  Count back from 20 0 points 0 points  Months in reverse 0 points 0 points  Repeat phrase 0 points 0 points  Total Score 0 0    Immunizations Immunization History  Administered Date(s) Administered   Fluad Quad(high Dose 65+) 08/09/2019   PFIZER(Purple Top)SARS-COV-2 Vaccination 06/27/2020, 07/18/2020   Pneumococcal  Conjugate-13 05/02/2019   Pneumococcal  Polysaccharide-23 05/31/2020   Tdap 04/21/2011   Zoster Recombinat (Shingrix) 10/19/2018, 04/11/2019   Zoster, Live 05/14/2011    TDAP status: Due, Education has been provided regarding the importance of this vaccine. Advised may receive this vaccine at local pharmacy or Health Dept. Aware to provide a copy of the vaccination record if obtained from local pharmacy or Health Dept. Verbalized acceptance and understanding.  Flu Vaccine status: Up to date  Pneumococcal vaccine status: Up to date  Covid-19 vaccine status: Declined, Education has been provided regarding the importance of this vaccine but patient still declined. Advised may receive this vaccine at local pharmacy or Health Dept.or vaccine clinic. Aware to provide a copy of the vaccination record if obtained from local pharmacy or Health Dept. Verbalized acceptance and understanding.  Qualifies for Shingles Vaccine? Yes   Zostavax completed Yes   Shingrix Completed?: Yes  Screening Tests Health Maintenance  Topic Date Due   COVID-19 Vaccine (3 - Pfizer risk series) 08/15/2020   TETANUS/TDAP  04/20/2021   INFLUENZA VACCINE  05/13/2021   COLONOSCOPY (Pts 45-51yr Insurance coverage will need to be confirmed)  07/20/2021   DEXA SCAN  11/17/2021   MAMMOGRAM  05/10/2023   Hepatitis C Screening  Completed   PNA vac Low Risk Adult  Completed   Zoster Vaccines- Shingrix  Completed   HPV VACCINES  Aged Out    Health Maintenance  Health Maintenance Due  Topic Date Due   COVID-19 Vaccine (3 - Pfizer risk series) 08/15/2020   TETANUS/TDAP  04/20/2021   INFLUENZA VACCINE  05/13/2021    Colorectal cancer screening: Referral to GI placed 04/2021. Pt aware the office will call re: appt. She has appt 07/18/2021  Mammogram status: Completed 05/09/2021. Repeat every year  Bone Density status: Completed 11/18/2019. Results reflect: Bone density results: OSTEOPENIA. Repeat every 2 years.  Lung Cancer Screening: (Low Dose CT Chest  recommended if Age 68-80years, 30 pack-year currently smoking OR have quit w/in 15years.) does not qualify.   Additional Screening:  Hepatitis C Screening: does qualify; Completed 10/05/2015  Vision Screening: Recommended annual ophthalmology exams for early detection of glaucoma and other disorders of the eye. Is the patient up to date with their annual eye exam?  Yes  Who is the provider or what is the name of the office in which the patient attends annual eye exams? [facility] [place] [place]igmund [place] is not established with a provider, would they like to be referred to a provider to establish care? No .   Dental Screening: Recommended annual dental exams for proper oral hygiene  Community Resource Referral / Chronic Care Management: CRR required this visit?  No   CCM required this visit?  No      Plan:     I have personally reviewed and noted the following in the patient's chart:   Medical and social history Use of alcohol, tobacco or illicit drugs  Current medications and supplements including opioid prescriptions.  Functional ability and status Nutritional status Physical activity Advanced directives List of other physicians Hospitalizations, surgeries, and ER visits in previous 12 months Vitals Screenings to include cognitive, depression, and falls Referrals and appointments  In addition, I have reviewed and discussed with patient certain preventive protocols, quality metrics, and best practice recommendations. A written personalized care plan for preventive services as well as general preventive health recommendations were provided to patient.  Sandrea Hammond, LPN   [id number]   Nurse Notes: None

## 2021-05-21 NOTE — Patient Instructions (Signed)
[name] [name] , Thank you for taking time to come for your Medicare Wellness Visit. I appreciate your ongoing commitment to your health goals. Please review the following plan we discussed and let me know if I can assist you in the future.   Screening recommendations/referrals: Colonoscopy: Done 07/21/2011 - Repeat in 10 years - Has appointment 07/18/21 Mammogram: Done 05/10/2021 - Repeat annually Bone Density: Done 11/18/2019 - Repeat every 2 years Recommended yearly ophthalmology/optometry visit for glaucoma screening and checkup Recommended yearly dental visit for hygiene and checkup  Vaccinations: Influenza vaccine: Due every fall Pneumococcal vaccine: one 05/02/2019 & 05/31/2020 Tdap vaccine: Done 04/21/2011 - Repeat in 10 years *Due Shingles vaccine: Done 10/19/2018 & 04/11/2019   Covid-19: Done 06/17/2020 & 07/18/2020 - declines boosters  Advanced directives: Advance directive discussed with you today. Even though you declined this today, please call our office should you change your mind, and we can give you the proper paperwork for you to fill out.   Conditions/risks identified: Aim for 30 minutes of exercise or brisk walking each day, drink 6-8 glasses of water and eat lots of fruits and vegetables.   Next appointment: Follow up in one year for your annual wellness visit    Preventive Care 65 Years and Older, Female Preventive care refers to lifestyle choices and visits with your health care provider that can promote health and wellness. What does preventive care include? A yearly physical exam. This is also called an annual well check. Dental exams once or twice a year. Routine eye exams. Ask your health care provider how often you should have your eyes checked. Personal lifestyle choices, including: Daily care of your teeth and gums. Regular physical activity. Eating a healthy diet. Avoiding tobacco and drug use. Limiting alcohol use. Practicing safe sex. Taking low-dose aspirin every  day. Taking vitamin and mineral supplements as recommended by your health care provider. What happens during an annual well check? The services and screenings done by your health care provider during your annual well check will depend on your age, overall health, lifestyle risk factors, and family history of disease. Counseling  Your health care provider may ask you questions about your: Alcohol use. Tobacco use. Drug use. Emotional well-being. Home and relationship well-being. Sexual activity. Eating habits. History of falls. Memory and ability to understand (cognition). Work and work Statistician. Reproductive health. Screening  You may have the following tests or measurements: Height, weight, and BMI. Blood pressure. Lipid and cholesterol levels. These may be checked every 5 years, or more frequently if you are over 22 years old. Skin check. Lung cancer screening. You may have this screening every year starting at age 22 if you have a 30-pack-year history of smoking and currently smoke or have quit within the past 15 years. Fecal occult blood test (FOBT) of the stool. You may have this test every year starting at age 45. Flexible sigmoidoscopy or colonoscopy. You may have a sigmoidoscopy every 5 years or a colonoscopy every 10 years starting at age 36. Hepatitis C blood test. Hepatitis B blood test. Sexually transmitted disease (STD) testing. Diabetes screening. This is done by checking your blood sugar (glucose) after you have not eaten for a while (fasting). You may have this done every 1-3 years. Bone density scan. This is done to screen for osteoporosis. You may have this done starting at age 30. Mammogram. This may be done every 1-2 years. Talk to your health care provider about how often you should have regular mammograms. Talk  with your health care provider about your test results, treatment options, and if necessary, the need for more tests. Vaccines  Your health care  provider may recommend certain vaccines, such as: Influenza vaccine. This is recommended every year. Tetanus, diphtheria, and acellular pertussis (Tdap, Td) vaccine. You may need a Td booster every 10 years. Zoster vaccine. You may need this after age 83. Pneumococcal 13-valent conjugate (PCV13) vaccine. One dose is recommended after age 68. Pneumococcal polysaccharide (PPSV23) vaccine. One dose is recommended after age 36. Talk to your health care provider about which screenings and vaccines you need and how often you need them. This information is not intended to replace advice given to you by your health care provider. Make sure you discuss any questions you have with your health care provider. Document Released: 10/26/2015 Document Revised: 06/18/2016 Document Reviewed: 07/31/2015 Elsevier Interactive Patient Education  2017 [place] Prevention in the Home Falls can cause injuries. They can happen to people of all ages. There are many things you can do to make your home safe and to help prevent falls. What can I do on the outside of my home? Regularly fix the edges of walkways and driveways and fix any cracks. Remove anything that might make you trip as you walk through a door, such as a raised step or threshold. Trim any bushes or trees on the path to your home. Use bright outdoor lighting. Clear any walking paths of anything that might make someone trip, such as rocks or tools. Regularly check to see if handrails are loose or broken. Make sure that both sides of any steps have handrails. Any raised decks and porches should have guardrails on the edges. Have any leaves, snow, or ice cleared regularly. Use sand or salt on walking paths during winter. Clean up any spills in your garage right away. This includes oil or grease spills. What can I do in the bathroom? Use night lights. Install grab bars by the toilet and in the tub and shower. Do not use towel bars as grab  bars. Use non-skid mats or decals in the tub or shower. If you need to sit down in the shower, use a plastic, non-slip stool. Keep the floor dry. Clean up any water that spills on the floor as soon as it happens. Remove soap buildup in the tub or shower regularly. Attach bath mats securely with double-sided non-slip rug tape. Do not have throw rugs and other things on the floor that can make you trip. What can I do in the bedroom? Use night lights. Make sure that you have a light by your bed that is easy to reach. Do not use any sheets or blankets that are too big for your bed. They should not hang down onto the floor. Have a firm chair that has side arms. You can use this for support while you get dressed. Do not have throw rugs and other things on the floor that can make you trip. What can I do in the kitchen? Clean up any spills right away. Avoid walking on wet floors. Keep items that you use a lot in easy-to-reach places. If you need to reach something above you, use a strong step stool that has a grab bar. Keep electrical cords out of the way. Do not use floor polish or wax that makes floors slippery. If you must use wax, use non-skid floor wax. Do not have throw rugs and other things on the floor that can make you  trip. What can I do with my stairs? Do not leave any items on the stairs. Make sure that there are handrails on both sides of the stairs and use them. Fix handrails that are broken or loose. Make sure that handrails are as long as the stairways. Check any carpeting to make sure that it is firmly attached to the stairs. Fix any carpet that is loose or worn. Avoid having throw rugs at the top or bottom of the stairs. If you do have throw rugs, attach them to the floor with carpet tape. Make sure that you have a light switch at the top of the stairs and the bottom of the stairs. If you do not have them, ask someone to add them for you. What else can I do to help prevent  falls? Wear shoes that: Do not have high heels. Have rubber bottoms. Are comfortable and fit you well. Are closed at the toe. Do not wear sandals. If you use a stepladder: Make sure that it is fully opened. Do not climb a closed stepladder. Make sure that both sides of the stepladder are locked into place. Ask someone to hold it for you, if possible. Clearly mark and make sure that you can see: Any grab bars or handrails. First and last steps. Where the edge of each step is. Use tools that help you move around (mobility aids) if they are needed. These include: Canes. Walkers. Scooters. Crutches. Turn on the lights when you go into a dark area. Replace any light bulbs as soon as they burn out. Set up your furniture so you have a clear path. Avoid moving your furniture around. If any of your floors are uneven, fix them. If there are any pets around you, be aware of where they are. Review your medicines with your doctor. Some medicines can make you feel dizzy. This can increase your chance of falling. Ask your doctor what other things that you can do to help prevent falls. This information is not intended to replace advice given to you by your health care provider. Make sure you discuss any questions you have with your health care provider. Document Released: 07/26/2009 Document Revised: 03/06/2016 Document Reviewed: 11/03/2014 Elsevier Interactive Patient Education  2017 [employer].

## 2021-05-22 ENCOUNTER — Encounter: Payer: Self-pay | Admitting: Family Medicine

## 2021-05-22 ENCOUNTER — Other Ambulatory Visit: Payer: Self-pay

## 2021-05-22 ENCOUNTER — Ambulatory Visit (INDEPENDENT_AMBULATORY_CARE_PROVIDER_SITE_OTHER): Payer: Medicare PPO | Admitting: Family Medicine

## 2021-05-22 VITALS — BP 122/79 | HR 78 | Temp 97.6°F | Ht 65.0 in | Wt 138.6 lb

## 2021-05-22 DIAGNOSIS — E559 Vitamin D deficiency, unspecified: Secondary | ICD-10-CM

## 2021-05-22 DIAGNOSIS — R059 Cough, unspecified: Secondary | ICD-10-CM | POA: Diagnosis not present

## 2021-05-22 DIAGNOSIS — Z01419 Encounter for gynecological examination (general) (routine) without abnormal findings: Secondary | ICD-10-CM

## 2021-05-22 DIAGNOSIS — N904 Leukoplakia of vulva: Secondary | ICD-10-CM

## 2021-05-22 DIAGNOSIS — Z01411 Encounter for gynecological examination (general) (routine) with abnormal findings: Secondary | ICD-10-CM

## 2021-05-22 DIAGNOSIS — E039 Hypothyroidism, unspecified: Secondary | ICD-10-CM | POA: Diagnosis not present

## 2021-05-22 DIAGNOSIS — E7841 Elevated Lipoprotein(a): Secondary | ICD-10-CM

## 2021-05-22 DIAGNOSIS — F411 Generalized anxiety disorder: Secondary | ICD-10-CM

## 2021-05-22 NOTE — Patient Instructions (Signed)
You had labs performed today.  You will be contacted with the results of the labs once they are available, usually in the next 3 business days for routine lab work.  If you have an active my chart account, they will be released to your MyChart.  If you prefer to have these labs released to you via telephone, please let us know.  If you had a pap smear or biopsy performed, expect to be contacted in about 7-10 days.  Preventive Care 65 Years and Older, Female Preventive care refers to lifestyle choices and visits with your health care provider that can promote health and wellness. This includes: A yearly physical exam. This is also called an annual wellness visit. Regular dental and eye exams. Immunizations. Screening for certain conditions. Healthy lifestyle choices, such as: Eating a healthy diet. Getting regular exercise. Not using drugs or products that contain nicotine and tobacco. Limiting alcohol use. What can I expect for my preventive care visit? Physical exam Your health care provider will check your: Height and weight. These may be used to calculate your BMI (body mass index). BMI is a measurement that tells if you are at a healthy weight. Heart rate and blood pressure. Body temperature. Skin for abnormal spots. Counseling Your health care provider may ask you questions about your: Past medical problems. Family's medical history. Alcohol, tobacco, and drug use. Emotional well-being. Home life and relationship well-being. Sexual activity. Diet, exercise, and sleep habits. History of falls. Memory and ability to understand (cognition). Work and work environment. Pregnancy and menstrual history. Access to firearms. What immunizations do I need?  Vaccines are usually given at various ages, according to a schedule. Your health care provider will recommend vaccines for you based on your age, medicalhistory, and lifestyle or other factors, such as travel or where you  work. What tests do I need? Blood tests Lipid and cholesterol levels. These may be checked every 5 years, or more often depending on your overall health. Hepatitis C test. Hepatitis B test. Screening Lung cancer screening. You may have this screening every year starting at age 55 if you have a 30-pack-year history of smoking and currently smoke or have quit within the past 15 years. Colorectal cancer screening. All adults should have this screening starting at age 50 and continuing until age 75. Your health care provider may recommend screening at age 45 if you are at increased risk. You will have tests every 1-10 years, depending on your results and the type of screening test. Diabetes screening. This is done by checking your blood sugar (glucose) after you have not eaten for a while (fasting). You may have this done every 1-3 years. Mammogram. This may be done every 1-2 years. Talk with your health care provider about how often you should have regular mammograms. Abdominal aortic aneurysm (AAA) screening. You may need this if you are a current or former smoker. BRCA-related cancer screening. This may be done if you have a family history of breast, ovarian, tubal, or peritoneal cancers. Other tests STD (sexually transmitted disease) testing, if you are at risk. Bone density scan. This is done to screen for osteoporosis. You may have this done starting at age 65. Talk with your health care provider about your test results, treatment options,and if necessary, the need for more tests. Follow these instructions at home: Eating and drinking  Eat a diet that includes fresh fruits and vegetables, whole grains, lean protein, and low-fat dairy products. Limit your intake of foods with   high amounts of sugar, saturated fats, and salt. Take vitamin and mineral supplements as recommended by your health care provider. Do not drink alcohol if your health care provider tells you not to drink. If you  drink alcohol: Limit how much you have to 0-1 drink a day. Be aware of how much alcohol is in your drink. In the [place] one drink equals one 12 oz bottle of beer (355 mL), one 5 oz glass of wine (148 mL), or one 1 oz glass of hard liquor (44 mL).  Lifestyle Take daily care of your teeth and gums. Brush your teeth every morning and night with fluoride toothpaste. Floss one time each day. Stay active. Exercise for at least 30 minutes 5 or more days each week. Do not use any products that contain nicotine or tobacco, such as cigarettes, e-cigarettes, and chewing tobacco. If you need help quitting, ask your health care provider. Do not use drugs. If you are sexually active, practice safe sex. Use a condom or other form of protection in order to prevent STIs (sexually transmitted infections). Talk with your health care provider about taking a low-dose aspirin or statin. Find healthy ways to cope with stress, such as: Meditation, yoga, or listening to music. Journaling. Talking to a trusted person. Spending time with friends and family. Safety Always wear your seat belt while driving or riding in a vehicle. Do not drive: If you have been drinking alcohol. Do not ride with someone who has been drinking. When you are tired or distracted. While texting. Wear a helmet and other protective equipment during sports activities. If you have firearms in your house, make sure you follow all gun safety procedures. What's next? Visit your health care provider once a year for an annual wellness visit. Ask your health care provider how often you should have your eyes and teeth checked. Stay up to date on all vaccines. This information is not intended to replace advice given to you by your health care provider. Make sure you discuss any questions you have with your healthcare provider. Document Revised: 09/19/2020 Document Reviewed: 09/23/2018 Elsevier Patient Education  2022 Elsevier Inc.  

## 2021-05-22 NOTE — Progress Notes (Signed)
 Marycatherine Hersh is a 70 y.o. female presents to office today for annual physical exam examination.    Concerns today include: 1.  Hypothyroidism Compliant with Synthroid.  No change in was, difficulty swallowing, tremor or heart palpitations.  No change in bowel habits  2.  Anxiety  Patient reports good control with Lexapro.  No changes needed  3.  Lichen sclerosus of the vagina Uses clobetasol intermittently.  Notes that irritation is much less than onset.  Is sexually active and sometimes vagina becomes irritated with intercourse.  No abnormal vaginal discharge, bleeding or pelvic pain  4.  Hyperlipidemia Compliant with Lipitor 40 mg daily.  No chest pain or shortness of breath.  Does have a intermittent, chronic cough that has been ongoing for few years now.  No hemoptysis, brown sputum, shortness of breath, wheezing, unplanned weight loss, night sweats.  Never a smoker.  No secondhand smoke exposure.  No chemical exposure at work.  Has acid reflux but is using Pepcid twice daily.  Uses Zyrtec for allergies but does sometimes have drainage on the right nare.  Occupation: retired  Last eye exam: sees retinal specialist w/ appt scheduled November 2022 Last dental exam: UTD Last colonoscopy: 07/21/2011, 10 year f/u Last mammogram: 05/09/21, 2 year f/u Last pap smear: n/a >65 yo Refills needed today: Synthroid. Immunizations needed: Immunization History  Administered Date(s) Administered   Fluad Quad(high Dose 65+) 08/09/2019   PFIZER(Purple Top)SARS-COV-2 Vaccination 06/27/2020, 07/18/2020   Pneumococcal Conjugate-13 05/02/2019   Pneumococcal Polysaccharide-23 05/31/2020   Tdap 04/21/2011   Zoster Recombinat (Shingrix) 10/19/2018, 04/11/2019   Zoster, Live 05/14/2011     Past Medical History:  Diagnosis Date   Allergy    Anxiety    BCC (basal cell carcinoma of skin) 07/31/2016   Superficial Bcc left shoulder   History of cold sores    Hyperlipidemia    Osteopenia     Rosacea    Thyroid disease    hypothyroid   Social History   Socioeconomic History   Marital status: Married    Spouse name: Don    Number of children: 1   Years of education: 12   Highest education level: High school graduate  Occupational History   Occupation: Cafeteria    Employer: [employer]  Tobacco Use   Smoking status: Never   Smokeless tobacco: Never  Vaping Use   Vaping Use: Never used  Substance and Sexual Activity   Alcohol use: No   Drug use: No   Sexual activity: Yes    Partners: Male  Other Topics Concern   Not on file  Social History Narrative   Patient is married.  Her husband recently retired last March and they have been trying to travel more.   She has a daughter who lives locally.  She has grandchildren.  She is active in her church and tries to stay physically active.   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  She has been retired for a couple of years and is enjoying retirement quite a bit   Social Determinants of Health   Financial Resource Strain: Low Risk    Difficulty of Paying Living Expenses: Not hard at all  Food Insecurity: No Food Insecurity   Worried About Running Out of Food in the Last Year: Never true   Ran Out of Food in the Last Year: Never true  Transportation Needs: No Transportation Needs     Lack of Transportation (Medical): No   Lack of Transportation (Non-Medical): No  Physical Activity: Insufficiently Active   Days of Exercise per Week: 3 days   Minutes of Exercise per Session: 30 min  Stress: No Stress Concern Present   Feeling of Stress : Not at all  Social Connections: Socially Integrated   Frequency of Communication with Friends and Family: More than three times a week   Frequency of Social Gatherings with Friends and Family: More than three times a week   Attends Religious Services: More than 4 times per year   Active Member of Clubs or Organizations:  Yes   Attends Club or Organization Meetings: 1 to 4 times per year   Marital Status: Married  Intimate Partner Violence: Not At Risk   Fear of Current or Ex-Partner: No   Emotionally Abused: No   Physically Abused: No   Sexually Abused: No   Past Surgical History:  Procedure Laterality Date   skin cancer removal     TUBAL LIGATION     WISDOM TOOTH EXTRACTION     Family History  Problem Relation Age of Onset   Glaucoma Mother    Cerebral aneurysm Mother    Macular degeneration Mother    Stroke Father    Hypertension Father    Lung cancer Father    Heart attack Paternal Uncle    Heart attack Maternal Uncle    Brain cancer Brother    Colon cancer Neg Hx    Stomach cancer Neg Hx    Esophageal cancer Neg Hx     Current Outpatient Medications:    atorvastatin (LIPITOR) 40 MG tablet, TAKE 1 TABLET EVERY DAY, Disp: 90 tablet, Rfl: 2   Calcium Carbonate-Vitamin D (CALCIUM 500/D PO), Take by mouth., Disp: , Rfl:    cetirizine (ZYRTEC) 10 MG tablet, Take 1 tablet (10 mg total) by mouth daily., Disp: 90 tablet, Rfl: 1   Cholecalciferol (VITAMIN D3) 2000 UNITS TABS, Take 1 tablet by mouth daily., Disp: , Rfl:    clobetasol ointment (TEMOVATE) 0.05 %, Apply to affected areas on external vagina nightly as needed for lichen sclerosis, Disp: 45 g, Rfl: prn   escitalopram (LEXAPRO) 10 MG tablet, TAKE 1 TABLET EVERY DAY, Disp: 90 tablet, Rfl: 2   famotidine (PEPCID) 20 MG tablet, Take 1 tablet (20 mg total) by mouth 2 (two) times daily., Disp:  , Rfl:    fluticasone (FLONASE) 50 MCG/ACT nasal spray, Place 2 sprays into both nostrils daily., Disp: 16 g, Rfl: 6   levothyroxine (SYNTHROID) 75 MCG tablet, Take 1 tablet (75 mcg total) by mouth daily., Disp: 90 tablet, Rfl: 3  No Known Allergies   ROS: Review of Systems Pertinent items noted in HPI and remainder of comprehensive ROS otherwise negative.    Physical exam BP 122/79   Pulse 78   Temp 97.6 F (36.4 C)   Ht 5' 5" (1.651 m)    Wt 138 lb 9.6 oz (62.9 kg)   SpO2 99%   BMI 23.06 kg/m  General appearance: alert, cooperative, appears stated age, and no distress Head: Normocephalic, without obvious abnormality, atraumatic Eyes: negative findings: lids and lashes normal, conjunctivae and sclerae normal, corneas clear, and pupils equal, round, reactive to light and accomodation Ears: normal TM's and external ear canals both ears Nose: Nares normal. Septum midline. Mucosa normal. No drainage or sinus tenderness. Throat: lips, mucosa, and tongue normal; teeth and gums normal Neck: no adenopathy, no carotid bruit, supple, symmetrical, trachea midline, and   thyroid not enlarged, symmetric, no tenderness/mass/nodules Back: symmetric, no curvature. ROM normal. No CVA tenderness. Lungs: clear to auscultation bilaterally Heart: regular rate and rhythm, S1, S2 normal, no murmur, click, rub or gallop Abdomen: soft, non-tender; bowel sounds normal; no masses,  no organomegaly Pelvic: cervix normal in appearance, no adnexal masses or tenderness, no cervical motion tenderness, rectovaginal septum normal, vagina normal without discharge, and external vaginal tissue with evidence of lichen sclerosis at the posterior fourchette and mucosal side lower labia majora Extremities: extremities normal, atraumatic, no cyanosis or edema; surgically absent left great toenail Pulses: 2+ and symmetric Skin: Skin color, texture, turgor normal. No rashes or lesions Lymph nodes: Cervical, supraclavicular, and axillary nodes normal. Neurologic: Alert and oriented X 3, normal strength and tone. Normal symmetric reflexes. Normal coordination and gait Psych: Mood stable, speech normal, affect appropriate  Depression screen [facility] 2/9 05/22/2021 05/21/2021 11/15/2020  Decreased Interest 0 0 0  Down, Depressed, Hopeless 0 0 0  PHQ - 2 Score 0 0 0  Altered sleeping - - 0  Tired, decreased energy - - 0  Change in appetite - - 0  Feeling bad or failure about  yourself  - - 0  Trouble concentrating - - 0  Moving slowly or fidgety/restless - - 0  Suicidal thoughts - - 0  PHQ-9 Score - - 0   GAD 7 : Generalized Anxiety Score 05/22/2021 04/21/2018  Nervous, Anxious, on Edge 0 3  Control/stop worrying 0 3  Worry too much - different things 0 3  Trouble relaxing 0 1  Restless 0 0  Easily annoyed or irritable 0 0  Afraid - awful might happen 0 1  Total GAD 7 Score 0 11  Anxiety Difficulty Not difficult at all Not difficult at all   Assessment/ Plan: [name] [name] here for annual physical exam.   Well woman exam with routine gynecological exam  Lichen sclerosus of female genitalia - Plan: CBC  Hypothyroidism (acquired) - Plan: TSH, T4, Free  Generalized anxiety disorder  Elevated lipoprotein(a) - Plan: CMP14+EGFR, Lipid Panel  Vitamin D deficiency - Plan: VITAMIN D 25 Hydroxy (Vit-D Deficiency, Fractures)  Cough  Up-to-date on preventative health care.  Plan for influenza vaccine in the fall.  Lichen sclerosus is well controlled with clobetasol.  Asymptomatic from a thyroid standpoint.  Check thyroid labs  Anxiety disorder is stable with Lexapro  Check fasting lipid, CMP  Check vitamin D level given known vitamin D deficiency  Cough is intermittent.  She has no red flag signs or symptoms.  No cobblestone appearance of the oropharynx to suggest allergic mediated cough.  Recommend continued use of Zyrtec.  Consider nasal saline rinses.  Could trial 2 to 4-week PPI as this may be a manifestation of uncontrolled reflux.  If persistent or if worsening, low threshold to obtain chest x-ray and evaluate further  Counseled on healthy lifestyle choices, including diet (rich in fruits, vegetables and lean meats and low in salt and simple carbohydrates) and exercise (at least 30 minutes of moderate physical activity daily).  Patient to follow up in 1 year for annual exam or sooner if needed.  Elody Kleinsasser M. Lajuana Ripple, DO

## 2021-05-23 LAB — CBC
Hematocrit: 39.5 % (ref 34.0–46.6)
Hemoglobin: 12.9 g/dL (ref 11.1–15.9)
MCH: 28.9 pg (ref 26.6–33.0)
MCHC: 32.7 g/dL (ref 31.5–35.7)
MCV: 88 fL (ref 79–97)
Platelets: 295 10*3/uL (ref 150–450)
RBC: 4.47 x10E6/uL (ref 3.77–5.28)
RDW: 12.1 % (ref 11.7–15.4)
WBC: 5.9 10*3/uL (ref 3.4–10.8)

## 2021-05-23 LAB — LIPID PANEL
Chol/HDL Ratio: 2.5 ratio (ref 0.0–4.4)
Cholesterol, Total: 113 mg/dL (ref 100–199)
HDL: 45 mg/dL (ref >39)
LDL Chol Calc (NIH): 52 mg/dL (ref 0–99)
Triglycerides: 82 mg/dL (ref 0–149)
VLDL Cholesterol Cal: 16 mg/dL (ref 5–40)

## 2021-05-23 LAB — CMP14+EGFR
ALT: 21 IU/L (ref 0–32)
AST: 16 IU/L (ref 0–40)
Albumin/Globulin Ratio: 2 (ref 1.2–2.2)
Albumin: 4.1 g/dL (ref 3.8–4.8)
Alkaline Phosphatase: 85 IU/L (ref 44–121)
BUN/Creatinine Ratio: 13 (ref 12–28)
BUN: 12 mg/dL (ref 8–27)
Bilirubin Total: 0.5 mg/dL (ref 0.0–1.2)
CO2: 26 mmol/L (ref 20–29)
Calcium: 9 mg/dL (ref 8.7–10.3)
Chloride: 104 mmol/L (ref 96–106)
Creatinine, Ser: 0.89 mg/dL (ref 0.57–1.00)
Globulin, Total: 2.1 g/dL (ref 1.5–4.5)
Glucose: 91 mg/dL (ref 65–99)
Potassium: 4.2 mmol/L (ref 3.5–5.2)
Sodium: 140 mmol/L (ref 134–144)
Total Protein: 6.2 g/dL (ref 6.0–8.5)
eGFR: 71 mL/min/{1.73_m2} (ref >59)

## 2021-05-23 LAB — T4, FREE: Free T4: 1.6 ng/dL (ref 0.82–1.77)

## 2021-05-23 LAB — VITAMIN D 25 HYDROXY (VIT D DEFICIENCY, FRACTURES): Vit D, 25-Hydroxy: 85.7 ng/mL (ref 30.0–100.0)

## 2021-05-23 LAB — TSH: TSH: 0.5 u[IU]/mL (ref 0.450–4.500)

## 2021-05-24 ENCOUNTER — Other Ambulatory Visit: Payer: Self-pay | Admitting: Family Medicine

## 2021-05-24 MED ORDER — LEVOTHYROXINE SODIUM 75 MCG PO TABS: 75.0000 ug | ORAL_TABLET | Freq: Every day | ORAL | 3 refills | Status: DC

## 2021-06-12 ENCOUNTER — Ambulatory Visit: Payer: Medicare PPO | Admitting: Family Medicine

## 2021-06-12 ENCOUNTER — Ambulatory Visit (INDEPENDENT_AMBULATORY_CARE_PROVIDER_SITE_OTHER): Payer: Medicare PPO

## 2021-06-12 DIAGNOSIS — G8929 Other chronic pain: Secondary | ICD-10-CM

## 2021-06-12 DIAGNOSIS — M25552 Pain in left hip: Secondary | ICD-10-CM | POA: Diagnosis not present

## 2021-06-12 DIAGNOSIS — M545 Low back pain, unspecified: Secondary | ICD-10-CM | POA: Diagnosis not present

## 2021-06-12 IMAGING — DX DG LUMBAR SPINE 2-3V
2 series · 2 of 2 positions shown · non-contrast
Comparison: None.

CLINICAL DATA: Back pain

EXAM:
LUMBAR SPINE - 2-3 VIEW

[l-spine ap]
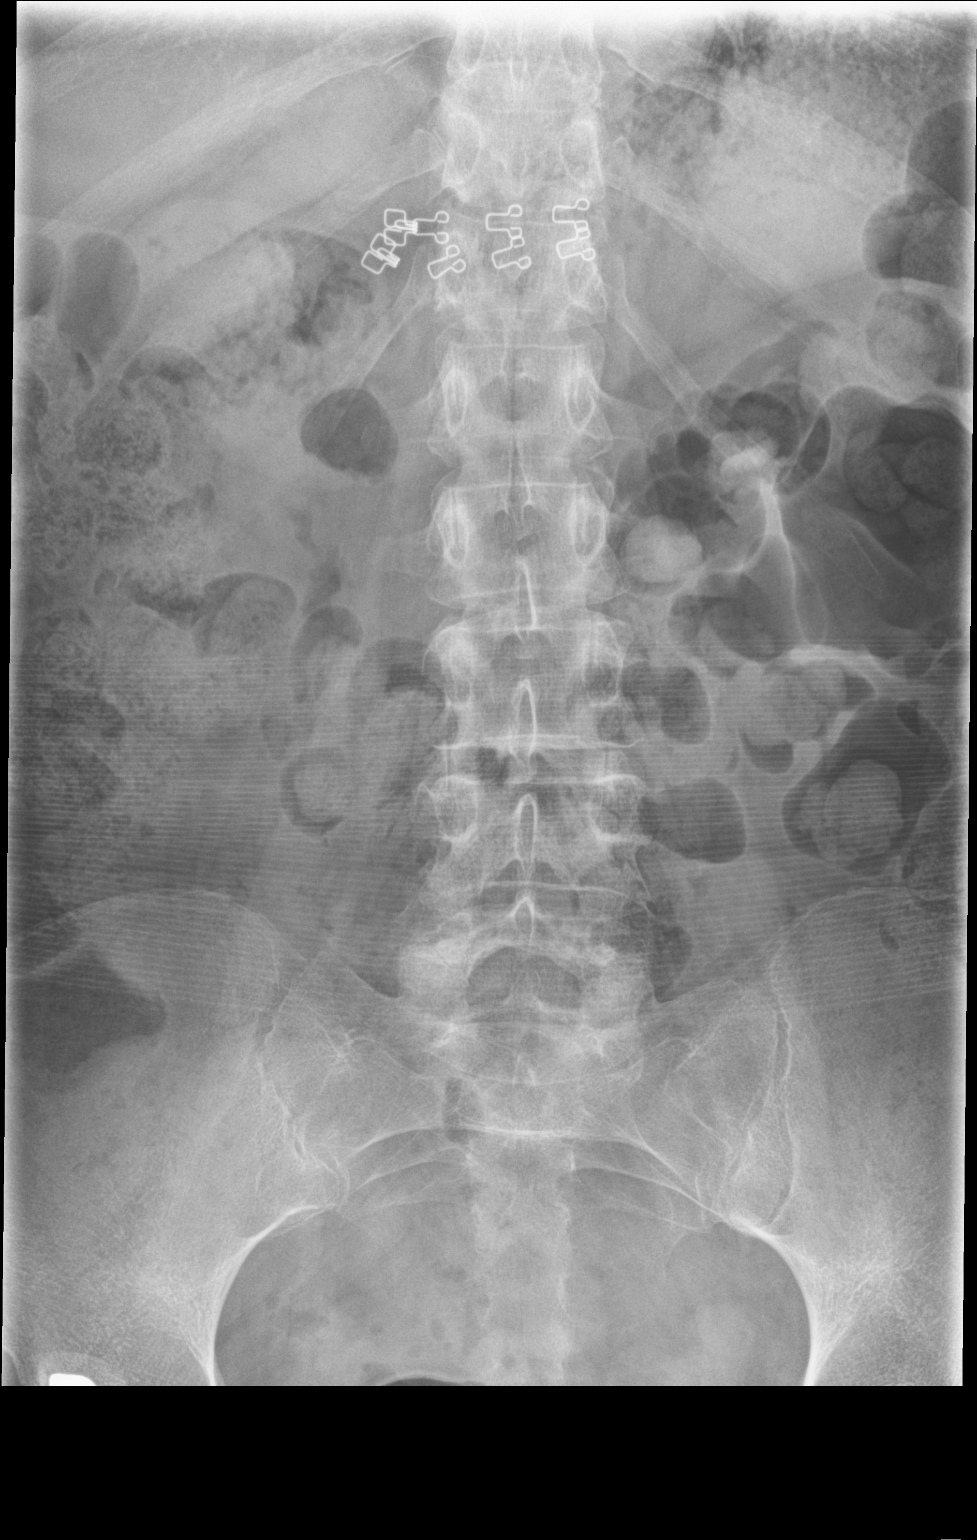

[l-spine lat]
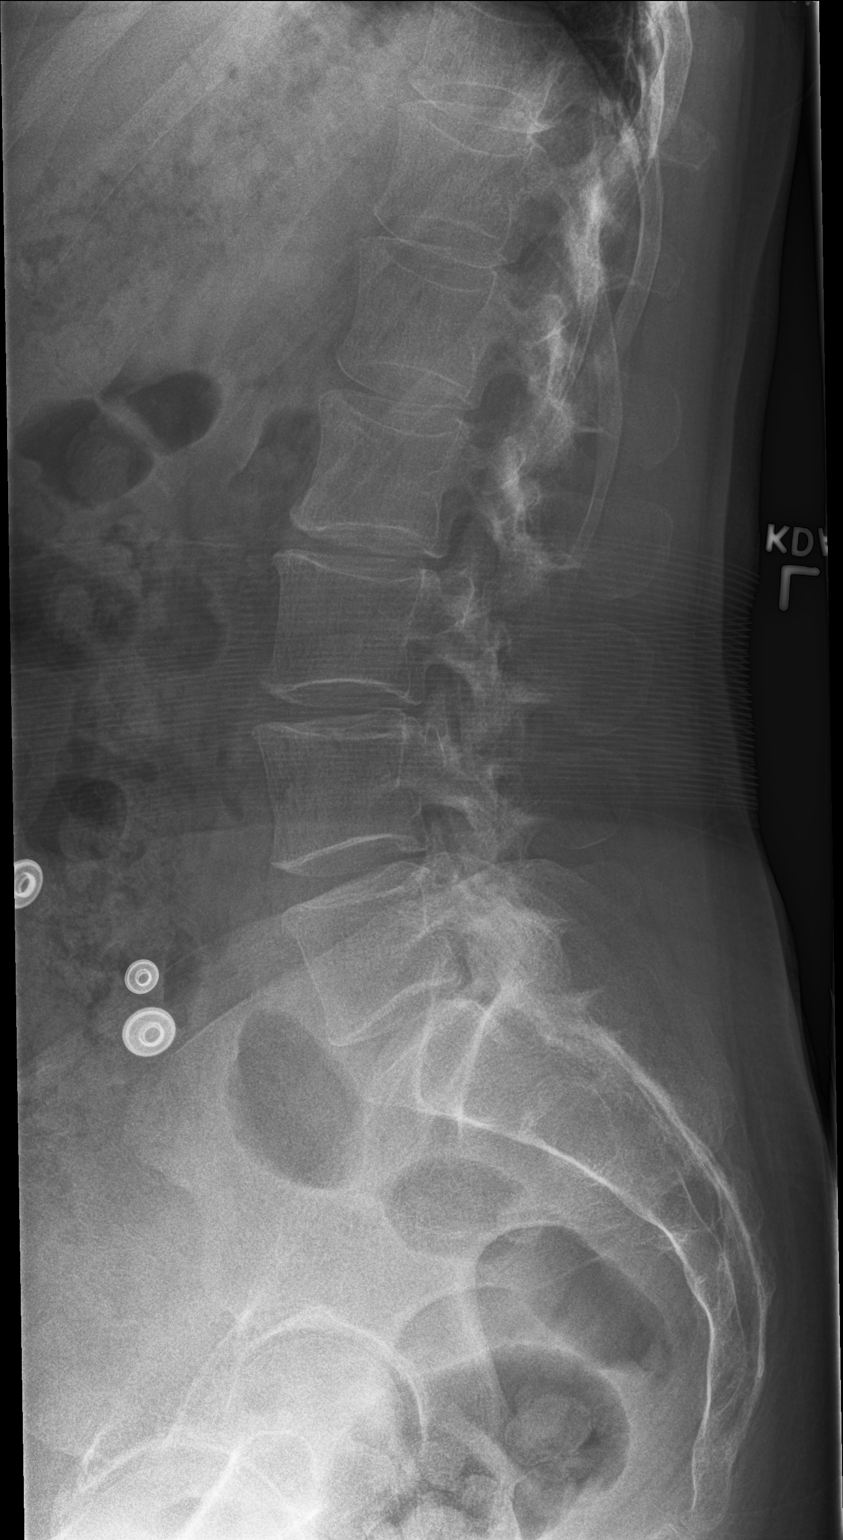

[2 of 2 positions shown; findings below may reference images not displayed]

FINDINGS: Lumbar alignment within normal limits. Vertebral body heights are
maintained. Minimal disc space narrowing at L2-L3. Facet
degenerative changes of the lower lumbar spine
IMPRESSION: Minimal degenerative change.  No acute osseous abnormality

## 2021-06-12 NOTE — Progress Notes (Signed)
Telephone visit  Subjective: CC: Hip pain PCP: Janora Norlander, DO [id number] Demory is a 70 y.o. female calls for telephone consult today. Patient provides verbal consent for consult held via phone.  Due to COVID-19 pandemic this visit was conducted virtually. This visit type was conducted due to national recommendations for restrictions regarding the COVID-19 Pandemic (e.g. social distancing, sheltering in place) in an effort to limit this patient's exposure and mitigate transmission in our community. All issues noted in this document were discussed and addressed.  A physical exam was not performed with this format.   Location of patient: home Location of provider: [facility] Others present for call: none  1. Hip pain Has had a few episodes of left sided hip pain/ gait change over the last few months.  She went to beach 2 weeks ago and after shopping all day symptoms returned but did not resolve until she rested for a long period of time.  She reports some associated back pain at the waist line on that side.  She reports sensation of some stiffness in that left leg.  No radiation down leg.  Pain is at the "ball of her hip" without radiation to groin.  No injury.  Not currently experiencing the pain now but she was worried about ongoing episodes.   ROS: Per HPI  No Known Allergies Past Medical History:  Diagnosis Date   Allergy    Anxiety    BCC (basal cell carcinoma of skin) 07/31/2016   Superficial Bcc left shoulder   History of cold sores    Hyperlipidemia    Osteopenia    Rosacea    Thyroid disease    hypothyroid    Current Outpatient Medications:    atorvastatin (LIPITOR) 40 MG tablet, TAKE 1 TABLET EVERY DAY, Disp: 90 tablet, Rfl: 2   Calcium Carbonate-Vitamin D (CALCIUM 500/D PO), Take by mouth., Disp: , Rfl:    cetirizine (ZYRTEC) 10 MG tablet, Take 1 tablet (10 mg total) by mouth daily., Disp: 90 tablet, Rfl: 1   Cholecalciferol (VITAMIN D3) 2000 UNITS TABS, Take 1  tablet by mouth daily., Disp: , Rfl:    clobetasol ointment (TEMOVATE) 0.05 %, Apply to affected areas on external vagina nightly as needed for lichen sclerosis, Disp: 45 g, Rfl: prn   escitalopram (LEXAPRO) 10 MG tablet, TAKE 1 TABLET EVERY DAY, Disp: 90 tablet, Rfl: 2   famotidine (PEPCID) 20 MG tablet, Take 1 tablet (20 mg total) by mouth 2 (two) times daily., Disp:  , Rfl:    fluticasone (FLONASE) 50 MCG/ACT nasal spray, Place 2 sprays into both nostrils daily., Disp: 16 g, Rfl: 6   levothyroxine (SYNTHROID) 75 MCG tablet, Take 1 tablet (75 mcg total) by mouth daily., Disp: 90 tablet, Rfl: 3  Assessment/ Plan: 70 y.o. female   Chronic left hip pain - Plan: DG Lumbar Spine 2-3 Views, DG HIP UNILAT W OR W/O PELVIS 2-3 VIEWS LEFT  I do question if this may be coming from her low back and is simply a referred pain to her hip as it is not involving the groin at all.  We will obtain plain films of both the lumbar spine and left hip for completion.  We discussed that if this is in fact degenerative and not found to be severe we could simply proceed with home physical therapy stretches and supportive care with as needed NSAIDs.  Otherwise, we will plan for formal evaluation by Orthopedics.  Plan pending imaging results and ongoing symptomology  Start time: 9:02am (LVM); 9:17am End time: 9:23pm  Total time spent on patient care (including telephone call/ virtual visit): 6 minutes  [place], [place] [phone]

## 2021-07-04 ENCOUNTER — Other Ambulatory Visit: Payer: Self-pay

## 2021-07-04 ENCOUNTER — Ambulatory Visit (AMBULATORY_SURGERY_CENTER): Payer: Medicare PPO | Admitting: *Deleted

## 2021-07-04 VITALS — Ht 65.0 in | Wt 138.0 lb

## 2021-07-04 DIAGNOSIS — Z1211 Encounter for screening for malignant neoplasm of colon: Secondary | ICD-10-CM

## 2021-07-04 MED ORDER — NA SULFATE-K SULFATE-MG SULF 17.5-3.13-1.6 GM/177ML PO SOLN: 1.0000 | Freq: Once | ORAL | 0 refills | Status: AC

## 2021-07-04 NOTE — Progress Notes (Signed)
No egg or soy allergy known to patient  issues known to pt with past sedation with any surgeries or procedures- PONV 1 x in the past  Patient denies ever being told they had issues or difficulty with intubation  No FH of Malignant Hyperthermia Pt is not on diet pills Pt is not on  home 02  Pt is not on blood thinners  Pt denies issues with constipation  No A fib or A flutter    Pt is fully vaccinated  for Covid    Due to the COVID-19 pandemic we are asking patients to follow certain guidelines.  Pt aware of COVID protocols and [facility] guidelines   Pt verified name, DOB, address and insurance during PV today.  Pt mailed instruction packet of Emmi video, copy of consent form to read and not return, and instructions.  PV completed over the phone.  Pt encouraged to call with questions or issues.  My Chart instructions to pt as well

## 2021-07-16 ENCOUNTER — Encounter: Payer: Self-pay | Admitting: Certified Registered Nurse Anesthetist

## 2021-07-17 ENCOUNTER — Other Ambulatory Visit: Payer: Self-pay

## 2021-07-17 ENCOUNTER — Ambulatory Visit (AMBULATORY_SURGERY_CENTER): Payer: Medicare PPO | Admitting: Gastroenterology

## 2021-07-17 ENCOUNTER — Encounter: Payer: Self-pay | Admitting: Gastroenterology

## 2021-07-17 VITALS — BP 102/52 | HR 68 | Temp 97.3°F | Resp 16 | Ht 65.0 in | Wt 137.0 lb

## 2021-07-17 DIAGNOSIS — Z1211 Encounter for screening for malignant neoplasm of colon: Secondary | ICD-10-CM | POA: Diagnosis not present

## 2021-07-17 DIAGNOSIS — E7800 Pure hypercholesterolemia, unspecified: Secondary | ICD-10-CM | POA: Diagnosis not present

## 2021-07-17 DIAGNOSIS — D123 Benign neoplasm of transverse colon: Secondary | ICD-10-CM

## 2021-07-17 DIAGNOSIS — K219 Gastro-esophageal reflux disease without esophagitis: Secondary | ICD-10-CM | POA: Diagnosis not present

## 2021-07-17 DIAGNOSIS — D122 Benign neoplasm of ascending colon: Secondary | ICD-10-CM | POA: Diagnosis not present

## 2021-07-17 MED ORDER — SODIUM CHLORIDE 0.9 % IV SOLN: 500.0000 mL | Freq: Once | INTRAVENOUS | Status: DC

## 2021-07-17 NOTE — Progress Notes (Signed)
HPI: This is a woman at routine risk for colon cancer   ROS: complete GI ROS as described in HPI, all other review negative.  Constitutional:  No unintentional weight loss   Past Medical History:  Diagnosis Date   Allergy    Anxiety    Arthritis    mild hips - left worse   BCC (basal cell carcinoma of skin) 07/31/2016   Superficial Bcc left shoulder   Cataract    forming   GERD (gastroesophageal reflux disease)    History of cold sores    Hyperlipidemia    Osteopenia    Rosacea    SVT (supraventricular tachycardia) (HCC)    past hx - no issues   Thyroid disease    hypothyroid    Past Surgical History:  Procedure Laterality Date   COLONOSCOPY     07-21-2011   skin cancer removal     tiny per pt   TUBAL LIGATION     WISDOM TOOTH EXTRACTION      Current Outpatient Medications  Medication Sig Dispense Refill   atorvastatin (LIPITOR) 40 MG tablet TAKE 1 TABLET EVERY DAY 90 tablet 2   Calcium Carbonate-Vitamin D (CALCIUM 500/D PO) Take by mouth.     cetirizine (ZYRTEC) 10 MG tablet Take 1 tablet (10 mg total) by mouth daily. (Patient taking differently: Take 10 mg by mouth as needed.) 90 tablet 1   Cholecalciferol (VITAMIN D3) 2000 UNITS TABS Take 1 tablet by mouth daily. 5000 IU daily     clobetasol ointment (TEMOVATE) 0.05 % Apply to affected areas on external vagina nightly as needed for lichen sclerosis 45 g prn   escitalopram (LEXAPRO) 10 MG tablet TAKE 1 TABLET EVERY DAY 90 tablet 2   famotidine (PEPCID) 20 MG tablet Take 1 tablet (20 mg total) by mouth 2 (two) times daily.     fluticasone (FLONASE) 50 MCG/ACT nasal spray Place 2 sprays into both nostrils daily. (Patient taking differently: Place 2 sprays into both nostrils as needed.) 16 g 6   levothyroxine (SYNTHROID) 75 MCG tablet Take 1 tablet (75 mcg total) by mouth daily. 90 tablet 3   Current Facility-Administered Medications  Medication Dose Route Frequency Provider Last Rate Last Admin   0.9 %  sodium  chloride infusion  500 mL Intravenous Once Milus Banister, MD        Allergies as of 07/17/2021   (No Known Allergies)    Family History  Problem Relation Age of Onset   Colon polyps Mother    Glaucoma Mother    Cerebral aneurysm Mother    Macular degeneration Mother    Stroke Father    Hypertension Father    Lung cancer Father    Brain cancer Brother    Heart attack Maternal Uncle    Heart attack Paternal Uncle    Colon cancer Neg Hx    Stomach cancer Neg Hx    Esophageal cancer Neg Hx    Rectal cancer Neg Hx     Social History   Socioeconomic History   Marital status: Married    Spouse name: Timmothy Sours    Number of children: 1   Years of education: 12   Highest education level: High school graduate  Occupational History   Occupation: Conservation officer, historic buildings: [place]  Tobacco Use   Smoking status: Never   Smokeless tobacco: Never  Vaping Use   Vaping Use: Never used  Substance and Sexual Activity   Alcohol use: No  Drug use: No   Sexual activity: Yes    Partners: Male  Other Topics Concern   Not on file  Social History Narrative   Patient is married.  Her husband recently retired last March and they have been trying to travel more.   She has a daughter who lives locally.  She has grandchildren.  She is active in her church and tries to stay physically active.   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  She has been retired for a couple of years and is enjoying retirement quite a IT trainer: Low Risk    Difficulty of Paying Living Expenses: Not hard at all  Food Insecurity: No Food Insecurity   Worried About Charity fundraiser in the Last Year: Never true   Arboriculturist in the Last Year: Never true  Transportation Needs: No Transportation Needs   Lack of Transportation (Medical): No   Lack of Transportation (Non-Medical): No  Physical  Activity: Insufficiently Active   Days of Exercise per Week: 3 days   Minutes of Exercise per Session: 30 min  Stress: No Stress Concern Present   Feeling of Stress : Not at all  Social Connections: Socially Integrated   Frequency of Communication with Friends and Family: More than three times a week   Frequency of Social Gatherings with Friends and Family: More than three times a week   Attends Religious Services: More than 4 times per year   Active Member of [employer] or Organizations: Yes   Attends Archivist Meetings: 1 to 4 times per year   Marital Status: Married  Human resources officer Violence: Not At Risk   Fear of Current or Ex-Partner: No   Emotionally Abused: No   Physically Abused: No   Sexually Abused: No     Physical Exam:  Constitutional: generally well-appearing Psychiatric: alert and oriented x3 Lungs: CTA bilaterally Heart: no MCR  Assessment and plan: 70 y.o. female with routine risk for crc  Colonsocolyp today  Care is appropriate for the ambulatory setting.  Owens Loffler, MD [facility] 07/17/2021, 9:36 AM

## 2021-07-17 NOTE — Progress Notes (Signed)
VS by DT    

## 2021-07-17 NOTE — Patient Instructions (Signed)
Thank you for allowing [place] to care for you today! Resume previous diet and medication today. Return to your normal daily activities tomorrow. Biopsy results will be final in 7-10 days.  Will make recommendation at that time for a future colonoscopy.     YOU HAD AN ENDOSCOPIC PROCEDURE TODAY AT [facility]:   Refer to the procedure report that was given to you for any specific questions about what was found during the examination.  If the procedure report does not answer your questions, please call your gastroenterologist to clarify.  If you requested that your care partner not be given the details of your procedure findings, then the procedure report has been included in a sealed envelope for you to review at your convenience later.  YOU SHOULD EXPECT: Some feelings of bloating in the abdomen. Passage of more gas than usual.  Walking can help get rid of the air that was put into your GI tract during the procedure and reduce the bloating. If you had a lower endoscopy (such as a colonoscopy or flexible sigmoidoscopy) you may notice spotting of blood in your stool or on the toilet paper. If you underwent a bowel prep for your procedure, you may not have a normal bowel movement for a few days.  Please Note:  You might notice some irritation and congestion in your nose or some drainage.  This is from the oxygen used during your procedure.  There is no need for concern and it should clear up in a day or so.  SYMPTOMS TO REPORT IMMEDIATELY:  Following lower endoscopy (colonoscopy or flexible sigmoidoscopy):  Excessive amounts of blood in the stool  Significant tenderness or worsening of abdominal pains  Swelling of the abdomen that is new, acute  Fever of 100F or higher    For urgent or emergent issues, a gastroenterologist can be reached at any hour by calling [phone]. Do not use MyChart messaging for urgent concerns.    DIET:  We do recommend a small meal at first, but  then you may proceed to your regular diet.  Drink plenty of fluids but you should avoid alcoholic beverages for 24 hours.  ACTIVITY:  You should plan to take it easy for the rest of today and you should NOT DRIVE or use heavy machinery until tomorrow (because of the sedation medicines used during the test).    FOLLOW UP: Our staff will call the number listed on your records 48-72 hours following your procedure to check on you and address any questions or concerns that you may have regarding the information given to you following your procedure. If we do not reach you, we will leave a message.  We will attempt to reach you two times.  During this call, we will ask if you have developed any symptoms of COVID 19. If you develop any symptoms (ie: fever, flu-like symptoms, shortness of breath, cough etc.) before then, please call [phone].  If you test positive for Covid 19 in the 2 weeks post procedure, please call and report this information to [place].    If any biopsies were taken you will be contacted by phone or by letter within the next 1-3 weeks.  Please call us at [phone] if you have not heard about the biopsies in 3 weeks.    SIGNATURES/CONFIDENTIALITY: You and/or your care partner have signed paperwork which will be entered into your electronic medical record.  These signatures attest to the fact that that the information  above on your After Visit Summary has been reviewed and is understood.  Full responsibility of the confidentiality of this discharge information lies with you and/or your care-partner.  

## 2021-07-17 NOTE — Progress Notes (Signed)
Report given to PACU, vss 

## 2021-07-17 NOTE — Op Note (Signed)
[facility] Patient Name: [name] [name] Procedure Date: 07/17/2021 9:31 AM MRN: [id number] Endoscopist: Milus Banister , MD Age: 70 Referring MD:  Date of Birth: [birth year] Gender: Female Account #: [id number] Procedure:                Colonoscopy Indications:              Screening for colorectal malignant neoplasm;                            Colonoscoyp 2012 Dr. Deatra Ina, one subCM HP polyp                            removed Medicines:                Monitored Anesthesia Care Procedure:                Pre-Anesthesia Assessment:                           - Prior to the procedure, a History and Physical                            was performed, and patient medications and                            allergies were reviewed. The patient's tolerance of                            previous anesthesia was also reviewed. The risks                            and benefits of the procedure and the sedation                            options and risks were discussed with the patient.                            All questions were answered, and informed consent                            was obtained. Prior Anticoagulants: The patient has                            taken no previous anticoagulant or antiplatelet                            agents. ASA Grade Assessment: II - A patient with                            mild systemic disease. After reviewing the risks                            and benefits, the patient was deemed in  satisfactory condition to undergo the procedure.                           After obtaining informed consent, the colonoscope                            was passed under direct vision. Throughout the                            procedure, the patient's blood pressure, pulse, and                            oxygen saturations were monitored continuously. The                            CF [id number] #[id number] was introduced through the anus                             and advanced to the the cecum, identified by                            appendiceal orifice and ileocecal valve. The                            colonoscopy was performed without difficulty. The                            patient tolerated the procedure well. The quality                            of the bowel preparation was good. The ileocecal                            valve, appendiceal orifice, and rectum were                            photographed. Scope In: 9:57:40 AM Scope Out: 10:09:51 AM Scope Withdrawal Time: 0 hours 7 minutes 1 second  Total Procedure Duration: 0 hours 12 minutes 11 seconds  Findings:                 Three sessile polyps were found in the transverse                            colon and ascending colon. The polyps were 3 to 4                            mm in size. These polyps were removed with a cold                            snare. Resection and retrieval were complete.                           Internal hemorrhoids were found. The hemorrhoids  were small.                           The exam was otherwise without abnormality on                            direct and retroflexion views. Complications:            No immediate complications. Estimated blood loss:                            None. Estimated Blood Loss:     Estimated blood loss: none. Impression:               - Three 3 to 4 mm polyps in the transverse colon                            and in the ascending colon, removed with a cold                            snare. Resected and retrieved.                           - Internal hemorrhoids.                           - The examination was otherwise normal on direct                            and retroflexion views. Recommendation:           - Patient has a contact number available for                            emergencies. The signs and symptoms of potential                            delayed  complications were discussed with the                            patient. Return to normal activities tomorrow.                            Written discharge instructions were provided to the                            patient.                           - Resume previous diet.                           - Continue present medications.                           - Await pathology results. Milus Banister, MD 07/17/2021 10:13:34 AM This report has been signed electronically.

## 2021-07-17 NOTE — Progress Notes (Signed)
Called to room to assist during endoscopic procedure.  Patient ID and intended procedure confirmed with present staff. Received instructions for my participation in the procedure from the performing physician.  

## 2021-07-18 ENCOUNTER — Encounter: Payer: Medicare PPO | Admitting: Gastroenterology

## 2021-07-19 ENCOUNTER — Telehealth: Payer: Self-pay | Admitting: *Deleted

## 2021-07-19 ENCOUNTER — Telehealth: Payer: Self-pay

## 2021-07-19 NOTE — Telephone Encounter (Signed)
Called # [phone] and left a message we tried to reach pt for a follow up call. maw

## 2021-07-19 NOTE — Telephone Encounter (Signed)
  Follow up Call-  Call back number 07/17/2021  Post procedure Call Back phone  # [phone]  Permission to leave phone message Yes  Some recent data might be hidden     Patient questions:  Do you have a fever, pain , or abdominal swelling? No. Pain Score  0 *  Have you tolerated food without any problems? Yes.    Have you been able to return to your normal activities? Yes.    Do you have any questions about your discharge instructions: Diet   No. Medications  No. Follow up visit  No.  Do you have questions or concerns about your Care? No.  Actions: * If pain score is 4 or above: No action needed, pain <4.    Have you developed a fever since your procedure? no  2.   Have you had an respiratory symptoms (SOB or cough) since your procedure? no  3.   Have you tested positive for COVID 19 since your procedure no  4.   Have you had any family members/close contacts diagnosed with the COVID 19 since your procedure?  no   If yes to any of these questions please route to Joylene John, RN and Joella Prince, RN

## 2021-07-21 ENCOUNTER — Encounter: Payer: Self-pay | Admitting: Gastroenterology

## 2021-07-23 ENCOUNTER — Ambulatory Visit: Payer: Medicare PPO | Admitting: Orthopaedic Surgery

## 2021-07-23 ENCOUNTER — Encounter: Payer: Self-pay | Admitting: Orthopaedic Surgery

## 2021-07-23 ENCOUNTER — Other Ambulatory Visit: Payer: Self-pay

## 2021-07-23 DIAGNOSIS — M1612 Unilateral primary osteoarthritis, left hip: Secondary | ICD-10-CM

## 2021-07-23 NOTE — Progress Notes (Signed)
Office Visit Note   Patient: [name] [name]           Date of Birth: January 05, 1951           MRN: [id number] Visit Date: 07/23/2021              Requested by: [name] Norlander, DO [place],  [place] [address] PCP: [name] Norlander, DO   Assessment & Plan: Visit Diagnoses:  1. Primary osteoarthritis of left hip     Plan: Impression is mild left hip OA.  The pain is currently only related to increased activity.  X-rays performed in August show mild OA.  I have recommended treating with NSAIDs prior to activity and as needed for now.  If her symptoms worsen could consider intra-articular cortisone injection.  Questions encouraged and answered.  Follow-Up Instructions: No follow-ups on file.   Orders:  No orders of the defined types were placed in this encounter.  No orders of the defined types were placed in this encounter.     Procedures: No procedures performed   Clinical Data: No additional findings.   Subjective: Chief Complaint  Patient presents with   Left Hip - Pain    [name] [name] is a very pleasant 70 year old female whose husband [name] [name] I took care of last year comes in for evaluation of chronic left hip and groin pain since April.  Denies any injuries or trauma or surgeries.  She has no pain when sitting.  She only has pain when she has been walking a lot.  The pain is not to a level that has required medications.  Occasionally she has had weakness and pain when she she lifts her leg up into the car.  Feels occasional locking sensation in the hip joint.   Review of Systems  Constitutional: Negative.   HENT: Negative.    Eyes: Negative.   Respiratory: Negative.    Cardiovascular: Negative.   Endocrine: Negative.   Musculoskeletal: Negative.   Neurological: Negative.   Hematological: Negative.   Psychiatric/Behavioral: Negative.    All other systems reviewed and are negative.   Objective: Vital Signs: There were no vitals taken for this  visit.  Physical Exam Vitals and nursing note reviewed.  Constitutional:      Appearance: She is well-developed.  HENT:     Head: Normocephalic and atraumatic.  Pulmonary:     Effort: Pulmonary effort is normal.  Abdominal:     Palpations: Abdomen is soft.  Musculoskeletal:     Cervical back: Neck supple.  Skin:    General: Skin is warm.     Capillary Refill: Capillary refill takes less than 2 seconds.  Neurological:     Mental Status: She is alert and oriented to person, place, and time.  Psychiatric:        Behavior: Behavior normal.        Thought Content: Thought content normal.        Judgment: Judgment normal.    Ortho Exam  Left hip joint shows preservation of range of motion without any pain.  No tenderness palpation.  Negative Stinchfield, negative FADIR, negative logroll, negative sciatic tension signs.  Specialty Comments:  No specialty comments available.  Imaging: No results found.   PMFS History: Patient Active Problem List   Diagnosis Date Noted   Subacute frontal sinusitis 01/14/2021   Postmenopausal 11/22/2019   Vitamin D deficiency 11/22/2019   Depression, recurrent ([facility]) 11/22/2019   SVT (supraventricular tachycardia) ([facility]) 05/03/2019   Upper  respiratory tract infection    Syncope 08/22/2018   Precordial chest pain 08/22/2018   Chest pain in adult    Nausea and vomiting in adult    Sore throat 06/16/2018   Healthcare maintenance 10/27/2016   Insomnia 10/02/2015   Low serum vitamin D 08/16/2014   Hypothyroidism (acquired) 02/03/2013   Hyperlipidemia 02/03/2013   Osteopenia of multiple sites 02/03/2013   Rosacea 02/03/2013   HSV-1 (herpes simplex virus 1) infection 02/03/2013   Past Medical History:  Diagnosis Date   Allergy    Anxiety    Arthritis    mild hips - left worse   BCC (basal cell carcinoma of skin) 07/31/2016   Superficial Bcc left shoulder   Cataract    forming   GERD (gastroesophageal reflux disease)    History of  cold sores    Hyperlipidemia    Osteopenia    Rosacea    SVT (supraventricular tachycardia) (HCC)    past hx - no issues   Thyroid disease    hypothyroid    Family History  Problem Relation Age of Onset   Colon polyps Mother    Glaucoma Mother    Cerebral aneurysm Mother    Macular degeneration Mother    Stroke Father    Hypertension Father    Lung cancer Father    Brain cancer Brother    Heart attack Maternal Uncle    Heart attack Paternal Uncle    Colon cancer Neg Hx    Stomach cancer Neg Hx    Esophageal cancer Neg Hx    Rectal cancer Neg Hx     Past Surgical History:  Procedure Laterality Date   COLONOSCOPY     07-21-2011   skin cancer removal     tiny per pt   TUBAL LIGATION     WISDOM TOOTH EXTRACTION     Social History   Occupational History   Occupation: Conservation officer, historic buildings: [place]  Tobacco Use   Smoking status: Never   Smokeless tobacco: Never  Vaping Use   Vaping Use: Never used  Substance and Sexual Activity   Alcohol use: No   Drug use: No   Sexual activity: Yes    Partners: Male

## 2021-09-02 ENCOUNTER — Other Ambulatory Visit: Payer: Self-pay

## 2021-09-02 ENCOUNTER — Encounter (INDEPENDENT_AMBULATORY_CARE_PROVIDER_SITE_OTHER): Payer: Medicare PPO | Admitting: Ophthalmology

## 2021-09-02 DIAGNOSIS — H33303 Unspecified retinal break, bilateral: Secondary | ICD-10-CM

## 2021-09-02 DIAGNOSIS — H43813 Vitreous degeneration, bilateral: Secondary | ICD-10-CM

## 2021-09-02 DIAGNOSIS — H35372 Puckering of macula, left eye: Secondary | ICD-10-CM

## 2021-11-25 ENCOUNTER — Encounter: Payer: Self-pay | Admitting: Family Medicine

## 2021-11-25 ENCOUNTER — Ambulatory Visit: Payer: Medicare PPO | Admitting: Family Medicine

## 2021-11-25 VITALS — BP 122/80 | HR 89 | Temp 98.2°F | Ht 65.0 in | Wt 141.6 lb

## 2021-11-25 DIAGNOSIS — M8589 Other specified disorders of bone density and structure, multiple sites: Secondary | ICD-10-CM | POA: Diagnosis not present

## 2021-11-25 DIAGNOSIS — S6990XA Unspecified injury of unspecified wrist, hand and finger(s), initial encounter: Secondary | ICD-10-CM

## 2021-11-25 DIAGNOSIS — S62647A Nondisplaced fracture of proximal phalanx of left little finger, initial encounter for closed fracture: Secondary | ICD-10-CM

## 2021-11-25 DIAGNOSIS — E039 Hypothyroidism, unspecified: Secondary | ICD-10-CM | POA: Diagnosis not present

## 2021-11-25 NOTE — Progress Notes (Signed)
Subjective: [id number] PCP: Janora Norlander, DO [id number] [name] [name] is a 71 y.o. female presenting to clinic today for:  1. Hypothyroidism Denies any heart palpitations, change in voice, change in bowel habits.  Reports some mild low energy but admits that she is often very busy.  She has good sleep.  No anxiety or insomnia.  Compliant with Synthroid  2.  Finger injury Patient reports that last Thursday she was vacuuming and felt a popping pain in the left pinky finger.  She subsequently had moderate bruising and swelling.  She notes that this is getting slightly better but she still has quite a bit of swelling and this worries her.  She is able to flex that left finger but does have some difficulty due to the swelling.  Been using ibuprofen.  Has history of osteopenia.   ROS: Per HPI  No Known Allergies Past Medical History:  Diagnosis Date   Allergy    Anxiety    Arthritis    mild hips - left worse   BCC (basal cell carcinoma of skin) 07/31/2016   Superficial Bcc left shoulder   Cataract    forming   GERD (gastroesophageal reflux disease)    History of cold sores    Hyperlipidemia    Osteopenia    Rosacea    SVT (supraventricular tachycardia) (HCC)    past hx - no issues   Thyroid disease    hypothyroid    Current Outpatient Medications:    atorvastatin (LIPITOR) 40 MG tablet, TAKE 1 TABLET EVERY DAY, Disp: 90 tablet, Rfl: 2   Calcium Carbonate-Vitamin D (CALCIUM 500/D PO), Take by mouth., Disp: , Rfl:    cetirizine (ZYRTEC) 10 MG tablet, Take 1 tablet (10 mg total) by mouth daily. (Patient not taking: Reported on 07/17/2021), Disp: 90 tablet, Rfl: 1   Cholecalciferol (VITAMIN D3) 2000 UNITS TABS, Take 1 tablet by mouth daily. 5000 IU daily, Disp: , Rfl:    clobetasol ointment (TEMOVATE) 0.05 %, Apply to affected areas on external vagina nightly as needed for lichen sclerosis, Disp: 45 g, Rfl: prn   escitalopram (LEXAPRO) 10 MG tablet, TAKE 1 TABLET EVERY DAY,  Disp: 90 tablet, Rfl: 2   famotidine (PEPCID) 20 MG tablet, Take 1 tablet (20 mg total) by mouth 2 (two) times daily., Disp:  , Rfl:    fluticasone (FLONASE) 50 MCG/ACT nasal spray, Place 2 sprays into both nostrils daily. (Patient taking differently: Place 2 sprays into both nostrils as needed.), Disp: 16 g, Rfl: 6   levothyroxine (SYNTHROID) 75 MCG tablet, Take 1 tablet (75 mcg total) by mouth daily., Disp: 90 tablet, Rfl: 3 Social History   Socioeconomic History   Marital status: Married    Spouse name: Timmothy Sours    Number of children: 1   Years of education: 12   Highest education level: High school graduate  Occupational History   Occupation: Conservation officer, historic buildings: [employer]  Tobacco Use   Smoking status: Never   Smokeless tobacco: Never  Vaping Use   Vaping Use: Never used  Substance and Sexual Activity   Alcohol use: No   Drug use: No   Sexual activity: Yes    Partners: Male  Other Topics Concern   Not on file  Social History Narrative   Patient is married.  Her husband recently retired last March and they have been trying to travel more.   She has a daughter who lives locally.  She has grandchildren.  She is  active in her church and tries to stay physically active.   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  She has been retired for a couple of years and is enjoying retirement quite a IT trainer: Low Risk    Difficulty of Paying Living Expenses: Not hard at all  Food Insecurity: No Food Insecurity   Worried About Charity fundraiser in the Last Year: Never true   Arboriculturist in the Last Year: Never true  Transportation Needs: No Transportation Needs   Lack of Transportation (Medical): No   Lack of Transportation (Non-Medical): No  Physical Activity: Insufficiently Active   Days of Exercise per Week: 3 days   Minutes of Exercise per Session: 30 min   Stress: No Stress Concern Present   Feeling of Stress : Not at all  Social Connections: Socially Integrated   Frequency of Communication with Friends and Family: More than three times a week   Frequency of Social Gatherings with Friends and Family: More than three times a week   Attends Religious Services: More than 4 times per year   Active Member of [employer] or Organizations: Yes   Attends Archivist Meetings: 1 to 4 times per year   Marital Status: Married  Human resources officer Violence: Not At Risk   Fear of Current or Ex-Partner: No   Emotionally Abused: No   Physically Abused: No   Sexually Abused: No   Family History  Problem Relation Age of Onset   Colon polyps Mother    Glaucoma Mother    Cerebral aneurysm Mother    Macular degeneration Mother    Stroke Father    Hypertension Father    Lung cancer Father    Brain cancer Brother    Heart attack Maternal Uncle    Heart attack Paternal Uncle    Colon cancer Neg Hx    Stomach cancer Neg Hx    Esophageal cancer Neg Hx    Rectal cancer Neg Hx     Objective: Office vital signs reviewed. BP 122/80    Pulse 89    Temp 98.2 F (36.8 C)    Ht 5\' 5"  (1.651 m)    Wt 141 lb 9.6 oz (64.2 kg)    SpO2 96%    BMI 23.56 kg/m   Physical Examination:  General: Awake, alert, well nourished, No acute distress HEENT: No goiter.  No exophthalmos Cardio: regular rate and rhythm, S1S2 heard, no murmurs appreciated Pulm: clear to auscultation bilaterally, no wheezes, rhonchi or rales; normal work of breathing on room air MSK: Left ankle with moderate soft tissue swelling.  Able to flex left pinky but with some difficulty secondary to swelling; no palpable bony abnormalities Skin: Mild to moderate ecchymosis noted along the left pinky Neuro: No tremor  Assessment/ Plan: 71 y.o. female   Injury of little finger - Plan: DG Finger Little Left  Osteopenia of multiple sites - Plan: DG [facility] DEXA  Hypothyroidism (acquired) - Plan:  TSH, T4, Free  I question possible occult fracture given known osteopenia.  Will evaluate further with x-ray, which was not available today so we will obtain tomorrow.  Her hand was buddy taped today.  Her physical exam was remarkable for moderate soft tissue swelling and ecchymosis noted of the left pinky.  She had reasonable range of motion and was almost able to  make a full fist with that left pinky  DEXA scan ordered as well.  We will obtain that at a future date  Asymptomatic from a thyroid standpoint Check thyroid levels  No orders of the defined types were placed in this encounter.  No orders of the defined types were placed in this encounter.    Janora Norlander, DO [place] [phone]

## 2021-11-25 NOTE — Patient Instructions (Signed)
How to Buddy Tape Buddy taping refers to taping an injured finger or toe to an uninjured finger or toe that is next to it. This protects the injured finger or toe and reduces movement while the injury heals. You may buddy tape a finger or toe if you have a minor sprain. Your health care provider may buddy tape your finger or toe if you have a sprain, dislocation, or fracture. You may be told to replace your buddy taping as needed. What are the risks? Generally, buddy taping is safe. However, problems may occur, such as: Skin injury or infection. Reduced blood flow to the finger or toe. Skin reaction to the tape. Do not buddy tape your toe if you have diabetes. Do not buddy tape if you know that you have an allergy to adhesives or surgical tape. Supplies needed: Gauze pad, cotton, or cloth. Tape. This may be called first-aid tape, surgical tape, or medical tape. How to buddy tape Before buddy taping Lessen any pain and swelling with rest, icing, and elevation: Avoid activities that cause pain. If directed, put ice on the injured area. To do this: Put ice in a plastic bag. Place a towel between your skin and the bag. Leave the ice on for 20 minutes, 2-3 times a day. Remove the ice if your skin turns bright red. This is very important. If you cannot feel pain, heat, or cold, you have a greater risk of damage to the area. Raise (elevate) your hand or foot above the level of your heart while you are sitting or lying down.  Buddy taping procedure  Clean and dry your finger or toe as told by your health care provider. Place a gauze pad or a piece of cloth or cotton between your injured finger or toe and the uninjured finger or toe. Use tape to wrap around both fingers or toes so your injured finger or toe is secured to the uninjured finger or toe. The tape should be snug, but not tight. Make sure the ends of the piece of tape overlap. Avoid placing tape directly over the joint. Change the  tape and the padding as told by your health care provider. Remove and replace the tape or padding if it becomes loose, worn, dirty, or wet. After buddy taping Watch the buddy-taped area and always remove buddy taping if: Your pain gets worse. Your fingers or toes turn pale or blue. Your skin becomes irritated. Follow these instructions at home: Take over-the-counter and prescription medicines only as told by your health care provider. Return to your normal activities as told by your health care provider. Ask your health care provider what activities are safe for you. Contact a health care provider if: You have pain, swelling, or bruising that lasts longer than 3 days. You have a fever. Your skin is red, cracked, or irritated. Get help right away if: The injured area becomes cold, numb, or pale. You have severe pain, swelling, bruising, or loss of movement in your finger or toe. Your finger or toe changes shape (deformity). Summary Buddy taping refers to taping an injured finger or toe to an uninjured finger or toe that is next to it. You may buddy tape a finger or toe if you have a minor sprain or fracture. Take over-the-counter and prescription medicines only as told by your health care provider. This information is not intended to replace advice given to you by your health care provider. Make sure you discuss any questions you have with your  health care provider. Document Revised: 07/19/2020 Document Reviewed: 07/19/2020 Elsevier Patient Education  2022 [employer].

## 2021-11-26 ENCOUNTER — Other Ambulatory Visit: Payer: Self-pay

## 2021-11-26 ENCOUNTER — Ambulatory Visit (INDEPENDENT_AMBULATORY_CARE_PROVIDER_SITE_OTHER): Payer: Medicare PPO

## 2021-11-26 DIAGNOSIS — S6990XA Unspecified injury of unspecified wrist, hand and finger(s), initial encounter: Secondary | ICD-10-CM

## 2021-11-26 DIAGNOSIS — E039 Hypothyroidism, unspecified: Secondary | ICD-10-CM

## 2021-11-26 DIAGNOSIS — S60947A Unspecified superficial injury of left little finger, initial encounter: Secondary | ICD-10-CM | POA: Diagnosis not present

## 2021-11-26 DIAGNOSIS — S92512A Displaced fracture of proximal phalanx of left lesser toe(s), initial encounter for closed fracture: Secondary | ICD-10-CM | POA: Diagnosis not present

## 2021-11-26 LAB — T4, FREE: Free T4: 1.77 ng/dL (ref 0.82–1.77)

## 2021-11-26 LAB — TSH: TSH: 0.386 u[IU]/mL — ABNORMAL LOW (ref 0.450–4.500)

## 2021-11-26 NOTE — Addendum Note (Signed)
Addended by: Janora Norlander on: 11/26/2021 02:13 PM   Modules accepted: Orders

## 2021-11-28 ENCOUNTER — Encounter: Payer: Self-pay | Admitting: Orthopedic Surgery

## 2021-11-28 ENCOUNTER — Ambulatory Visit: Payer: Medicare PPO | Admitting: Orthopedic Surgery

## 2021-11-28 ENCOUNTER — Ambulatory Visit: Payer: Self-pay

## 2021-11-28 ENCOUNTER — Other Ambulatory Visit: Payer: Self-pay

## 2021-11-28 VITALS — BP 129/70 | HR 79 | Ht 65.0 in | Wt 140.8 lb

## 2021-11-28 DIAGNOSIS — M79645 Pain in left finger(s): Secondary | ICD-10-CM | POA: Diagnosis not present

## 2021-11-28 DIAGNOSIS — S62619A Displaced fracture of proximal phalanx of unspecified finger, initial encounter for closed fracture: Secondary | ICD-10-CM | POA: Insufficient documentation

## 2021-11-28 DIAGNOSIS — S62617A Displaced fracture of proximal phalanx of left little finger, initial encounter for closed fracture: Secondary | ICD-10-CM | POA: Diagnosis not present

## 2021-11-28 NOTE — Progress Notes (Signed)
Office Visit Note   Patient: [name] [name]           Date of Birth: [birth year]           MRN: [id number] Visit Date: 11/28/2021              Requested by: Janora Norlander, DO [place],  [place] [id number] PCP: Janora Norlander, DO   Assessment & Plan: Visit Diagnoses:  1. Finger pain, left   2. Closed displaced fracture of proximal phalanx of left little finger, initial encounter     Plan: Reviewed the x-rays with the patient which demonstrate a oblique extra-articular fracture through the small finger proximal phalanx with some fracture shortening.  Discussed risks and benefits of both operative and nonoperative treatment options with the patient.  She has no malrotation of this finger and has near full range of motion at this point.  After our discussion, the patient wants to proceed with nonsurgical management and is not interested in surgical fixatoin.  We will try buddy strapping the little finger to the ring finger.  I will see her back next week to make sure that she remains well aligned and does not develop any malrotation.  Follow-Up Instructions: No follow-ups on file.   Orders:  Orders Placed This Encounter  Procedures   XR Finger Little Left   No orders of the defined types were placed in this encounter.     Procedures: Splinting  Date/Time: 11/28/2021 4:10 PM Performed by: Sherilyn Cooter, MD Authorized by: Sherilyn Cooter, MD   Consent Given by:  Patient Location:  Finger  left little finger Fracture Type: proximal phalanx   MCP Joint Involved?: No   Any IP Joint Involved?: No   Neurovascularly intact   Distal Perfusion: normal   Distal Sensation: normal   Manipulation Performed?: No   Immobilization:  Brace Is this the patient's first splint for this injury?: No   Splint/Brace Type:  Wrist/thumb velcro (Buddy strap) Supplies used: Buddy strap. Neurovascularly intact   Distal Perfusion: normal   Distal Sensation: normal   Patient  tolerance:  Patient tolerated the procedure well with no immediate complications   Clinical Data: No additional findings.   Subjective: Chief Complaint  Patient presents with   Left Little Finger - New Patient (Initial Visit)    This is a 71 year old right-hand-dominant female who presents with injury to the left small finger.  This occurred last Thursday.  She had finished vacuuming and was wrapping a cord around the cleaner.  She is unsure what happened next but thinks that the cord wrapped around her small finger.  She felt a pop and noticed immediate pain in the small finger.  X-rays were obtained on 2/14 which demonstrated a long oblique fracture through the proximal phalanx of the small finger.  Her pain is much improved since the injury.  She was originally quite stiff but now notes that she has near full range of motion of this finger.  Her ecchymosis has gotten much better.  She was initially placed in a AlumaFoam splint but has since taken off.  She taken Advil as needed for pain.   Review of Systems   Objective: Vital Signs: BP 129/70 (BP Location: Right Arm, Patient Position: Sitting, Cuff Size: Normal)    Pulse 79    Ht 5\' 5"  (1.651 m)    Wt 140 lb 12.8 oz (63.9 kg)    SpO2 95%    BMI 23.43 kg/m  Physical Exam Constitutional:      Appearance: Normal appearance.  Cardiovascular:     Rate and Rhythm: Normal rate.     Pulses: Normal pulses.  Pulmonary:     Effort: Pulmonary effort is normal.  Skin:    General: Skin is warm.     Capillary Refill: Capillary refill takes less than 2 seconds.  Neurological:     Mental Status: She is alert.    Left Hand Exam   Tenderness  Left hand tenderness location: TTP along small finger proximal phalanx w/ moderate associated swelling and volar ecchymosis.   Other  Erythema: absent Sensation: normal Pulse: present  Comments:  Able to make near full composite fist but lacks approx 1-2 cm from palm with small finger. No  malrotation or pseudoclawing of the digit.  No angulation or malalignment of small finger.      Specialty Comments:  No specialty comments available.  Imaging: No results found.   PMFS History: Patient Active Problem List   Diagnosis Date Noted   Proximal phalanx fracture of finger 11/28/2021   Subacute frontal sinusitis 01/14/2021   Postmenopausal 11/22/2019   Vitamin D deficiency 11/22/2019   Depression, recurrent ([place]) 11/22/2019   SVT (supraventricular tachycardia) ([place]) 05/03/2019   Upper respiratory tract infection    Syncope 08/22/2018   Precordial chest pain 08/22/2018   Chest pain in adult    Nausea and vomiting in adult    Sore throat 06/16/2018   Healthcare maintenance 10/27/2016   Insomnia 10/02/2015   Low serum vitamin D 08/16/2014   Hypothyroidism (acquired) 02/03/2013   Hyperlipidemia 02/03/2013   Osteopenia of multiple sites 02/03/2013   Rosacea 02/03/2013   HSV-1 (herpes simplex virus 1) infection 02/03/2013   Past Medical History:  Diagnosis Date   Allergy    Anxiety    Arthritis    mild hips - left worse   BCC (basal cell carcinoma of skin) 07/31/2016   Superficial Bcc left shoulder   Cataract    forming   GERD (gastroesophageal reflux disease)    History of cold sores    Hyperlipidemia    Osteopenia    Rosacea    SVT (supraventricular tachycardia) (HCC)    past hx - no issues   Thyroid disease    hypothyroid    Family History  Problem Relation Age of Onset   Colon polyps Mother    Glaucoma Mother    Cerebral aneurysm Mother    Macular degeneration Mother    Stroke Father    Hypertension Father    Lung cancer Father    Brain cancer Brother    Heart attack Maternal Uncle    Heart attack Paternal Uncle    Colon cancer Neg Hx    Stomach cancer Neg Hx    Esophageal cancer Neg Hx    Rectal cancer Neg Hx     Past Surgical History:  Procedure Laterality Date   COLONOSCOPY     07-21-2011   skin cancer removal     tiny per pt    TUBAL LIGATION     WISDOM TOOTH EXTRACTION     Social History   Occupational History   Occupation: Conservation officer, historic buildings: [employer]  Tobacco Use   Smoking status: Never   Smokeless tobacco: Never  Vaping Use   Vaping Use: Never used  Substance and Sexual Activity   Alcohol use: No   Drug use: No   Sexual activity: Yes    Partners: Male

## 2021-12-05 ENCOUNTER — Ambulatory Visit: Payer: Medicare PPO | Admitting: Orthopedic Surgery

## 2021-12-11 ENCOUNTER — Ambulatory Visit (INDEPENDENT_AMBULATORY_CARE_PROVIDER_SITE_OTHER): Payer: Medicare PPO

## 2021-12-11 DIAGNOSIS — M8588 Other specified disorders of bone density and structure, other site: Secondary | ICD-10-CM

## 2021-12-11 DIAGNOSIS — M8589 Other specified disorders of bone density and structure, multiple sites: Secondary | ICD-10-CM

## 2021-12-12 ENCOUNTER — Other Ambulatory Visit: Payer: Self-pay | Admitting: Family Medicine

## 2021-12-12 DIAGNOSIS — M8589 Other specified disorders of bone density and structure, multiple sites: Secondary | ICD-10-CM | POA: Diagnosis not present

## 2021-12-12 DIAGNOSIS — E7841 Elevated Lipoprotein(a): Secondary | ICD-10-CM

## 2021-12-12 DIAGNOSIS — Z780 Asymptomatic menopausal state: Secondary | ICD-10-CM | POA: Diagnosis not present

## 2022-01-08 ENCOUNTER — Other Ambulatory Visit: Payer: Medicare PPO

## 2022-01-08 DIAGNOSIS — E039 Hypothyroidism, unspecified: Secondary | ICD-10-CM

## 2022-01-09 ENCOUNTER — Other Ambulatory Visit: Payer: Self-pay | Admitting: Family Medicine

## 2022-01-09 LAB — TSH: TSH: 1.01 u[IU]/mL (ref 0.450–4.500)

## 2022-01-09 LAB — T4, FREE: Free T4: 1.67 ng/dL (ref 0.82–1.77)

## 2022-01-09 MED ORDER — LEVOTHYROXINE SODIUM 75 MCG PO TABS: 75.0000 ug | ORAL_TABLET | Freq: Every day | ORAL | 3 refills | Status: DC

## 2022-02-03 ENCOUNTER — Other Ambulatory Visit: Payer: Self-pay | Admitting: Family Medicine

## 2022-02-03 DIAGNOSIS — F339 Major depressive disorder, recurrent, unspecified: Secondary | ICD-10-CM

## 2022-02-03 DIAGNOSIS — F411 Generalized anxiety disorder: Secondary | ICD-10-CM

## 2022-03-12 ENCOUNTER — Other Ambulatory Visit: Payer: Self-pay | Admitting: Dermatology

## 2022-03-12 ENCOUNTER — Ambulatory Visit: Payer: Medicare PPO | Admitting: Dermatology

## 2022-03-12 DIAGNOSIS — L719 Rosacea, unspecified: Secondary | ICD-10-CM

## 2022-03-12 DIAGNOSIS — L820 Inflamed seborrheic keratosis: Secondary | ICD-10-CM

## 2022-03-12 DIAGNOSIS — Z85828 Personal history of other malignant neoplasm of skin: Secondary | ICD-10-CM | POA: Diagnosis not present

## 2022-03-12 MED ORDER — IVERMECTIN 1 % EX CREA: 1.0000 "application " | TOPICAL_CREAM | Freq: Every day | CUTANEOUS | 2 refills | Status: DC

## 2022-03-13 ENCOUNTER — Other Ambulatory Visit: Payer: Self-pay | Admitting: Dermatology

## 2022-03-13 DIAGNOSIS — L719 Rosacea, unspecified: Secondary | ICD-10-CM

## 2022-04-04 ENCOUNTER — Encounter: Payer: Self-pay | Admitting: Dermatology

## 2022-04-08 ENCOUNTER — Other Ambulatory Visit: Payer: Self-pay | Admitting: Family Medicine

## 2022-04-08 DIAGNOSIS — N904 Leukoplakia of vulva: Secondary | ICD-10-CM

## 2022-05-20 DIAGNOSIS — Z1231 Encounter for screening mammogram for malignant neoplasm of breast: Secondary | ICD-10-CM | POA: Diagnosis not present

## 2022-05-22 ENCOUNTER — Ambulatory Visit (INDEPENDENT_AMBULATORY_CARE_PROVIDER_SITE_OTHER): Payer: Medicare PPO

## 2022-05-22 VITALS — Wt 142.0 lb

## 2022-05-22 DIAGNOSIS — Z0000 Encounter for general adult medical examination without abnormal findings: Secondary | ICD-10-CM

## 2022-05-22 NOTE — Progress Notes (Signed)
Subjective:   [name] [name] is a 71 y.o. female who presents for Medicare Annual (Subsequent) preventive examination.  Virtual Visit via Telephone Note  I connected with  [name] [name] on 05/22/22 at  1:15 PM EDT by telephone and verified that I am speaking with the correct person using two identifiers.  Location: Patient: home Provider: [facility] Persons participating in the virtual visit: patient/Nurse Health Advisor   I discussed the limitations, risks, security and privacy concerns of performing an evaluation and management service by telephone and the availability of in person appointments. The patient expressed understanding and agreed to proceed.  Interactive audio and video telecommunications were attempted between this nurse and patient, however failed, due to patient having technical difficulties OR patient did not have access to video capability.  We continued and completed visit with audio only.  Some vital signs may be absent or patient reported.   Martine Trageser E Shakerria Parran, LPN   Review of Systems     Cardiac Risk Factors include: advanced age (>39mn, >>1women);sedentary lifestyle;dyslipidemia     Objective:    Today's Vitals   05/22/22 1555  Weight: 142 lb (64.4 kg)   Body mass index is 23.63 kg/m.     05/22/2022    4:02 PM 07/17/2021    9:44 AM 05/21/2021    1:45 PM 05/16/2020    2:33 PM 05/02/2019    2:21 PM 08/22/2018    9:15 PM 08/22/2018    9:53 AM  Advanced Directives  Does Patient Have a Medical Advance Directive? No No No No Yes  No  Type of Advance Directive     [place] Chart?     No - copy requested    Would patient like information on creating a medical advance directive? No - Patient declined No - Patient declined No - Patient declined No - Patient declined No - Patient declined No - Patient declined     Current Medications (verified) Outpatient Encounter Medications as of 05/22/2022   Medication Sig   atorvastatin (LIPITOR) 40 MG tablet TAKE 1 TABLET BY MOUTH EVERY DAY   Calcium Carbonate-Vitamin D (CALCIUM 500/D PO) Take by mouth.   Cholecalciferol (VITAMIN D3) 2000 UNITS TABS Take 1 tablet by mouth daily. 5000 IU daily   levothyroxine (SYNTHROID) 75 MCG tablet Take 1 tablet (75 mcg total) by mouth daily.   clobetasol ointment (TEMOVATE) 0.05 % APPLY TO AFFECTED AREAS ON EXTERNAL VAGINA NIGHTLY AS NEEDED FOR LICHEN SCLEROSIS (Patient not taking: Reported on 05/22/2022)   escitalopram (LEXAPRO) 10 MG tablet TAKE 1 TABLET BY MOUTH EVERY DAY (Patient not taking: Reported on 05/22/2022)   [DISCONTINUED] cetirizine (ZYRTEC) 10 MG tablet Take 1 tablet (10 mg total) by mouth daily. (Patient not taking: Reported on 05/22/2022)   [DISCONTINUED] famotidine (PEPCID) 20 MG tablet Take 1 tablet (20 mg total) by mouth 2 (two) times daily. (Patient not taking: Reported on 05/22/2022)   [DISCONTINUED] fluticasone (FLONASE) 50 MCG/ACT nasal spray Place 2 sprays into both nostrils daily. (Patient not taking: Reported on 05/22/2022)   [DISCONTINUED] METRONIDAZOLE, TOPICAL, 0.75 % LOTN Apply to affected area qd (Patient not taking: Reported on 05/22/2022)   No facility-administered encounter medications on file as of 05/22/2022.    Allergies (verified) Patient has no known allergies.   History: Past Medical History:  Diagnosis Date   Allergy    Anxiety    Arthritis    mild hips - left worse  BCC (basal cell carcinoma of skin) 07/31/2016   Superficial Bcc left shoulder   Cataract    forming   GERD (gastroesophageal reflux disease)    History of cold sores    Hyperlipidemia    Osteopenia    Rosacea    SVT (supraventricular tachycardia) (HCC)    past hx - no issues   Thyroid disease    hypothyroid   Past Surgical History:  Procedure Laterality Date   COLONOSCOPY     07-21-2011   skin cancer removal     tiny per pt   TUBAL LIGATION     WISDOM TOOTH EXTRACTION     Family  History  Problem Relation Age of Onset   Colon polyps Mother    Glaucoma Mother    Cerebral aneurysm Mother    Macular degeneration Mother    Stroke Father    Hypertension Father    Lung cancer Father    Brain cancer Brother    Heart attack Maternal Uncle    Heart attack Paternal Uncle    Colon cancer Neg Hx    Stomach cancer Neg Hx    Esophageal cancer Neg Hx    Rectal cancer Neg Hx    Social History   Socioeconomic History   Marital status: Married    Spouse name: Timmothy Sours    Number of children: 1   Years of education: 12   Highest education level: High school graduate  Occupational History   Occupation: Conservation officer, historic buildings: [employer]  Tobacco Use   Smoking status: Never   Smokeless tobacco: Never  Vaping Use   Vaping Use: Never used  Substance and Sexual Activity   Alcohol use: No   Drug use: No   Sexual activity: Yes    Partners: Male  Other Topics Concern   Not on file  Social History Narrative   Patient is married.  Her husband recently retired last March and they have been trying to travel more.   She has a daughter who lives locally.  She has grandchildren.  She is active in her church and tries to stay physically active.   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  She has been retired for a couple of years and is enjoying retirement quite a Holiday representative Strain: Low Risk  (05/22/2022)   Overall Financial Resource Strain (CARDIA)    Difficulty of Paying Living Expenses: Not hard at all  Food Insecurity: No Food Insecurity (05/22/2022)   Hunger Vital Sign    Worried About Running Out of Food in the Last Year: Never true    [place] in the Last Year: Never true  Transportation Needs: No Transportation Needs (05/22/2022)   PRAPARE - Hydrologist (Medical): No    Lack of Transportation (Non-Medical): No  Physical  Activity: Inactive (05/22/2022)   Exercise Vital Sign    Days of Exercise per Week: 0 days    Minutes of Exercise per Session: 0 min  Stress: No Stress Concern Present (05/22/2022)   [place]    Feeling of Stress : Only a little  Social Connections: Socially Integrated (05/22/2022)   Social Connection and Isolation Panel [NHANES]    Frequency of Communication with Friends and Family: More than three times a week  Frequency of Social Gatherings with Friends and Family: More than three times a week    Attends Religious Services: More than 4 times per year    Active Member of Clubs or Organizations: Yes    Attends Music therapist: More than 4 times per year    Marital Status: Married    Tobacco Counseling Counseling given: Not Answered   Clinical Intake:  Pre-visit preparation completed: Yes  Pain : No/denies pain     BMI - recorded: 23.63 Nutritional Status: BMI of 19-24  Normal Nutritional Risks: None Diabetes: No  How often do you need to have someone help you when you read instructions, pamphlets, or other written materials from your doctor or pharmacy?: 1 - Never  Diabetic? no  Interpreter Needed?: No  Information entered by :: Tamelia Michalowski, LPN   Activities of Daily Living    05/22/2022    4:01 PM  In your present state of health, do you have any difficulty performing the following activities:  Hearing? 0  Vision? 0  Difficulty concentrating or making decisions? 0  Walking or climbing stairs? 0  Dressing or bathing? 0  Doing errands, shopping? 0  Preparing Food and eating ? N  Using the Toilet? N  In the past six months, have you accidently leaked urine? Y  Comment wears light days pads  Do you have problems with loss of bowel control? N  Managing your Medications? N  Managing your Finances? N  Housekeeping or managing your Housekeeping? N    Patient Care  Team: Janora Norlander, DO as PCP - General (Family Medicine) Hayden Pedro, MD as Consulting Physician (Ophthalmology) Harlen Labs, MD as Referring Physician (Optometry) Milus Banister, MD as Attending Physician (Gastroenterology)  Indicate any recent Medical Services you may have received from other than [facility] providers in the past year (date may be approximate).     Assessment:   This is a routine wellness examination for [name] [name].  Hearing/Vision screen Hearing Screening - Comments:: Denies hearing difficulties   Vision Screening - Comments:: Wears rx glasses - up to date with routine eye exams with [facility] - also seeing Dr Zigmund Daniel  Dietary issues and exercise activities discussed: Current Exercise Habits: The patient does not participate in regular exercise at present, Exercise limited by: None identified   Goals Addressed             This Visit's Progress    Welcome to Medicare   On track    05/22/2022 AWV Goal: Exercise for General Health  Patient will verbalize understanding of the benefits of increased physical activity: Exercising regularly is important. It will improve your overall fitness, flexibility, and endurance. Regular exercise also will improve your overall health. It can help you control your weight, reduce stress, and improve your bone density. Over the next year, patient will increase physical activity as tolerated with a goal of at least 150 minutes of moderate physical activity per week.  You can tell that you are exercising at a moderate intensity if your heart starts beating faster and you start breathing faster but can still hold a conversation. Moderate-intensity exercise ideas include: Walking 1 mile (1.6 km) in about 15 minutes Biking Hiking Golfing Dancing Water aerobics Patient will verbalize understanding of everyday activities that increase physical activity by providing examples like the following: Yard work,  such as: Geographical information systems officer  Washing windows or floors Patient will be able to explain general safety guidelines for exercising:  Before you start a new exercise program, talk with your health care provider. Do not exercise so much that you hurt yourself, feel dizzy, or get very short of breath. Wear comfortable clothes and wear shoes with good support. Drink plenty of water while you exercise to prevent dehydration or heat stroke. Work out until your breathing and your heartbeat get faster.        Depression Screen    05/22/2022    4:00 PM 11/25/2021    9:33 AM 05/22/2021    8:58 AM 05/21/2021    1:44 PM 11/15/2020    9:45 AM 05/31/2020    9:22 AM 03/09/2020   11:18 AM  PHQ 2/9 Scores  PHQ - 2 Score 0 0 0 0 0 0 0  PHQ- 9 Score     0      Fall Risk    05/22/2022    3:56 PM 11/25/2021    9:33 AM 05/22/2021    8:58 AM 05/21/2021    1:50 PM 11/15/2020    9:46 AM  Fall Risk   Falls in the past year? 0 0 0 0 0  Number falls in past yr: 0   0   Injury with Fall? 0   0   Risk for fall due to : No Fall Risks   No Fall Risks   Follow up Falls prevention discussed   Falls prevention discussed     FALL RISK PREVENTION PERTAINING TO THE HOME:  Any stairs in or around the home? Yes  If so, are there any without handrails? No  Home free of loose throw rugs in walkways, pet beds, electrical cords, etc? Yes  Adequate lighting in your home to reduce risk of falls? Yes   ASSISTIVE DEVICES UTILIZED TO PREVENT FALLS:  Life alert? No  Use of a cane, walker or w/c? No  Grab bars in the bathroom? No  Shower chair or bench in shower? Yes  Elevated toilet seat or a handicapped toilet? Yes   TIMED UP AND GO:  Was the test performed? No . Telephonic visit  Cognitive Function:        05/22/2022    4:03 PM 05/16/2020    2:35 PM 05/02/2019    2:24 PM  6CIT Screen  What Year? 0 points 0 points 0 points   What month? 0 points 0 points 0 points  What time? 0 points 0 points 0 points  Count back from 20 0 points 0 points 0 points  Months in reverse 0 points 0 points 0 points  Repeat phrase 0 points 0 points 0 points  Total Score 0 points 0 points 0 points    Immunizations Immunization History  Administered Date(s) Administered   Fluad Quad(high Dose 65+) 08/09/2019   PFIZER(Purple Top)SARS-COV-2 Vaccination 06/27/2020, 07/18/2020   Pneumococcal Conjugate-13 05/02/2019   Pneumococcal Polysaccharide-23 05/31/2020   Tdap 04/21/2011   Zoster Recombinat (Shingrix) 10/19/2018, 04/11/2019   Zoster, Live 05/14/2011    TDAP status: Due, Education has been provided regarding the importance of this vaccine. Advised may receive this vaccine at local pharmacy or Health Dept. Aware to provide a copy of the vaccination record if obtained from local pharmacy or Health Dept. Verbalized acceptance and understanding.  Flu Vaccine status: Declined, Education has been provided regarding the importance of this vaccine but patient still declined. Advised may receive this vaccine at local pharmacy or Health Dept.  Aware to provide a copy of the vaccination record if obtained from local pharmacy or Health Dept. Verbalized acceptance and understanding.  Pneumococcal vaccine status: Up to date  Covid-19 vaccine status: Completed vaccines  Qualifies for Shingles Vaccine? Yes   Zostavax completed Yes   Shingrix Completed?: Yes  Screening Tests Health Maintenance  Topic Date Due   COVID-19 Vaccine (3 - Pfizer risk series) 08/15/2020   INFLUENZA VACCINE  05/13/2022   TETANUS/TDAP  05/22/2022 (Originally 04/20/2021)   MAMMOGRAM  05/10/2023   DEXA SCAN  12/13/2023   COLONOSCOPY (Pts 45-36yr Insurance coverage will need to be confirmed)  07/17/2024   Pneumonia Vaccine 71 Years old  Completed   Hepatitis C Screening  Completed   Zoster Vaccines- Shingrix  Completed   HPV VACCINES  Aged Out    Health  Maintenance  Health Maintenance Due  Topic Date Due   COVID-19 Vaccine (3 - Pfizer risk series) 08/15/2020   INFLUENZA VACCINE  05/13/2022    Colorectal cancer screening: Type of screening: Colonoscopy. Completed 07/17/2021. Repeat every 3 years  Mammogram status: Completed 05/20/2022. Repeat every year  Bone Density status: Completed 12/12/2021. Results reflect: Bone density results: OSTEOPENIA. Repeat every 2 years.  Lung Cancer Screening: (Low Dose CT Chest recommended if Age 69-80years, 30 pack-year currently smoking OR have quit w/in 15years.) does not qualify.   Additional Screening:  Hepatitis C Screening: does qualify; Completed 10/05/2015  Vision Screening: Recommended annual ophthalmology exams for early detection of glaucoma and other disorders of the eye. Is the patient up to date with their annual eye exam?  Yes  Who is the provider or what is the name of the office in which the patient attends annual eye exams[place] is not established with a provider, would they like to be referred to a provider to establish care? No .   Dental Screening: Recommended annual dental exams for proper oral hygiene  Community Resource Referral / Chronic Care Management: CRR required this visit?  No   CCM required this visit?  No      Plan:     I have personally reviewed and noted the following in the patient's chart:   Medical and social history Use of alcohol, tobacco or illicit drugs  Current medications and supplements including opioid prescriptions.  Functional ability and status Nutritional status Physical activity Advanced directives List of other physicians Hospitalizations, surgeries, and ER visits in previous 12 months Vitals Screenings to include cognitive, depression, and falls Referrals and appointments  In addition, I have reviewed and discussed with patient certain preventive protocols, quality metrics, and best practice recommendations.  A written personalized care plan for preventive services as well as general preventive health recommendations were provided to patient.     ASandrea Hammond LPN   [id number]  Nurse Notes: None

## 2022-05-22 NOTE — Patient Instructions (Signed)
[name] , Thank you for taking time to come for your Medicare Wellness Visit. I appreciate your ongoing commitment to your health goals. Please review the following plan we discussed and let me know if I can assist you in the future.   Screening recommendations/referrals: Colonoscopy: Done 07/17/2021 - Repeat in 3 years   Mammogram: Done 05/20/2022 - Repeat annually  Bone Density: Done 07/17/2021 - Repeat in 3 years   Recommended yearly ophthalmology/optometry visit for glaucoma screening and checkup Recommended yearly dental visit for hygiene and checkup  Vaccinations: Influenza vaccine: Declined - recommended every fall Pneumococcal vaccine: Done  05/02/2019 & 05/31/2020   Tdap vaccine: Done 04/21/2011 - Repeat in 10 years* due Shingles vaccine: Done  10/19/2018 & 04/11/2019  Covid-19:Done 06/27/2020 & 07/18/2020  Advanced directives: Advance directive discussed with you today. Even though you declined this today, please call our office should you change your mind, and we can give you the proper paperwork for you to fill out.   Conditions/risks identified: Aim for 30 minutes of exercise or brisk walking, 6-8 glasses of water, and 5 servings of fruits and vegetables each day.   Next appointment: Follow up in one year for your annual wellness visit    Preventive Care 65 Years and Older, Female Preventive care refers to lifestyle choices and visits with your health care provider that can promote health and wellness. What does preventive care include? A yearly physical exam. This is also called an annual well check. Dental exams once or twice a year. Routine eye exams. Ask your health care provider how often you should have your eyes checked. Personal lifestyle choices, including: Daily care of your teeth and gums. Regular physical activity. Eating a healthy diet. Avoiding tobacco and drug use. Limiting alcohol use. Practicing safe sex. Taking low-dose aspirin every day. Taking vitamin and  mineral supplements as recommended by your health care provider. What happens during an annual well check? The services and screenings done by your health care provider during your annual well check will depend on your age, overall health, lifestyle risk factors, and family history of disease. Counseling  Your health care provider may ask you questions about your: Alcohol use. Tobacco use. Drug use. Emotional well-being. Home and relationship well-being. Sexual activity. Eating habits. History of falls. Memory and ability to understand (cognition). Work and work Statistician. Reproductive health. Screening  You may have the following tests or measurements: Height, weight, and BMI. Blood pressure. Lipid and cholesterol levels. These may be checked every 5 years, or more frequently if you are over 73 years old. Skin check. Lung cancer screening. You may have this screening every year starting at age 70 if you have a 30-pack-year history of smoking and currently smoke or have quit within the past 15 years. Fecal occult blood test (FOBT) of the stool. You may have this test every year starting at age 20. Flexible sigmoidoscopy or colonoscopy. You may have a sigmoidoscopy every 5 years or a colonoscopy every 10 years starting at age 41. Hepatitis C blood test. Hepatitis B blood test. Sexually transmitted disease (STD) testing. Diabetes screening. This is done by checking your blood sugar (glucose) after you have not eaten for a while (fasting). You may have this done every 1-3 years. Bone density scan. This is done to screen for osteoporosis. You may have this done starting at age 16. Mammogram. This may be done every 1-2 years. Talk to your health care provider about how often you should have regular mammograms.  Talk with your health care provider about your test results, treatment options, and if necessary, the need for more tests. Vaccines  Your health care provider may recommend  certain vaccines, such as: Influenza vaccine. This is recommended every year. Tetanus, diphtheria, and acellular pertussis (Tdap, Td) vaccine. You may need a Td booster every 10 years. Zoster vaccine. You may need this after age 61. Pneumococcal 13-valent conjugate (PCV13) vaccine. One dose is recommended after age 76. Pneumococcal polysaccharide (PPSV23) vaccine. One dose is recommended after age 31. Talk to your health care provider about which screenings and vaccines you need and how often you need them. This information is not intended to replace advice given to you by your health care provider. Make sure you discuss any questions you have with your health care provider. Document Released: 10/26/2015 Document Revised: 06/18/2016 Document Reviewed: 07/31/2015 Elsevier Interactive Patient Education  2017 [employer] Prevention in the Home Falls can cause injuries. They can happen to people of all ages. There are many things you can do to make your home safe and to help prevent falls. What can I do on the outside of my home? Regularly fix the edges of walkways and driveways and fix any cracks. Remove anything that might make you trip as you walk through a door, such as a raised step or threshold. Trim any bushes or trees on the path to your home. Use bright outdoor lighting. Clear any walking paths of anything that might make someone trip, such as rocks or tools. Regularly check to see if handrails are loose or broken. Make sure that both sides of any steps have handrails. Any raised decks and porches should have guardrails on the edges. Have any leaves, snow, or ice cleared regularly. Use sand or salt on walking paths during winter. Clean up any spills in your garage right away. This includes oil or grease spills. What can I do in the bathroom? Use night lights. Install grab bars by the toilet and in the tub and shower. Do not use towel bars as grab bars. Use non-skid mats or  decals in the tub or shower. If you need to sit down in the shower, use a plastic, non-slip stool. Keep the floor dry. Clean up any water that spills on the floor as soon as it happens. Remove soap buildup in the tub or shower regularly. Attach bath mats securely with double-sided non-slip rug tape. Do not have throw rugs and other things on the floor that can make you trip. What can I do in the bedroom? Use night lights. Make sure that you have a light by your bed that is easy to reach. Do not use any sheets or blankets that are too big for your bed. They should not hang down onto the floor. Have a firm chair that has side arms. You can use this for support while you get dressed. Do not have throw rugs and other things on the floor that can make you trip. What can I do in the kitchen? Clean up any spills right away. Avoid walking on wet floors. Keep items that you use a lot in easy-to-reach places. If you need to reach something above you, use a strong step stool that has a grab bar. Keep electrical cords out of the way. Do not use floor polish or wax that makes floors slippery. If you must use wax, use non-skid floor wax. Do not have throw rugs and other things on the floor that can make  you trip. What can I do with my stairs? Do not leave any items on the stairs. Make sure that there are handrails on both sides of the stairs and use them. Fix handrails that are broken or loose. Make sure that handrails are as long as the stairways. Check any carpeting to make sure that it is firmly attached to the stairs. Fix any carpet that is loose or worn. Avoid having throw rugs at the top or bottom of the stairs. If you do have throw rugs, attach them to the floor with carpet tape. Make sure that you have a light switch at the top of the stairs and the bottom of the stairs. If you do not have them, ask someone to add them for you. What else can I do to help prevent falls? Wear shoes that: Do not  have high heels. Have rubber bottoms. Are comfortable and fit you well. Are closed at the toe. Do not wear sandals. If you use a stepladder: Make sure that it is fully opened. Do not climb a closed stepladder. Make sure that both sides of the stepladder are locked into place. Ask someone to hold it for you, if possible. Clearly mark and make sure that you can see: Any grab bars or handrails. First and last steps. Where the edge of each step is. Use tools that help you move around (mobility aids) if they are needed. These include: Canes. Walkers. Scooters. Crutches. Turn on the lights when you go into a dark area. Replace any light bulbs as soon as they burn out. Set up your furniture so you have a clear path. Avoid moving your furniture around. If any of your floors are uneven, fix them. If there are any pets around you, be aware of where they are. Review your medicines with your doctor. Some medicines can make you feel dizzy. This can increase your chance of falling. Ask your doctor what other things that you can do to help prevent falls. This information is not intended to replace advice given to you by your health care provider. Make sure you discuss any questions you have with your health care provider. Document Released: 07/26/2009 Document Revised: 03/06/2016 Document Reviewed: 11/03/2014 Elsevier Interactive Patient Education  2017 [employer].

## 2022-05-26 ENCOUNTER — Ambulatory Visit: Payer: Medicare PPO | Admitting: Family Medicine

## 2022-05-26 ENCOUNTER — Encounter: Payer: Self-pay | Admitting: Family Medicine

## 2022-05-26 VITALS — BP 119/82 | HR 94 | Temp 97.8°F | Ht 65.0 in | Wt 143.4 lb

## 2022-05-26 DIAGNOSIS — Z23 Encounter for immunization: Secondary | ICD-10-CM

## 2022-05-26 DIAGNOSIS — F411 Generalized anxiety disorder: Secondary | ICD-10-CM | POA: Diagnosis not present

## 2022-05-26 DIAGNOSIS — F339 Major depressive disorder, recurrent, unspecified: Secondary | ICD-10-CM | POA: Diagnosis not present

## 2022-05-26 DIAGNOSIS — E039 Hypothyroidism, unspecified: Secondary | ICD-10-CM

## 2022-05-26 MED ORDER — ONDANSETRON 4 MG PO TBDP: 4.0000 mg | ORAL_TABLET | Freq: Three times a day (TID) | ORAL | 0 refills | Status: DC | PRN

## 2022-05-26 MED ORDER — ESCITALOPRAM OXALATE 10 MG PO TABS: 10.0000 mg | ORAL_TABLET | Freq: Every day | ORAL | 3 refills | Status: DC

## 2022-05-26 NOTE — Progress Notes (Signed)
Subjective: CC: Hypothyroidism PCP: Janora Norlander, DO CBS:WHQPRF [name] [name] is a 71 y.o. female presenting to clinic today for:  1.  Hypothyroidism Patient is here for 59-monthcheckup.  She notes she is fasting would like to get labs collected today.  She is planned with half tablet on Saturdays and 1 tablet daily all others of her Synthroid.  She notes that she has been having some increasing central adiposity since her last visit.  Her MAC weight in her lifetime has been 144 pounds and this was when she was 9 months pregnant.  She notes that she actually maxed out earlier this year at 16and is gradually getting the weight down but she has never struggled with her weight before and this bothers her.  She thought about doing keto pills in efforts to improve weight loss.  2.  Anxiety disorder Patient reports that she has been having some anxiety flareup and has considered going back on Lexapro but always causes her to be nauseated early on so she is been reluctant to restart  ROS: Per HPI  No Known Allergies Past Medical History:  Diagnosis Date   Allergy    Anxiety    Arthritis    mild hips - left worse   BCC (basal cell carcinoma of skin) 07/31/2016   Superficial Bcc left shoulder   Cataract    forming   GERD (gastroesophageal reflux disease)    History of cold sores    Hyperlipidemia    Osteopenia    Rosacea    SVT (supraventricular tachycardia) (HCC)    past hx - no issues   Thyroid disease    hypothyroid    Current Outpatient Medications:    atorvastatin (LIPITOR) 40 MG tablet, TAKE 1 TABLET BY MOUTH EVERY DAY, Disp: 90 tablet, Rfl: 3   Calcium Carbonate-Vitamin D (CALCIUM 500/D PO), Take by mouth., Disp: , Rfl:    Cholecalciferol (VITAMIN D3) 2000 UNITS TABS, Take 1 tablet by mouth daily. 5000 IU daily, Disp: , Rfl:    levothyroxine (SYNTHROID) 75 MCG tablet, Take 1 tablet (75 mcg total) by mouth daily., Disp: 90 tablet, Rfl: 3   clobetasol ointment (TEMOVATE)  0.05 %, APPLY TO AFFECTED AREAS ON EXTERNAL VAGINA NIGHTLY AS NEEDED FOR LICHEN SCLEROSIS (Patient not taking: Reported on 05/22/2022), Disp: 45 g, Rfl: 1   escitalopram (LEXAPRO) 10 MG tablet, TAKE 1 TABLET BY MOUTH EVERY DAY (Patient not taking: Reported on 05/22/2022), Disp: 90 tablet, Rfl: 3 Social History   Socioeconomic History   Marital status: Married    Spouse name: DTimmothy Sours   Number of children: 1   Years of education: 12   Highest education level: High school graduate  Occupational History   Occupation: CConservation officer, historic buildings [employer] Tobacco Use   Smoking status: Never   Smokeless tobacco: Never  Vaping Use   Vaping Use: Never used  Substance and Sexual Activity   Alcohol use: No   Drug use: No   Sexual activity: Yes    Partners: Male  Other Topics Concern   Not on file  Social History Narrative   Patient is married.  Her husband recently retired last March and they have been trying to travel more.   She has a daughter who lives locally.  She has grandchildren.  She is active in her church and tries to stay physically active.   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  She has been retired for a couple of years and is enjoying retirement quite a Holiday representative Strain: Low Risk  (05/22/2022)   Overall Financial Resource Strain (CARDIA)    Difficulty of Paying Living Expenses: Not hard at all  Food Insecurity: No Food Insecurity (05/22/2022)   Hunger Vital Sign    Worried About Running Out of Food in the Last Year: Never true    [place] in the Last Year: Never true  Transportation Needs: No Transportation Needs (05/22/2022)   PRAPARE - Hydrologist (Medical): No    Lack of Transportation (Non-Medical): No  Physical Activity: Inactive (05/22/2022)   Exercise Vital Sign    Days of Exercise per Week: 0 days    Minutes of Exercise  per Session: 0 min  Stress: No Stress Concern Present (05/22/2022)   [place]    Feeling of Stress : Only a little  Social Connections: Socially Integrated (05/22/2022)   Social Connection and Isolation Panel [NHANES]    Frequency of Communication with Friends and Family: More than three times a week    Frequency of Social Gatherings with Friends and Family: More than three times a week    Attends Religious Services: More than 4 times per year    Active Member of [employer] or Organizations: Yes    Attends Music therapist: More than 4 times per year    Marital Status: Married  Human resources officer Violence: Not At Risk (05/22/2022)   Humiliation, Afraid, Rape, and Kick questionnaire    Fear of Current or Ex-Partner: No    Emotionally Abused: No    Physically Abused: No    Sexually Abused: No   Family History  Problem Relation Age of Onset   Colon polyps Mother    Glaucoma Mother    Cerebral aneurysm Mother    Macular degeneration Mother    Stroke Father    Hypertension Father    Lung cancer Father    Brain cancer Brother    Heart attack Maternal Uncle    Heart attack Paternal Uncle    Colon cancer Neg Hx    Stomach cancer Neg Hx    Esophageal cancer Neg Hx    Rectal cancer Neg Hx     Objective: Office vital signs reviewed. BP 119/82   Pulse 94   Temp 97.8 F (36.6 C)   Ht _0  (1.651 m)   Wt 143 lb 6.4 oz (65 kg)   SpO2 95%   BMI 23.86 kg/m   Physical Examination:  General: Awake, alert, well nourished, No acute distress HEENT: Sclera white.  No exophthalmos.  No neck masses Cardio: regular rate and rhythm, S1S2 heard, no murmurs appreciated Pulm: clear to auscultation bilaterally, no wheezes, rhonchi or rales; normal work of breathing on room air GI: Central adiposity present Neuro: No tremor Psych: Mood stable, speech normal, affect appropriate     05/26/2022   11:51 AM 05/22/2022     4:00 PM 11/25/2021    9:33 AM  Depression screen PHQ 2/9  Decreased Interest 0 0 0  Down, Depressed, Hopeless 0 0 0  PHQ - 2 Score 0 0 0      05/26/2022   11:51 AM 11/25/2021    9:33 AM 05/22/2021    8:58 AM 04/21/2018   10:17 AM  GAD 7 :  Generalized Anxiety Score  Nervous, Anxious, on Edge 1 0 0 3  Control/stop worrying 0 0 0 3  Worry too much - different things 1 0 0 3  Trouble relaxing 0 0 0 1  Restless 0 0 0 0  Easily annoyed or irritable 1 0 0 0  Afraid - awful might happen 1 0 0 1  Total GAD 7 Score 4 0 0 11  Anxiety Difficulty Not difficult at all Not difficult at all Not difficult at all Not difficult at all    Assessment/ Plan: 71 y.o. female   Hypothyroidism (acquired) - Plan: TSH, T4, Free, Lipid Panel, CMP14+EGFR  Generalized anxiety disorder - Plan: escitalopram (LEXAPRO) 10 MG tablet  Depression, recurrent (HCC) - Plan: escitalopram (LEXAPRO) 10 MG tablet  Check thyroid levels as she has had some central adiposity that is unusual for her.  We discussed subcutaneous versus visceral fat.  Handout provided today.  Check fasting lipid, CMP.  Resume use of Lexapro.  Zofran given if needed for nausea associated w reinitiation   No orders of the defined types were placed in this encounter.  No orders of the defined types were placed in this encounter.    Janora Norlander, DO [place] [phone]

## 2022-05-26 NOTE — Patient Instructions (Signed)
You had labs performed today.  You will be contacted with the results of the labs once they are available, usually in the next 3 business days for routine lab work.  If you have an active my chart account, they will be released to your MyChart.  If you prefer to have these labs released to you via telephone, please let us know.

## 2022-05-27 LAB — CMP14+EGFR
ALT: 24 IU/L (ref 0–32)
AST: 16 IU/L (ref 0–40)
Albumin/Globulin Ratio: 1.6 (ref 1.2–2.2)
Albumin: 4.1 g/dL (ref 3.9–4.9)
Alkaline Phosphatase: 99 IU/L (ref 44–121)
BUN/Creatinine Ratio: 18 (ref 12–28)
BUN: 18 mg/dL (ref 8–27)
Bilirubin Total: 0.6 mg/dL (ref 0.0–1.2)
CO2: 25 mmol/L (ref 20–29)
Calcium: 9.8 mg/dL (ref 8.7–10.3)
Chloride: 100 mmol/L (ref 96–106)
Creatinine, Ser: 0.98 mg/dL (ref 0.57–1.00)
Globulin, Total: 2.6 g/dL (ref 1.5–4.5)
Glucose: 98 mg/dL (ref 70–99)
Potassium: 4.4 mmol/L (ref 3.5–5.2)
Sodium: 138 mmol/L (ref 134–144)
Total Protein: 6.7 g/dL (ref 6.0–8.5)
eGFR: 63 mL/min/{1.73_m2} (ref >59)

## 2022-05-27 LAB — LIPID PANEL
Chol/HDL Ratio: 2.8 ratio (ref 0.0–4.4)
Cholesterol, Total: 114 mg/dL (ref 100–199)
HDL: 41 mg/dL (ref >39)
LDL Chol Calc (NIH): 51 mg/dL (ref 0–99)
Triglycerides: 124 mg/dL (ref 0–149)
VLDL Cholesterol Cal: 22 mg/dL (ref 5–40)

## 2022-05-27 LAB — T4, FREE: Free T4: 1.67 ng/dL (ref 0.82–1.77)

## 2022-05-27 LAB — TSH: TSH: 0.808 u[IU]/mL (ref 0.450–4.500)

## 2022-08-18 ENCOUNTER — Encounter: Payer: Self-pay | Admitting: Family Medicine

## 2022-08-18 ENCOUNTER — Ambulatory Visit (INDEPENDENT_AMBULATORY_CARE_PROVIDER_SITE_OTHER): Payer: Medicare PPO | Admitting: Family Medicine

## 2022-08-18 ENCOUNTER — Ambulatory Visit: Payer: Medicare PPO

## 2022-08-18 DIAGNOSIS — R52 Pain, unspecified: Secondary | ICD-10-CM | POA: Diagnosis not present

## 2022-08-18 DIAGNOSIS — J069 Acute upper respiratory infection, unspecified: Secondary | ICD-10-CM | POA: Diagnosis not present

## 2022-08-18 LAB — VERITOR FLU A/B WAIVED
Influenza A: NEGATIVE
Influenza B: NEGATIVE

## 2022-08-18 MED ORDER — BENZONATATE 200 MG PO CAPS: 200.0000 mg | ORAL_CAPSULE | Freq: Two times a day (BID) | ORAL | 0 refills | Status: DC | PRN

## 2022-08-18 MED ORDER — HYDROCODONE BIT-HOMATROP MBR 5-1.5 MG/5ML PO SOLN: 5.0000 mL | Freq: Three times a day (TID) | ORAL | 0 refills | Status: DC | PRN

## 2022-08-18 NOTE — Progress Notes (Signed)
Telephone visit  Subjective: CC:flu like symptoms PCP: [name] Norlander, DO SFK:[name] [name] is a 71 y.o. female calls for telephone consult today. Patient provides verbal consent for consult held via phone.  Due to COVID-19 pandemic this visit was conducted virtually. This visit type was conducted due to national recommendations for restrictions regarding the COVID-19 Pandemic (e.g. social distancing, sheltering in place) in an effort to limit this patient's exposure and mitigate transmission in our community. All issues noted in this document were discussed and addressed.  A physical exam was not performed with this format.   Location of patient: home Location of provider: [facility] Others present for call: none  1. URI Onset of flu like symptoms last Tuesday.  She started coughing and this is worse at nighttime. She started feeling malaise/ weak.  She now has sore throat that is intermittent.  She reports cough is dry but she feels congested hoarse.  Using Mucinex and she is drinking plenty of water but not getting mucus out. No hemoptysis. No fevers, no shortness of breath.  She is wheezing in the upper airway.  No known sick contacts. Home COVID test negative.   ROS: Per HPI  No Known Allergies Past Medical History:  Diagnosis Date   Allergy    Anxiety    Arthritis    mild hips - left worse   BCC (basal cell carcinoma of skin) 07/31/2016   Superficial Bcc left shoulder   Cataract    forming   GERD (gastroesophageal reflux disease)    History of cold sores    Hyperlipidemia    Osteopenia    Rosacea    SVT (supraventricular tachycardia) (HCC)    past hx - no issues   Thyroid disease    hypothyroid    Current Outpatient Medications:    atorvastatin (LIPITOR) 40 MG tablet, TAKE 1 TABLET BY MOUTH EVERY DAY, Disp: 90 tablet, Rfl: 3   Calcium Carbonate-Vitamin D (CALCIUM 500/D PO), Take by mouth., Disp: , Rfl:    Cholecalciferol (VITAMIN D3) 2000 UNITS TABS, Take 1 tablet by  mouth daily. 5000 IU daily, Disp: , Rfl:    clobetasol ointment (TEMOVATE) 0.05 %, APPLY TO AFFECTED AREAS ON EXTERNAL VAGINA NIGHTLY AS NEEDED FOR LICHEN SCLEROSIS (Patient not taking: Reported on 05/22/2022), Disp: 45 g, Rfl: 1   escitalopram (LEXAPRO) 10 MG tablet, Take 1 tablet (10 mg total) by mouth daily., Disp: 90 tablet, Rfl: 3   levothyroxine (SYNTHROID) 75 MCG tablet, Take 1 tablet (75 mcg total) by mouth daily., Disp: 90 tablet, Rfl: 3   ondansetron (ZOFRAN-ODT) 4 MG disintegrating tablet, Take 1 tablet (4 mg total) by mouth every 8 (eight) hours as needed for nausea or vomiting., Disp: 20 tablet, Rfl: 0  Assessment/ Plan: 71 y.o. female   URI with cough and congestion - Plan: Veritor Flu A/B Waived, Novel Coronavirus, NAA ([facility]), benzonatate (TESSALON) 200 MG capsule, HYDROcodone bit-homatropine (HYCODAN) 5-1.5 MG/5ML syrup  Sounds viral.  Flu negative.  COVID testing pending but the home test was negative.  Tessalon Perles sent for daytime and Hycodan for nighttime.  I advised her against any alcohol use with this.  May continue Mucinex.  Drink plenty of fluids.  We discussed red flag signs and symptoms warranting further evaluation including signs and symptoms of bacterial infection.  She voiced good understanding of follow-up   The Narcotic Database has been reviewed.  There were no red flags.     Start time: 9:10a End time: 9:17a  Total time  spent on patient care (including telephone call/ virtual visit): 7 minutes  [name], [name] [phone]

## 2022-08-19 LAB — NOVEL CORONAVIRUS, NAA: SARS-CoV-2, NAA: NOT DETECTED

## 2022-09-10 ENCOUNTER — Encounter (INDEPENDENT_AMBULATORY_CARE_PROVIDER_SITE_OTHER): Payer: Medicare PPO | Admitting: Ophthalmology

## 2022-09-10 DIAGNOSIS — H35372 Puckering of macula, left eye: Secondary | ICD-10-CM

## 2022-09-10 DIAGNOSIS — H33303 Unspecified retinal break, bilateral: Secondary | ICD-10-CM

## 2022-09-10 DIAGNOSIS — H43813 Vitreous degeneration, bilateral: Secondary | ICD-10-CM | POA: Diagnosis not present

## 2022-10-15 DIAGNOSIS — H2513 Age-related nuclear cataract, bilateral: Secondary | ICD-10-CM | POA: Diagnosis not present

## 2022-10-15 DIAGNOSIS — H40033 Anatomical narrow angle, bilateral: Secondary | ICD-10-CM | POA: Diagnosis not present

## 2022-11-21 ENCOUNTER — Encounter: Payer: Self-pay | Admitting: Family Medicine

## 2022-11-21 ENCOUNTER — Ambulatory Visit (INDEPENDENT_AMBULATORY_CARE_PROVIDER_SITE_OTHER): Payer: HMO

## 2022-11-21 ENCOUNTER — Other Ambulatory Visit (INDEPENDENT_AMBULATORY_CARE_PROVIDER_SITE_OTHER): Payer: HMO

## 2022-11-21 ENCOUNTER — Ambulatory Visit (INDEPENDENT_AMBULATORY_CARE_PROVIDER_SITE_OTHER): Payer: HMO | Admitting: Family Medicine

## 2022-11-21 VITALS — BP 113/73 | HR 82 | Temp 98.5°F | Ht 65.0 in | Wt 137.0 lb

## 2022-11-21 DIAGNOSIS — R55 Syncope and collapse: Secondary | ICD-10-CM

## 2022-11-21 DIAGNOSIS — J029 Acute pharyngitis, unspecified: Secondary | ICD-10-CM

## 2022-11-21 DIAGNOSIS — R6889 Other general symptoms and signs: Secondary | ICD-10-CM

## 2022-11-21 DIAGNOSIS — R059 Cough, unspecified: Secondary | ICD-10-CM | POA: Diagnosis not present

## 2022-11-21 LAB — VERITOR FLU A/B WAIVED
Influenza A: POSITIVE — AB
Influenza B: NEGATIVE

## 2022-11-21 LAB — RAPID STREP SCREEN (MED CTR MEBANE ONLY): Strep Gp A Ag, IA W/Reflex: NEGATIVE

## 2022-11-21 LAB — CULTURE, GROUP A STREP

## 2022-11-21 MED ORDER — SODIUM CHLORIDE 0.9 % IV BOLUS
500.0000 mL | Freq: Once | INTRAVENOUS | Status: AC
  Administered 2022-11-21: 500 mL via INTRAVENOUS

## 2022-11-21 NOTE — Progress Notes (Signed)
Acute Office Visit  Subjective:     Patient ID: [name] [name], female    DOB: 1950/10/29, 72 y.o.   MRN: [id number]  Chief Complaint  Patient presents with   flu like symptoms    X 3 days Sore throat, cough, congestion, headache, chilld    HPI Patient is in today for flu like symptoms. Symptoms started Tuesday.  Symptoms include N/V/D. Fatigue. Loss of appetite.    Positive flu on rapid test in clinic.   This morning while sitting on a stool trying to dry her hair pt had a syncopal episode with LOC. Husband present during event so she did not fall off of stool. Pt is not on anticoag. She started vomiting during her syncopal event. Husband was concerned she aspirated. Pt has a history of syncopal episodes as a child. Husband states that she has had 3 or 4 syncopal episodes during their marriage. Syncopal episode in 2019 --> hospital. Per note from 08/22/18 syncope consistent with vagal reaction. She states she has not had a cardiac w/up. Per chart received Echo during hospital event.     Review of Systems  Constitutional:  Positive for chills, fever and malaise/fatigue.  HENT:  Positive for congestion. Negative for sore throat.   Eyes:  Negative for blurred vision, double vision and photophobia.  Cardiovascular:  Negative for chest pain, palpitations and leg swelling.  Gastrointestinal:  Positive for diarrhea, nausea and vomiting.  Skin:  Negative for rash.  Neurological:  Positive for loss of consciousness and headaches. Negative for tingling.  Psychiatric/Behavioral:  Negative for depression, substance abuse and suicidal ideas.         Objective:    BP 103/70   Pulse 76   Temp 98.5 F (36.9 C)   Ht 5' 5"$  (1.651 m)   Wt 137 lb (62.1 kg)   SpO2 92%   BMI 22.80 kg/m    Physical Exam Vitals reviewed.  Constitutional:      Appearance: She is ill-appearing.  HENT:     Nose: Congestion and rhinorrhea present.  Cardiovascular:     Rate and Rhythm: Normal rate and  regular rhythm.     Pulses: Normal pulses.     Heart sounds: No murmur heard. Pulmonary:     Effort: No respiratory distress.     Breath sounds: Decreased breath sounds present.     Comments: Coarse lung sounds in posterior lung fields  Abdominal:     General: Abdomen is flat. Bowel sounds are normal. There is no distension.     Palpations: Abdomen is soft.     Tenderness: There is no abdominal tenderness.  Musculoskeletal:        General: Normal range of motion.     Cervical back: Normal range of motion.  Skin:    Coloration: Skin is pale.     Findings: No rash.  Neurological:     General: No focal deficit present.     Mental Status: She is alert and oriented to person, place, and time.  Psychiatric:        Mood and Affect: Mood normal.        Behavior: Behavior normal.          Assessment & Plan:   Problem List Items Addressed This Visit   [name] was seen today for flu like symptoms.  Diagnoses and all orders for this visit: Flu-like symptoms Sore throat -     Veritor Flu A/B Waived -     DG Chest  2 View; Future -     Rapid Strep Screen ([facility] ONLY) Strep negative. Will complete CXR today. Will report results to patient once available. Flu A positive. Results communicated with patient. Discussed symptomatic treatment with patient and when to seek emergency attention and when to return to clinic.   Vasovagal syncope -     EKG 12-Lead -     LONG TERM MONITOR (3-14 DAYS); Future -     CBC with Differential/Platelet -     Basic Metabolic Panel -     DG Chest 2 View; Future -     sodium chloride 0.9 % bolus 500 mL Pt experienced syncopal event this am. Has had multiple syncopal events over lifespan. Labs ordered as above. Will discuss results with patient once resulted. EKG obtained today. Will setup Zio today for follow up. Pt received NS fluid bolus today in clinic and recheck BP was 113/73.   The above assessment and management plan was discussed with the  patient. The patient verbalized understanding of and has agreed to the management plan using shared-decision making. Patient is aware to call the clinic if they develop any new symptoms or if symptoms fail to improve or worsen. Patient is aware when to return to the clinic for a follow-up visit. Patient educated on when it is appropriate to go to the emergency department.    [name] Kohut, FNP

## 2022-11-22 LAB — CBC WITH DIFFERENTIAL/PLATELET
Basophils Absolute: 0 10*3/uL (ref 0.0–0.2)
Basos: 0 %
EOS (ABSOLUTE): 0 10*3/uL (ref 0.0–0.4)
Eos: 0 %
Hematocrit: 38.9 % (ref 34.0–46.6)
Hemoglobin: 12.9 g/dL (ref 11.1–15.9)
Immature Grans (Abs): 0 10*3/uL (ref 0.0–0.1)
Immature Granulocytes: 0 %
Lymphocytes Absolute: 0.9 10*3/uL (ref 0.7–3.1)
Lymphs: 15 %
MCH: 28.3 pg (ref 26.6–33.0)
MCHC: 33.2 g/dL (ref 31.5–35.7)
MCV: 85 fL (ref 79–97)
Monocytes Absolute: 0.4 10*3/uL (ref 0.1–0.9)
Monocytes: 7 %
Neutrophils Absolute: 4.5 10*3/uL (ref 1.4–7.0)
Neutrophils: 78 %
Platelets: 226 10*3/uL (ref 150–450)
RBC: 4.56 x10E6/uL (ref 3.77–5.28)
RDW: 12.6 % (ref 11.7–15.4)
WBC: 5.8 10*3/uL (ref 3.4–10.8)

## 2022-11-22 LAB — BMP8+EGFR
BUN/Creatinine Ratio: 16 (ref 12–28)
BUN: 15 mg/dL (ref 8–27)
CO2: 24 mmol/L (ref 20–29)
Calcium: 8.6 mg/dL — ABNORMAL LOW (ref 8.7–10.3)
Chloride: 102 mmol/L (ref 96–106)
Creatinine, Ser: 0.95 mg/dL (ref 0.57–1.00)
Glucose: 120 mg/dL — ABNORMAL HIGH (ref 70–99)
Potassium: 4 mmol/L (ref 3.5–5.2)
Sodium: 140 mmol/L (ref 134–144)
eGFR: 65 mL/min/{1.73_m2} (ref >59)

## 2022-11-26 DIAGNOSIS — J0100 Acute maxillary sinusitis, unspecified: Secondary | ICD-10-CM | POA: Diagnosis not present

## 2022-11-26 DIAGNOSIS — R051 Acute cough: Secondary | ICD-10-CM | POA: Diagnosis not present

## 2022-11-26 DIAGNOSIS — J9801 Acute bronchospasm: Secondary | ICD-10-CM | POA: Diagnosis not present

## 2022-12-12 DIAGNOSIS — R55 Syncope and collapse: Secondary | ICD-10-CM | POA: Diagnosis not present

## 2022-12-19 ENCOUNTER — Ambulatory Visit: Payer: HMO | Admitting: Family Medicine

## 2022-12-30 ENCOUNTER — Other Ambulatory Visit: Payer: Self-pay | Admitting: Family Medicine

## 2022-12-30 DIAGNOSIS — I471 Supraventricular tachycardia, unspecified: Secondary | ICD-10-CM

## 2022-12-30 DIAGNOSIS — R55 Syncope and collapse: Secondary | ICD-10-CM

## 2023-01-06 ENCOUNTER — Encounter: Payer: Self-pay | Admitting: Family Medicine

## 2023-01-06 ENCOUNTER — Ambulatory Visit (INDEPENDENT_AMBULATORY_CARE_PROVIDER_SITE_OTHER): Payer: HMO | Admitting: Family Medicine

## 2023-01-06 VITALS — BP 107/73 | HR 86 | Temp 98.3°F | Ht 65.0 in | Wt 138.0 lb

## 2023-01-06 DIAGNOSIS — F339 Major depressive disorder, recurrent, unspecified: Secondary | ICD-10-CM

## 2023-01-06 DIAGNOSIS — E7841 Elevated Lipoprotein(a): Secondary | ICD-10-CM | POA: Diagnosis not present

## 2023-01-06 DIAGNOSIS — N3000 Acute cystitis without hematuria: Secondary | ICD-10-CM

## 2023-01-06 DIAGNOSIS — M1812 Unilateral primary osteoarthritis of first carpometacarpal joint, left hand: Secondary | ICD-10-CM

## 2023-01-06 DIAGNOSIS — R3915 Urgency of urination: Secondary | ICD-10-CM | POA: Diagnosis not present

## 2023-01-06 DIAGNOSIS — E039 Hypothyroidism, unspecified: Secondary | ICD-10-CM | POA: Diagnosis not present

## 2023-01-06 DIAGNOSIS — E559 Vitamin D deficiency, unspecified: Secondary | ICD-10-CM | POA: Diagnosis not present

## 2023-01-06 LAB — URINALYSIS, ROUTINE W REFLEX MICROSCOPIC
Bilirubin, UA: NEGATIVE
Glucose, UA: NEGATIVE
Ketones, UA: NEGATIVE
Nitrite, UA: POSITIVE — AB
Protein,UA: NEGATIVE
RBC, UA: NEGATIVE
Specific Gravity, UA: 1.02 (ref 1.005–1.030)
Urobilinogen, Ur: 0.2 mg/dL (ref 0.2–1.0)
pH, UA: 6 (ref 5.0–7.5)

## 2023-01-06 LAB — MICROSCOPIC EXAMINATION
RBC, Urine: NONE SEEN /hpf (ref 0–2)
Renal Epithel, UA: NONE SEEN /hpf

## 2023-01-06 MED ORDER — ESCITALOPRAM OXALATE 10 MG PO TABS: 10.0000 mg | ORAL_TABLET | Freq: Every day | ORAL | 3 refills | Status: DC

## 2023-01-06 MED ORDER — LEVOTHYROXINE SODIUM 75 MCG PO TABS: 75.0000 ug | ORAL_TABLET | Freq: Every day | ORAL | 3 refills | Status: DC

## 2023-01-06 MED ORDER — CEPHALEXIN 500 MG PO CAPS: 500.0000 mg | ORAL_CAPSULE | Freq: Two times a day (BID) | ORAL | 0 refills | Status: AC

## 2023-01-06 MED ORDER — ATORVASTATIN CALCIUM 40 MG PO TABS: 40.0000 mg | ORAL_TABLET | Freq: Every day | ORAL | 3 refills | Status: DC

## 2023-01-06 NOTE — Patient Instructions (Signed)
Interstitial Cystitis  Interstitial cystitis is inflammation of the bladder. This condition is also known as painful bladder syndrome. This may cause pain in the bladder area as well as a frequent and urgent need to urinate. The bladder is an organ that stores urine after the urine is made in the kidneys. The severity of interstitial cystitis can vary from person to person. You may have flare-ups, and then your symptoms may go away for a while. For many people, it becomes a long-term (chronic) problem. What are the causes? The cause of this condition is not known. What increases the risk? The following factors may make you more likely to develop this condition: Being female. Having fibromyalgia. Having irritable bowel syndrome (IBS). Having endometriosis. Having chronic fatigue syndrome. This condition may be aggravated by: Stress. Smoking. Spicy foods. What are the signs or symptoms? Symptoms of interstitial cystitis vary, and they can change over time. Symptoms may include: Discomfort or pain in the bladder area, which is in the lower abdomen. Pain can range from mild to severe. The pain may change in intensity as the bladder fills with urine or as it empties. Pain in the pelvic area, between the hip bones. A constant urge to urinate. Frequent urination. Pain during urination. Pain during sex. Blood in the urine. Feeling tired (fatigue). For women, symptoms often get worse during menstruation. How is this diagnosed? This condition is diagnosed based on your symptoms, your medical history, and a physical exam. Your health care provider may need to rule out other conditions and may order other tests, such as: Urine tests. Cystoscopy. For this test, a tool similar to a very thin telescope is used to look into your bladder. Biopsy. This involves taking a sample of tissue from the bladder to be examined under a microscope. How is this treated? There is no cure for this condition, but  treatment can help you control your symptoms. Work closely with your health care provider to find the most effective treatments for you. Treatment options may include: Medicines to relieve pain and reduce how often you feel the need to urinate. This treatment may include: A procedure where a small amount of medicine that eases irritation is put inside your bladder through a catheter (bladder instillation). Lifestyle changes, such as changing your diet or taking steps to control stress. Physical therapy. This may include: Exercises to help relax the pelvic floor muscles. Massage to relax tight muscles (myofascial release). Learning ways to control when you urinate (bladder training). Using a device that provides electrical stimulation to your nerves, which can relieve pain (neuromodulation therapy). The device is placed on your back, where it blocks the nerves that cause you to feel pain in your bladder area. A procedure that stretches your bladder by filling it with air or fluid (hydrodistention). Surgery. This is rare. It is only done for extreme cases, if other treatments do not help. Follow these instructions at home: Lifestyle Learn and practice relaxation techniques, such as deep breathing and muscle relaxation. Get care for your body and mental well-being, such as: Cognitive behavioral therapy (CBT). This therapy changes the way you think or act in response to different situations. This may improve how you feel. Seeing a mental health therapist to evaluate and treat depression, if necessary. Work with your health care provider on other ways to manage pain. Acupuncture may be helpful. Avoid drinking alcohol. Do not use any products that contain nicotine or tobacco. These products include cigarettes, chewing tobacco, and vaping devices, such   as e-cigarettes. If you need help quitting, ask your health care provider. Eating and drinking Make dietary changes as recommended by your health care  provider. You may need to avoid: Spicy foods. Foods that contain a lot of potassium. Limit your intake of drinks that increase your urge to urinate. These include alcohol and caffeinated drinks like soda, coffee, and tea. Bladder training  Use bladder training techniques as directed. Techniques may include: Urinating at scheduled times. Training yourself to delay urination. Keep a bladder diary. Write down the times you urinate and any symptoms that you have. This can help you find out which foods, liquids, or activities make your symptoms worse. Use your bladder diary to schedule bathroom trips. If you are away from home, plan to be near a bathroom at each of your scheduled times. Make sure that you urinate just before you leave the house and just before you go to bed. General instructions Take over-the-counter and prescription medicines only as told by your health care provider. Try a warm or cool compress over your bladder for comfort. Avoid wearing tight clothing. Do exercises to relax your pelvic floor muscles as told by your physical therapist. Keep all follow-up visits. This is important. Where to find more information To find more information or a support group near you, visit: [employer]: urologyhealth.org Interstitial Cystitis Association: ichelp.org Contact a health care provider if you have: Symptoms that do not get better with treatment. Pain or discomfort that gets worse. More frequent urges to urinate. A fever. Get help right away if: You have no control over when you urinate. Summary Interstitial cystitis is inflammation of the bladder. This condition may cause pain in the bladder area as well as a frequent and urgent need to urinate. You may have flare-ups of the condition, and then it may go away for a while. For many people, it becomes a long-term (chronic) problem. There is no cure for interstitial cystitis, but treatment methods are available to  control your symptoms. This information is not intended to replace advice given to you by your health care provider. Make sure you discuss any questions you have with your health care provider. Document Revised: 05/04/2020 Document Reviewed: 05/04/2020 Elsevier Patient Education  2023 Elsevier Inc.  

## 2023-01-06 NOTE — Progress Notes (Signed)
Subjective: CC: Hypothyroidism PCP: Janora Norlander, DO [id number] [name] [name] is a 72 y.o. female presenting to clinic today for:  1.  Hypothyroidism Patient is compliant with Synthroid.  No reports of tremor but she does note some cracking in her nails.  She thinks this is due to recent use of gel nails.  No changes in bowel habits, weight.  2.  Urinary pressure Patient reports over the last month has been having some intermittent urinary pressure.  No dysuria, hematuria.  She does have urgency.  No flank pain, nausea or vomiting.  She has history of rectocele but denies any vaginal protrusions.  3.  Thumb pain Patient reports pain at the area of the [facility] on the left.  She notes that she had a problem with this before and it was related to her gripping her iPad.  She stopped giving her iPad like she was and it resolved over the last 1 year but now it is back.  Again because she has resume use of her iPad use.  She thinks it may have she has a tendinitis in that area and wants to know if there is anything that she can do about it.  ROS: Per HPI  No Known Allergies Past Medical History:  Diagnosis Date   Allergy    Anxiety    Arthritis    mild hips - left worse   BCC (basal cell carcinoma of skin) 07/31/2016   Superficial Bcc left shoulder   Cataract    forming   GERD (gastroesophageal reflux disease)    History of cold sores    Hyperlipidemia    Osteopenia    Rosacea    SVT (supraventricular tachycardia)    past hx - no issues   Thyroid disease    hypothyroid    Current Outpatient Medications:    Calcium Carbonate-Vitamin D (CALCIUM 500/D PO), Take by mouth., Disp: , Rfl:    cephALEXin (KEFLEX) 500 MG capsule, Take 1 capsule (500 mg total) by mouth 2 (two) times daily for 7 days., Disp: 14 capsule, Rfl: 0   Cholecalciferol (VITAMIN D3) 2000 UNITS TABS, Take 1 tablet by mouth daily. 5000 IU daily, Disp: , Rfl:    clobetasol ointment (TEMOVATE) 0.05 %, APPLY TO AFFECTED  AREAS ON EXTERNAL VAGINA NIGHTLY AS NEEDED FOR LICHEN SCLEROSIS, Disp: 45 g, Rfl: 1   atorvastatin (LIPITOR) 40 MG tablet, Take 1 tablet (40 mg total) by mouth daily., Disp: 90 tablet, Rfl: 3   escitalopram (LEXAPRO) 10 MG tablet, Take 1 tablet (10 mg total) by mouth daily., Disp: 90 tablet, Rfl: 3   levothyroxine (SYNTHROID) 75 MCG tablet, Take 1 tablet (75 mcg total) by mouth daily., Disp: 90 tablet, Rfl: 3 Social History   Socioeconomic History   Marital status: Married    Spouse name: Timmothy Sours    Number of children: 1   Years of education: 12   Highest education level: High school graduate  Occupational History   Occupation: Conservation officer, historic buildings: [place]  Tobacco Use   Smoking status: Never   Smokeless tobacco: Never  Vaping Use   Vaping Use: Never used  Substance and Sexual Activity   Alcohol use: No   Drug use: No   Sexual activity: Yes    Partners: Male  Other Topics Concern   Not on file  Social History Narrative   Patient is married.  Her husband recently retired last March and they have been trying to travel more.  She has a daughter who lives locally.  She has grandchildren.  She is active in her church and tries to stay physically active.   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  She has been retired for a couple of years and is enjoying retirement quite a Holiday representative Strain: Low Risk  (05/22/2022)   Overall Financial Resource Strain (CARDIA)    Difficulty of Paying Living Expenses: Not hard at all  Food Insecurity: No Food Insecurity (05/22/2022)   Hunger Vital Sign    Worried About Running Out of Food in the Last Year: Never true    [place] in the Last Year: Never true  Transportation Needs: No Transportation Needs (05/22/2022)   PRAPARE - Hydrologist (Medical): No    Lack of Transportation (Non-Medical): No   Physical Activity: Inactive (05/22/2022)   Exercise Vital Sign    Days of Exercise per Week: 0 days    Minutes of Exercise per Session: 0 min  Stress: No Stress Concern Present (05/22/2022)   [place]    Feeling of Stress : Only a little  Social Connections: Socially Integrated (05/22/2022)   Social Connection and Isolation Panel [NHANES]    Frequency of Communication with Friends and Family: More than three times a week    Frequency of Social Gatherings with Friends and Family: More than three times a week    Attends Religious Services: More than 4 times per year    Active Member of [employer] or Organizations: Yes    Attends Music therapist: More than 4 times per year    Marital Status: Married  Human resources officer Violence: Not At Risk (05/22/2022)   Humiliation, Afraid, Rape, and Kick questionnaire    Fear of Current or Ex-Partner: No    Emotionally Abused: No    Physically Abused: No    Sexually Abused: No   Family History  Problem Relation Age of Onset   Colon polyps Mother    Glaucoma Mother    Cerebral aneurysm Mother    Macular degeneration Mother    Stroke Father    Hypertension Father    Lung cancer Father    Brain cancer Brother    Heart attack Maternal Uncle    Heart attack Paternal Uncle    Colon cancer Neg Hx    Stomach cancer Neg Hx    Esophageal cancer Neg Hx    Rectal cancer Neg Hx     Objective: Office vital signs reviewed. BP 107/73   Pulse 86   Temp 98.3 F (36.8 C)   Ht 5\' 5"  (1.651 m)   Wt 138 lb (62.6 kg)   SpO2 98%   BMI 22.96 kg/m   Physical Examination:  General: Awake, alert, well nourished, No acute distress HEENT: Sclera white.  Moist mucous membranes.  No exophthalmos.  No goiter Cardio: regular rate and rhythm, S1S2 heard, no murmurs appreciated Pulm: clear to auscultation bilaterally, no wheezes, rhonchi or rales; normal work of breathing on room air MSK:  Left CMC without any gross deformity, inflammation, erythema or warmth.  She has pain with resisted flexion and adduction of that left thumb. Neuro: No tremor  Assessment/ Plan: 72 y.o. female   Hypothyroidism (acquired) - Plan: TSH, T4, free  Acute cystitis without hematuria - Plan: Urinalysis,  Routine w reflex microscopic, cephALEXin (KEFLEX) 500 MG capsule  Arthritis of carpometacarpal (CMC) joint of left thumb  Depression, recurrent ([place]) - Plan: escitalopram (LEXAPRO) 10 MG tablet  Elevated lipoprotein(a) - Plan: atorvastatin (LIPITOR) 40 MG tablet, CMP14+EGFR, Lipid Panel  Vitamin D deficiency - Plan: VITAMIN D 25 Hydroxy (Vit-D Deficiency, Fractures)  Essentially asymptomatic from a thyroid standpoint.  Check thyroid labs.  She also wished to have fasting labs done and these were collected as well  I considered IC as a possible etiology of urinary symptoms and we also discussed potential overactive bladder but it turns out she actually has a urinary tract infection as she was nitrite positive.  I am sending Keflex in for her to treat.  She will contact me if symptoms or not resolving  Suspect arthritis of the [facility] though she could have some type of tendinitis in this area due to overuse with her iPad.  We discussed stretching, use of topical NSAIDs.  If she desires I can place referral for consideration of injection into that area.  She will let me know if she desires this  Did not discuss depression, anxiety, cholesterol or vitamin D deficiency during today's visit but she wanted labs of these were collected   Orders Placed This Encounter  Procedures   Microscopic Examination   CMP14+EGFR   Lipid Panel   TSH   T4, free   VITAMIN D 25 Hydroxy (Vit-D Deficiency, Fractures)   Urinalysis, Routine w reflex microscopic   Meds ordered this encounter  Medications   escitalopram (LEXAPRO) 10 MG tablet    Sig: Take 1 tablet (10 mg total) by mouth daily.    Dispense:  90 tablet     Refill:  3   atorvastatin (LIPITOR) 40 MG tablet    Sig: Take 1 tablet (40 mg total) by mouth daily.    Dispense:  90 tablet    Refill:  3    DX Code Needed  .E78.41   levothyroxine (SYNTHROID) 75 MCG tablet    Sig: Take 1 tablet (75 mcg total) by mouth daily.    Dispense:  90 tablet    Refill:  3   cephALEXin (KEFLEX) 500 MG capsule    Sig: Take 1 capsule (500 mg total) by mouth 2 (two) times daily for 7 days.    Dispense:  14 capsule    Refill:  [place], DO [place] [phone]

## 2023-01-07 LAB — CMP14+EGFR
ALT: 21 IU/L (ref 0–32)
AST: 17 IU/L (ref 0–40)
Albumin/Globulin Ratio: 1.8 (ref 1.2–2.2)
Albumin: 4.2 g/dL (ref 3.9–4.9)
Alkaline Phosphatase: 94 IU/L (ref 44–121)
BUN/Creatinine Ratio: 17 (ref 12–28)
BUN: 16 mg/dL (ref 8–27)
Bilirubin Total: 0.6 mg/dL (ref 0.0–1.2)
CO2: 24 mmol/L (ref 20–29)
Calcium: 9.4 mg/dL (ref 8.7–10.3)
Chloride: 103 mmol/L (ref 96–106)
Creatinine, Ser: 0.95 mg/dL (ref 0.57–1.00)
Globulin, Total: 2.3 g/dL (ref 1.5–4.5)
Glucose: 98 mg/dL (ref 70–99)
Potassium: 4.4 mmol/L (ref 3.5–5.2)
Sodium: 141 mmol/L (ref 134–144)
Total Protein: 6.5 g/dL (ref 6.0–8.5)
eGFR: 65 mL/min/{1.73_m2} (ref >59)

## 2023-01-07 LAB — LIPID PANEL
Chol/HDL Ratio: 2.7 ratio (ref 0.0–4.4)
Cholesterol, Total: 128 mg/dL (ref 100–199)
HDL: 47 mg/dL (ref >39)
LDL Chol Calc (NIH): 61 mg/dL (ref 0–99)
Triglycerides: 110 mg/dL (ref 0–149)
VLDL Cholesterol Cal: 20 mg/dL (ref 5–40)

## 2023-01-07 LAB — TSH: TSH: 0.584 u[IU]/mL (ref 0.450–4.500)

## 2023-01-07 LAB — T4, FREE: Free T4: 1.74 ng/dL (ref 0.82–1.77)

## 2023-01-07 LAB — VITAMIN D 25 HYDROXY (VIT D DEFICIENCY, FRACTURES): Vit D, 25-Hydroxy: 66.1 ng/mL (ref 30.0–100.0)

## 2023-01-28 ENCOUNTER — Encounter: Payer: HMO | Admitting: Family Medicine

## 2023-02-08 NOTE — Progress Notes (Unsigned)
Cardiology Office Note:   Date:  02/10/2023  ID:  [name] [name], DOB 04/30/1951, MRN [id number]  History of Present Illness:   [name] [name] is a 72 y.o. female anxiety, GERD, HTN, and SVT who was referred by Dr. Nadine Counts for further evaluation of syncope and SVT.  Patient seen by PCP office on 11/21/22. Notes reviewed. Had episode of flu like symptoms and when sitting on a stool to dry her hair, she had an epiosde of syncope. Had vomiting following the episodes. Notably, had history of syncope deemed to be vasovagal in nature. Follow-up cardiac monitor 12/2022 showed SB-ST with 14 runs of SVT with longest lasting 18.6s. One pause lasting 3.1s. Rare SVE, rare VE. Now referred to Cardiology for further evaluation.  Today, patient states that she has had episodes of syncope throughout her life that were likely vagal in nature. Had recent episode as detailed above which prompted the cardiac monitor. Otherwise, no chest pain, SOB, orthopnea, PND, LE edema, lightheaded, or palpitations.   Admits she snores and is interested in pursuing sleep study.  Family history: No known family history of CAD.   Past Medical History:  Diagnosis Date   Allergy    Anxiety    Arthritis    mild hips - left worse   BCC (basal cell carcinoma of skin) 07/31/2016   Superficial Bcc left shoulder   Cataract    forming   GERD (gastroesophageal reflux disease)    History of cold sores    Hyperlipidemia    Osteopenia    Rosacea    SVT (supraventricular tachycardia)    past hx - no issues   Thyroid disease    hypothyroid     ROS: As per HPI  Studies Reviewed:    EKG:  No new tracing  Cardiac Studies & Procedures       ECHOCARDIOGRAM  ECHOCARDIOGRAM COMPLETE 08/23/2018  Narrative *[facility]* [address] [place], [place] [address] [phone]  ------------------------------------------------------------------- Transthoracic Echocardiography  Patient:    [name] [name] MR  #:       [id number] Study Date: 08/23/2018 Gender:     F Age:        14 Height:     165.1 cm Weight:     59.9 kg BSA:        1.66 m^2 Pt. Status: Room:       A311  SONOGRAPHER  [address] Onnie Boer [id number] ATTENDING    Catarina Hartshorn [id number] VZDGLOVF     IEP, PIRJJ [id number] Leana Roe [id number] PERFORMING   [facility], Jeani Hawking  cc:  ------------------------------------------------------------------- LV EF: 65% -   70%  ------------------------------------------------------------------- Indications:      Syncope 780.2.  ------------------------------------------------------------------- History:   PMH:   Syncope.  Risk factors:  Dyslipidemia.  ------------------------------------------------------------------- Study Conclusions  - Left ventricle: The cavity size was normal. Wall thickness was normal. Systolic function was vigorous. The estimated ejection fraction was in the range of 65% to 70%. Wall motion was normal; there were no regional wall motion abnormalities. Findings consistent with left ventricular diastolic dysfunction, grade indeterminate. Indeterminate filling pressures. - Atrial septum: No defect or patent foramen ovale was identified.  ------------------------------------------------------------------- Study data:  Comparison was made to the study of 02/07/2014.  Study status:  Routine.  Procedure:  Transthoracic echocardiography. Image quality was adequate.          Transthoracic echocardiography.  M-mode, complete 2D, spectral Doppler, and color Doppler.  Birthdate:  Patient birthdate: May 06, 1951.  Age:  Patient is 72 yr old.  Sex:  Gender: female.    BMI: 22 kg/m^2.  Blood pressure:     99/64  Patient status:  Inpatient.  Study date: Study date: 08/23/2018. Study time: 09:30 AM.  Location:   Bedside.  -------------------------------------------------------------------  ------------------------------------------------------------------- Left ventricle:  The cavity size was normal. Wall thickness was normal. Systolic function was vigorous. The estimated ejection fraction was in the range of 65% to 70%. Wall motion was normal; there were no regional wall motion abnormalities. Findings consistent with left ventricular diastolic dysfunction, grade indeterminate. Indeterminate filling pressures.  ------------------------------------------------------------------- Aortic valve:   Trileaflet.  Doppler:   There was no stenosis. There was no regurgitation.  ------------------------------------------------------------------- Aorta:  Aortic root: The aortic root was normal in size.  ------------------------------------------------------------------- Mitral valve:   Normal thickness leaflets .  Doppler:  There was trivial regurgitation.    Peak gradient (D): 3 mm Hg.  ------------------------------------------------------------------- Left atrium:  The atrium was normal in size.  ------------------------------------------------------------------- Atrial septum:  No defect or patent foramen ovale was identified.  ------------------------------------------------------------------- Right ventricle:  The cavity size was normal. Wall thickness was normal. Systolic function was normal.  ------------------------------------------------------------------- Pulmonic valve:    The valve appears to be grossly normal. Doppler:  There was no significant regurgitation.  ------------------------------------------------------------------- Tricuspid valve:   Structurally normal valve.   Leaflet separation was normal.  Doppler:  Transvalvular velocity was within the normal range. There was no significant regurgitation.  ------------------------------------------------------------------- Right  atrium:  The atrium was normal in size.  ------------------------------------------------------------------- Pericardium:  There was no pericardial effusion.  ------------------------------------------------------------------- Systemic veins: Inferior vena cava: The vessel was normal in size. The respirophasic diameter changes were in the normal range (>= 50%), consistent with normal central venous pressure.  ------------------------------------------------------------------- Measurements  Left ventricle                         Value        Reference LV ID, ED, PLAX chordal        (L)     41.3  mm     43 - 52 LV ID, ES, PLAX chordal                27.8  mm     23 - 38 LV fx shortening, PLAX chordal         33    %      >=29 LV PW thickness, ED                    9.48  mm     ---------- IVS/LV PW ratio, ED                    0.98         <=1.3 LV e&', lateral                         6.96  cm/s   ---------- LV E/e&', lateral                       11.36        ---------- LV e&', medial                          6.31  cm/s   ---------- LV E/e&', medial  12.54        ---------- LV e&', average                         6.64  cm/s   ---------- LV E/e&', average                       11.92        ----------  Ventricular septum                     Value        Reference IVS thickness, ED                      9.32  mm     ----------  LVOT                                   Value        Reference LVOT ID, S                             19    mm     ---------- LVOT area                              2.84  cm^2   ----------  Aorta                                  Value        Reference Aortic root ID, ED                     29    mm     ----------  Left atrium                            Value        Reference LA ID, A-P, ES                         31    mm     ---------- LA ID/bsa, A-P                         1.87  cm/m^2 <=2.2 LA volume, S                           18.5   ml     ---------- LA volume/bsa, S                       11.1  ml/m^2 ---------- LA volume, ES, 1-p A4C                 16.1  ml     ---------- LA volume/bsa, ES, 1-p A4C             9.7   ml/m^2 ---------- LA volume, ES, 1-p A2C                 19.1  ml     ---------- LA volume/bsa, ES, 1-p  A2C             11.5  ml/m^2 ----------  Mitral valve                           Value        Reference Mitral E-wave peak velocity            79.1  cm/s   ---------- Mitral A-wave peak velocity            73.2  cm/s   ---------- Mitral deceleration time       (H)     268   ms     150 - 230 Mitral peak gradient, D                3     mm Hg  ---------- Mitral E/A ratio, peak                 1.1          ----------  Right atrium                           Value        Reference RA ID, S-I, ES, A4C                    42.9  mm     34 - 49 RA area, ES, A4C                       10.4  cm^2   8.3 - 19.5 RA volume, ES, A/L                     19.9  ml     ---------- RA volume/bsa, ES, A/L                 12    ml/m^2 ----------  Systemic veins                         Value        Reference Estimated CVP                          3     mm Hg  ----------  Right ventricle                        Value        Reference TAPSE                                  22.6  mm     ---------- RV s&', lateral, S                      13.3  cm/s   ----------  Legend: (L)  and  (H)  mark values outside specified reference range.  ------------------------------------------------------------------- Prepared and Electronically Authenticated by  Prentice Docker, MD 2019-11-11T10:39:41    MONITORS  LONG TERM MONITOR (3-14 DAYS) 12/30/2022  Narrative Tachy-brady syndrome on zio. Will refer back to Cardiology to evaluate.  Patch Wear Time:  4 days and 8 hours (2024-02-09T11:39:12-498 to 2024-02-13T20:07:09-499)  Patient had a min HR of 50 bpm, max HR of  200 bpm, and avg HR of 74 bpm. Predominant underlying rhythm was  Sinus Rhythm. 14 Supraventricular Tachycardia runs occurred, the run with the fastest interval lasting 6 beats with a max rate of 200 bpm, the longest lasting 18.6 secs with an avg rate of 129 bpm. 1 Pause occurred lasting 3.1 secs (20 bpm). Isolated SVEs were rare (<1.0%), SVE Couplets were rare (<1.0%), and SVE Triplets were rare (<1.0%). Isolated VEs were rare (<1.0%), and no VE Couplets or VE Triplets were present. Ventricular Trigeminy was present.            Risk Assessment/Calculations:         STOP-Bang Score:  3 (Simultaneous filing. User may not have seen previous data.)       Physical Exam:   VS:  BP 114/75   Pulse 80   Ht 5\' 5"  (1.651 m)   Wt 136 lb 9.6 oz (62 kg)   SpO2 96%   BMI 22.73 kg/m    Wt Readings from Last 3 Encounters:  02/10/23 136 lb 9.6 oz (62 kg)  01/06/23 138 lb (62.6 kg)  11/21/22 137 lb (62.1 kg)     GEN: Well nourished, well developed in no acute distress NECK: No JVD; No carotid bruits CARDIAC: RRR, no murmurs, rubs, gallops RESPIRATORY:  Clear to auscultation without rales, wheezing or rhonchi  ABDOMEN: Soft, non-tender, non-distended EXTREMITIES:  No edema; No deformity   ASSESSMENT AND PLAN:    #Vasovagal Syncope/Orthostatic Syncope -Episodes sound consistent with vasovagal vs orthostatic syncope with prodromal symptoms  -Cardiac monitor with brief episodes of SVT but no sustained arrhythmias; 3.1s pause occurred with sleep but no prolonged pauses that would lead to syncope -TTE in 2019 with normal BiV function and no significant valve disease -Will continue with conservative management   #SVT: -Brief episodes of cardiac monitor lasting up to 18s -Patient denies palpitations and no need for further treatment at this time  #Sinus Pause: #Concern for sleep apnea: -Patient with snoring and 3.1 s pause on cardiac monitor while sleeping -Will refer for sleep study  #HLD: -Continue lipitor 40mg  daily -Check Ca score         Signed, Meriam Sprague, MD

## 2023-02-10 ENCOUNTER — Telehealth: Payer: Self-pay | Admitting: *Deleted

## 2023-02-10 ENCOUNTER — Encounter: Payer: Self-pay | Admitting: Cardiology

## 2023-02-10 ENCOUNTER — Ambulatory Visit: Payer: HMO | Attending: Cardiology | Admitting: Cardiology

## 2023-02-10 VITALS — BP 114/75 | HR 80 | Ht 65.0 in | Wt 136.6 lb

## 2023-02-10 DIAGNOSIS — E782 Mixed hyperlipidemia: Secondary | ICD-10-CM | POA: Diagnosis not present

## 2023-02-10 DIAGNOSIS — R400 Somnolence: Secondary | ICD-10-CM

## 2023-02-10 DIAGNOSIS — E7841 Elevated Lipoprotein(a): Secondary | ICD-10-CM | POA: Diagnosis not present

## 2023-02-10 DIAGNOSIS — I471 Supraventricular tachycardia, unspecified: Secondary | ICD-10-CM | POA: Diagnosis not present

## 2023-02-10 DIAGNOSIS — R55 Syncope and collapse: Secondary | ICD-10-CM

## 2023-02-10 DIAGNOSIS — R0683 Snoring: Secondary | ICD-10-CM | POA: Diagnosis not present

## 2023-02-10 NOTE — Patient Instructions (Signed)
Medication Instructions:  Your physician recommends that you continue on your current medications as directed. Please refer to the Current Medication list given to you today.  *If you need a refill on your cardiac medications before your next appointment, please call your pharmacy*   Lab Work: NONE If you have labs (blood work) drawn today and your tests are completely normal, you will receive your results only by: MyChart Message (if you have MyChart) OR A paper copy in the mail If you have any lab test that is abnormal or we need to change your treatment, we will call you to review the results.   Testing/Procedures: CARDIAC CALCIUM SCORING ( SELF PAY)     Your physician has recommended that you have a ITAMAR sleep study . This test records several body functions during sleep, including: brain activity, eye movement, oxygen and carbon dioxide blood levels, heart rate and rhythm, breathing rate and rhythm, the flow of air through your mouth and nose, snoring, body muscle movements, and chest and belly movement.     Follow-Up: AS NEEDED At [facility], you and your health needs are our priority.  As part of our continuing mission to provide you with exceptional heart care, we have created designated Provider Care Teams.  These Care Teams include your primary Cardiologist (physician) and Advanced Practice Providers (APPs -  Physician Assistants and Nurse Practitioners) who all work together to provide you with the care you need, when you need it.  We recommend signing up for the patient portal called "MyChart".  Sign up information is provided on this After Visit Summary.  MyChart is used to connect with patients for Virtual Visits (Telemedicine).  Patients are able to view lab/test results, encounter notes, upcoming appointments, etc.  Non-urgent messages can be sent to your provider as well.   To learn more about what you can do with MyChart, go to [email or web address].     Your next appointment:   AS NEEDED WITH DR. Shari Prows

## 2023-02-10 NOTE — Telephone Encounter (Signed)
Pt has been ordered an Special educational needs teacher study per Dr. Shari Prows. Pt agreeable to signed waiver and to not open the box until has been approved. Once approved I will call the pt with the PIN#.

## 2023-02-16 NOTE — Telephone Encounter (Signed)
Prior Authorization for ITAMAR sent to [facility] via web portal. Tracking Number .  READY-NO PA REQ 

## 2023-02-16 NOTE — Telephone Encounter (Signed)
Left message for the pt she has been approved to proceed with Itamar study. I left detailed vm with PIN # [id number] and if she could do the study one night this week. If not please call back to [phone] and let me known when she could do the sleep study.

## 2023-02-17 ENCOUNTER — Encounter (INDEPENDENT_AMBULATORY_CARE_PROVIDER_SITE_OTHER): Payer: HMO | Admitting: Cardiology

## 2023-02-17 DIAGNOSIS — G4733 Obstructive sleep apnea (adult) (pediatric): Secondary | ICD-10-CM

## 2023-02-23 ENCOUNTER — Ambulatory Visit: Payer: HMO | Attending: Cardiology

## 2023-02-23 DIAGNOSIS — E7841 Elevated Lipoprotein(a): Secondary | ICD-10-CM

## 2023-02-23 DIAGNOSIS — R400 Somnolence: Secondary | ICD-10-CM

## 2023-02-23 DIAGNOSIS — R0683 Snoring: Secondary | ICD-10-CM

## 2023-02-23 NOTE — Procedures (Signed)
SLEEP STUDY REPORT Patient Information Study Date: 02/17/2023 Patient Name: [name] [name] Patient ID: [id number] Birth Date: May 09, 1951 Age: 72 Gender: Female BMI: 22.8 (W=137 lb, H=5' 5'') Stopbang: 3 Referring Physician: Laurance Flatten, MD  TEST DESCRIPTION: Home sleep apnea testing was completed using the WatchPat, a Type 1 device, utilizing peripheral arterial tonometry (PAT), chest movement, actigraphy, pulse oximetry, pulse rate, body position and snore. AHI was calculated with apnea and hypopnea using valid sleep time as the denominator. RDI includes apneas, hypopneas, and RERAs. The data acquired and the scoring of sleep and all associated events were performed in accordance with the recommended standards and specifications as outlined in the AASM Manual for the Scoring of Sleep and Associated Events 2.2.0 (2015).   FINDINGS:   1. Mild Obstructive Sleep Apnea with AHI 9.2/hr.   2. No Central Sleep Apnea with pAHIc 2/hr.   3. Oxygen desaturations as low as 87%.   4. Mild to moderate snoring was present. O2 sats were < 88% for 0 min.   5. Total sleep time was 7 hrs and 42 min.   6. 20.1% of total sleep time was spent in REM sleep.   7. Normal sleep onset latency at 16 min.   8. Shortened REM sleep onset latency at 49 min.   9. Total awakenings were 5.  10. Arrhythmia detection:  None  DIAGNOSIS: Mild Obstructive Sleep Apnea (G47.33)  RECOMMENDATIONS:   1.  Clinical correlation of these findings is necessary.  The decision to treat obstructive sleep apnea (OSA) is usually based on the presence of apnea symptoms or the presence of associated medical conditions such as Hypertension, Congestive Heart Failure, Atrial Fibrillation or Obesity.  The most common symptoms of OSA are snoring, gasping for breath while sleeping, daytime sleepiness and fatigue.   2.  Initiating apnea therapy is recommended given the presence of symptoms and/or associated conditions. Recommend  proceeding with one of the following:     a.  Auto-CPAP therapy with a pressure range of 5-20cm H2O.     b.  An oral appliance (OA) that can be obtained from certain dentists with expertise in sleep medicine.  These are primarily of use in non-obese patients with mild and moderate disease.     c.  An ENT consultation which may be useful to look for specific causes of obstruction and possible treatment options.     d.  If patient is intolerant to PAP therapy, consider referral to ENT for evaluation for hypoglossal nerve stimulator.   3.  Close follow-up is necessary to ensure success with CPAP or oral appliance therapy for maximum benefit.  4.  A follow-up oximetry study on CPAP is recommended to assess the adequacy of therapy and determine the need for supplemental oxygen or the potential need for Bi-level therapy.  An arterial blood gas to determine the adequacy of baseline ventilation and oxygenation should also be considered.  5.  Healthy sleep recommendations include:  adequate nightly sleep (normal 7-9 hrs/night), avoidance of caffeine after noon and alcohol near bedtime, and maintaining a sleep environment that is cool, dark and quiet.  6.  Weight loss for overweight patients is recommended.  Even modest amounts of weight loss can significantly improve the severity of sleep apnea.  7.  Snoring recommendations include:  weight loss where appropriate, side sleeping, and avoidance of alcohol before bed.  8.  Operation of motor vehicle should be avoided when sleepy.  Signature: Armanda Magic, MD; [facility]; Diplomat, American Board of Sleep  Medicine Electronically Signed: 02/23/2023 8:49:26 PM

## 2023-03-26 ENCOUNTER — Other Ambulatory Visit (HOSPITAL_BASED_OUTPATIENT_CLINIC_OR_DEPARTMENT_OTHER): Payer: HMO

## 2023-04-03 ENCOUNTER — Other Ambulatory Visit (HOSPITAL_BASED_OUTPATIENT_CLINIC_OR_DEPARTMENT_OTHER): Payer: HMO

## 2023-05-26 DIAGNOSIS — Z1231 Encounter for screening mammogram for malignant neoplasm of breast: Secondary | ICD-10-CM | POA: Diagnosis not present

## 2023-05-27 ENCOUNTER — Ambulatory Visit: Payer: HMO

## 2023-05-27 VITALS — Ht 65.0 in | Wt 140.0 lb

## 2023-05-27 DIAGNOSIS — Z0000 Encounter for general adult medical examination without abnormal findings: Secondary | ICD-10-CM | POA: Diagnosis not present

## 2023-05-27 NOTE — Patient Instructions (Signed)
[name] [name] , Thank you for taking time to come for your Medicare Wellness Visit. I appreciate your ongoing commitment to your health goals. Please review the following plan we discussed and let me know if I can assist you in the future.   Referrals/Orders/Follow-Ups/Clinician Recommendations: Aim for 30 minutes of exercise or brisk walking, 6-8 glasses of water, and 5 servings of fruits and vegetables each day.   This is a list of the screening recommended for you and due dates:  Health Maintenance  Topic Date Due   COVID-19 Vaccine (3 - Pfizer risk series) 08/15/2020   Mammogram  05/21/2023   Flu Shot  05/14/2023   DEXA scan (bone density measurement)  12/13/2023   Medicare Annual Wellness Visit  05/26/2024   Colon Cancer Screening  07/17/2024   DTaP/Tdap/Td vaccine (3 - Td or Tdap) 05/26/2032   Pneumonia Vaccine  Completed   Hepatitis C Screening  Completed   Zoster (Shingles) Vaccine  Completed   HPV Vaccine  Aged Out    Advanced directives: (Provided) Advance directive discussed with you today. I have provided a copy for you to complete at home and have notarized. Once this is complete, please bring a copy in to our office so we can scan it into your chart. Information on Advanced Care Planning can be found at [facility] Advance Health Care Directives Advance Health Care Directives ([email or web address]    Next Medicare Annual Wellness Visit scheduled for next year: Yes  Preventive Care 65 Years and Older, Female Preventive care refers to lifestyle choices and visits with your health care provider that can promote health and wellness. What does preventive care include? A yearly physical exam. This is also called an annual well check. Dental exams once or twice a year. Routine eye exams. Ask your health care provider how often you should have your eyes checked. Personal lifestyle choices, including: Daily care of your teeth and gums. Regular physical activity. Eating  a healthy diet. Avoiding tobacco and drug use. Limiting alcohol use. Practicing safe sex. Taking low-dose aspirin every day. Taking vitamin and mineral supplements as recommended by your health care provider. What happens during an annual well check? The services and screenings done by your health care provider during your annual well check will depend on your age, overall health, lifestyle risk factors, and family history of disease. Counseling  Your health care provider may ask you questions about your: Alcohol use. Tobacco use. Drug use. Emotional well-being. Home and relationship well-being. Sexual activity. Eating habits. History of falls. Memory and ability to understand (cognition). Work and work Astronomer. Reproductive health. Screening  You may have the following tests or measurements: Height, weight, and BMI. Blood pressure. Lipid and cholesterol levels. These may be checked every 5 years, or more frequently if you are over 32 years old. Skin check. Lung cancer screening. You may have this screening every year starting at age 57 if you have a 30-pack-year history of smoking and currently smoke or have quit within the past 15 years. Fecal occult blood test (FOBT) of the stool. You may have this test every year starting at age 86. Flexible sigmoidoscopy or colonoscopy. You may have a sigmoidoscopy every 5 years or a colonoscopy every 10 years starting at age 17. Hepatitis C blood test. Hepatitis B blood test. Sexually transmitted disease (STD) testing. Diabetes screening. This is done by checking your blood sugar (glucose) after you have not eaten for a while (fasting). You may have this done  every 1-3 years. Bone density scan. This is done to screen for osteoporosis. You may have this done starting at age 74. Mammogram. This may be done every 1-2 years. Talk to your health care provider about how often you should have regular mammograms. Talk with your health care  provider about your test results, treatment options, and if necessary, the need for more tests. Vaccines  Your health care provider may recommend certain vaccines, such as: Influenza vaccine. This is recommended every year. Tetanus, diphtheria, and acellular pertussis (Tdap, Td) vaccine. You may need a Td booster every 10 years. Zoster vaccine. You may need this after age 8. Pneumococcal 13-valent conjugate (PCV13) vaccine. One dose is recommended after age 66. Pneumococcal polysaccharide (PPSV23) vaccine. One dose is recommended after age 42. Talk to your health care provider about which screenings and vaccines you need and how often you need them. This information is not intended to replace advice given to you by your health care provider. Make sure you discuss any questions you have with your health care provider. Document Released: 10/26/2015 Document Revised: 06/18/2016 Document Reviewed: 07/31/2015 Elsevier Interactive Patient Education  2017 [employer].  Fall Prevention in the Home Falls can cause injuries. They can happen to people of all ages. There are many things you can do to make your home safe and to help prevent falls. What can I do on the outside of my home? Regularly fix the edges of walkways and driveways and fix any cracks. Remove anything that might make you trip as you walk through a door, such as a raised step or threshold. Trim any bushes or trees on the path to your home. Use bright outdoor lighting. Clear any walking paths of anything that might make someone trip, such as rocks or tools. Regularly check to see if handrails are loose or broken. Make sure that both sides of any steps have handrails. Any raised decks and porches should have guardrails on the edges. Have any leaves, snow, or ice cleared regularly. Use sand or salt on walking paths during winter. Clean up any spills in your garage right away. This includes oil or grease spills. What can I do in the  bathroom? Use night lights. Install grab bars by the toilet and in the tub and shower. Do not use towel bars as grab bars. Use non-skid mats or decals in the tub or shower. If you need to sit down in the shower, use a plastic, non-slip stool. Keep the floor dry. Clean up any water that spills on the floor as soon as it happens. Remove soap buildup in the tub or shower regularly. Attach bath mats securely with double-sided non-slip rug tape. Do not have throw rugs and other things on the floor that can make you trip. What can I do in the bedroom? Use night lights. Make sure that you have a light by your bed that is easy to reach. Do not use any sheets or blankets that are too big for your bed. They should not hang down onto the floor. Have a firm chair that has side arms. You can use this for support while you get dressed. Do not have throw rugs and other things on the floor that can make you trip. What can I do in the kitchen? Clean up any spills right away. Avoid walking on wet floors. Keep items that you use a lot in easy-to-reach places. If you need to reach something above you, use a strong step stool that has  a grab bar. Keep electrical cords out of the way. Do not use floor polish or wax that makes floors slippery. If you must use wax, use non-skid floor wax. Do not have throw rugs and other things on the floor that can make you trip. What can I do with my stairs? Do not leave any items on the stairs. Make sure that there are handrails on both sides of the stairs and use them. Fix handrails that are broken or loose. Make sure that handrails are as long as the stairways. Check any carpeting to make sure that it is firmly attached to the stairs. Fix any carpet that is loose or worn. Avoid having throw rugs at the top or bottom of the stairs. If you do have throw rugs, attach them to the floor with carpet tape. Make sure that you have a light switch at the top of the stairs and the  bottom of the stairs. If you do not have them, ask someone to add them for you. What else can I do to help prevent falls? Wear shoes that: Do not have high heels. Have rubber bottoms. Are comfortable and fit you well. Are closed at the toe. Do not wear sandals. If you use a stepladder: Make sure that it is fully opened. Do not climb a closed stepladder. Make sure that both sides of the stepladder are locked into place. Ask someone to hold it for you, if possible. Clearly mark and make sure that you can see: Any grab bars or handrails. First and last steps. Where the edge of each step is. Use tools that help you move around (mobility aids) if they are needed. These include: Canes. Walkers. Scooters. Crutches. Turn on the lights when you go into a dark area. Replace any light bulbs as soon as they burn out. Set up your furniture so you have a clear path. Avoid moving your furniture around. If any of your floors are uneven, fix them. If there are any pets around you, be aware of where they are. Review your medicines with your doctor. Some medicines can make you feel dizzy. This can increase your chance of falling. Ask your doctor what other things that you can do to help prevent falls. This information is not intended to replace advice given to you by your health care provider. Make sure you discuss any questions you have with your health care provider. Document Released: 07/26/2009 Document Revised: 03/06/2016 Document Reviewed: 11/03/2014 Elsevier Interactive Patient Education  2017 [employer].

## 2023-05-27 NOTE — Progress Notes (Signed)
Subjective:   [name] [name] is a 72 y.o. female who presents for Medicare Annual (Subsequent) preventive examination.  Visit Complete: Virtual  I connected with  [name] [name] on 05/27/23 by a audio enabled telemedicine application and verified that I am speaking with the correct person using two identifiers.  Patient Location: Home  Provider Location: Home Office  I discussed the limitations of evaluation and management by telemedicine. The patient expressed understanding and agreed to proceed.  Patient Medicare AWV questionnaire was completed by the patient on 05/27/2023; I have confirmed that all information answered by patient is correct and no changes since this date.  Review of Systems    Vital Signs: Unable to obtain new vitals due to this being a telehealth visit.  Cardiac Risk Factors include: advanced age (>104men, >43 women);dyslipidemia     Objective:    Today's Vitals   05/27/23 1307  Weight: 140 lb (63.5 kg)  Height: 5\' 5"  (1.651 m)   Body mass index is 23.3 kg/m.     05/27/2023    1:10 PM 05/22/2022    4:02 PM 07/17/2021    9:44 AM 05/21/2021    1:45 PM 05/16/2020    2:33 PM 05/02/2019    2:21 PM 08/22/2018    9:15 PM  Advanced Directives  Does Patient Have a Medical Advance Directive? No No No No No Yes   Type of Holiday representative of Healthcare Power of Attorney in Chart?      No - copy requested   Would patient like information on creating a medical advance directive? Yes (MAU/Ambulatory/Procedural Areas - Information given) No - Patient declined No - Patient declined No - Patient declined No - Patient declined No - Patient declined No - Patient declined    Current Medications (verified) Outpatient Encounter Medications as of 05/27/2023  Medication Sig   atorvastatin (LIPITOR) 40 MG tablet Take 1 tablet (40 mg total) by mouth daily.   Calcium Carbonate-Vitamin D (CALCIUM 500/D PO) Take by mouth.   Cholecalciferol  (VITAMIN D3) 2000 UNITS TABS Take 1 tablet by mouth daily. 5000 IU daily   clobetasol ointment (TEMOVATE) 0.05 % APPLY TO AFFECTED AREAS ON EXTERNAL VAGINA NIGHTLY AS NEEDED FOR LICHEN SCLEROSIS   escitalopram (LEXAPRO) 10 MG tablet Take 1 tablet (10 mg total) by mouth daily.   levothyroxine (SYNTHROID) 75 MCG tablet Take 1 tablet (75 mcg total) by mouth daily.   No facility-administered encounter medications on file as of 05/27/2023.    Allergies (verified) Patient has no known allergies.   History: Past Medical History:  Diagnosis Date   Allergy    Anxiety    Arthritis    mild hips - left worse   BCC (basal cell carcinoma of skin) 07/31/2016   Superficial Bcc left shoulder   Cataract    forming   GERD (gastroesophageal reflux disease)    History of cold sores    Hyperlipidemia    Osteopenia    Rosacea    SVT (supraventricular tachycardia)    past hx - no issues   Thyroid disease    hypothyroid   Past Surgical History:  Procedure Laterality Date   COLONOSCOPY     07-21-2011   skin cancer removal     tiny per pt   TUBAL LIGATION     WISDOM TOOTH EXTRACTION     Family History  Problem Relation Age of Onset   Colon polyps Mother  Glaucoma Mother    Cerebral aneurysm Mother    Macular degeneration Mother    Stroke Father    Hypertension Father    Lung cancer Father    Brain cancer Brother    Heart attack Maternal Uncle    Heart attack Paternal Uncle    Colon cancer Neg Hx    Stomach cancer Neg Hx    Esophageal cancer Neg Hx    Rectal cancer Neg Hx    Social History   Socioeconomic History   Marital status: Married    Spouse name: Roe Coombs    Number of children: 1   Years of education: 12   Highest education level: High school graduate  Occupational History   Occupation: Orthoptist: [employer]  Tobacco Use   Smoking status: Never   Smokeless tobacco: Never  Vaping Use   Vaping status: Never Used  Substance and Sexual Activity    Alcohol use: No   Drug use: No   Sexual activity: Yes    Partners: Male  Other Topics Concern   Not on file  Social History Narrative   Patient is married.  Her husband recently retired last March and they have been trying to travel more.   She has a daughter who lives locally.  She has grandchildren.  She is active in her church and tries to stay physically active.   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 years and is enjoying retirement quite a Education officer, community Strain: Low Risk  (05/27/2023)   Overall Financial Resource Strain (CARDIA)    Difficulty of Paying Living Expenses: Not hard at all  Food Insecurity: No Food Insecurity (05/27/2023)   Hunger Vital Sign    Worried About Running Out of Food in the Last Year: Never true    Ran Out of Food in the Last Year: Never true  Transportation Needs: No Transportation Needs (05/27/2023)   PRAPARE - Administrator, Civil Service (Medical): No    Lack of Transportation (Non-Medical): No  Physical Activity: Inactive (05/27/2023)   Exercise Vital Sign    Days of Exercise per Week: 0 days    Minutes of Exercise per Session: 0 min  Stress: No Stress Concern Present (05/27/2023)   [employer] of Occupational Health - Occupational Stress Questionnaire    Feeling of Stress : Not at all  Social Connections: Socially Integrated (05/27/2023)   Social Connection and Isolation Panel [NHANES]    Frequency of Communication with Friends and Family: More than three times a week    Frequency of Social Gatherings with Friends and Family: More than three times a week    Attends Religious Services: More than 4 times per year    Active Member of [employer] or Organizations: Yes    Attends Engineer, structural: More than 4 times per year    Marital Status: Married    Tobacco Counseling Counseling given:  Not Answered   Clinical Intake:  Pre-visit preparation completed: Yes  Pain : No/denies pain     Nutritional Risks: None Diabetes: No  How often do you need to have someone help you when you read instructions, pamphlets, or other written materials from your doctor or pharmacy?: 1 - Never  Interpreter Needed?: No  Information entered by :: Renie Ora, LPN  Activities of Daily Living    05/27/2023    1:10 PM  In your present state of health, do you have any difficulty performing the following activities:  Hearing? 0  Vision? 0  Difficulty concentrating or making decisions? 0  Walking or climbing stairs? 0  Dressing or bathing? 0  Doing errands, shopping? 0  Preparing Food and eating ? N  Using the Toilet? N  In the past six months, have you accidently leaked urine? N  Do you have problems with loss of bowel control? N  Managing your Medications? N  Managing your Finances? N  Housekeeping or managing your Housekeeping? N    Patient Care Team: Raliegh Ip, DO as PCP - General (Family Medicine) Sherrie George, MD as Consulting Physician (Ophthalmology) Michaelle Copas, MD as Referring Physician (Optometry) Rachael Fee, MD as Attending Physician (Gastroenterology)  Indicate any recent Medical Services you may have received from other than [facility] providers in the past year (date may be approximate).     Assessment:   This is a routine wellness examination for Shawnita.  Hearing/Vision screen Vision Screening - Comments:: Wears rx glasses - up to date with routine eye exams with  Dr.Matthews   Dietary issues and exercise activities discussed:     Goals Addressed             This Visit's Progress    Exercise 3x per week (30 min per time)         Depression Screen    05/27/2023    1:09 PM 11/21/2022   10:27 AM 05/26/2022   11:51 AM 05/22/2022    4:00 PM 11/25/2021    9:33 AM 05/22/2021    8:58 AM 05/21/2021    1:44 PM  PHQ 2/9 Scores  PHQ -  2 Score 0 0 0 0 0 0 0  PHQ- 9 Score  0         Fall Risk    05/27/2023    1:08 PM 11/21/2022   10:27 AM 05/26/2022   11:51 AM 05/22/2022    3:56 PM 11/25/2021    9:33 AM  Fall Risk   Falls in the past year? 0 0 0 0 0  Number falls in past yr: 0 0  0   Injury with Fall? 0   0   Risk for fall due to : No Fall Risks No Fall Risks  No Fall Risks   Follow up Falls prevention discussed Falls evaluation completed  Falls prevention discussed     MEDICARE RISK AT HOME:  Medicare Risk at Home - 05/27/23 1308     Any stairs in or around the home? Yes    If so, are there any without handrails? No    Home free of loose throw rugs in walkways, pet beds, electrical cords, etc? Yes    Adequate lighting in your home to reduce risk of falls? Yes    Life alert? No    Use of a cane, walker or w/c? No    Grab bars in the bathroom? Yes    Shower chair or bench in shower? Yes    Elevated toilet seat or a handicapped toilet? Yes             TIMED UP AND GO:  Was the test performed?  No    Cognitive Function:        05/27/2023    1:11 PM 05/22/2022    4:03 PM 05/16/2020  2:35 PM 05/02/2019    2:24 PM  6CIT Screen  What Year? 0 points 0 points 0 points 0 points  What month? 0 points 0 points 0 points 0 points  What time? 0 points 0 points 0 points 0 points  Count back from 20 0 points 0 points 0 points 0 points  Months in reverse 0 points 0 points 0 points 0 points  Repeat phrase 0 points 0 points 0 points 0 points  Total Score 0 points 0 points 0 points 0 points    Immunizations Immunization History  Administered Date(s) Administered   Fluad Quad(high Dose 65+) 08/09/2019   PFIZER(Purple Top)SARS-COV-2 Vaccination 06/27/2020, 07/18/2020   Pneumococcal Conjugate-13 05/02/2019   Pneumococcal Polysaccharide-23 05/31/2020   Td 05/26/2022   Tdap 04/21/2011   Zoster Recombinant(Shingrix) 10/19/2018, 04/11/2019   Zoster, Live 05/14/2011    TDAP status: Up to date  Flu Vaccine  status: Up to date  Pneumococcal vaccine status: Up to date  Covid-19 vaccine status: Completed vaccines  Qualifies for Shingles Vaccine? Yes   Zostavax completed No   Shingrix Completed?: No.    Education has been provided regarding the importance of this vaccine. Patient has been advised to call insurance company to determine out of pocket expense if they have not yet received this vaccine. Advised may also receive vaccine at local pharmacy or Health Dept. Verbalized acceptance and understanding.  Screening Tests Health Maintenance  Topic Date Due   COVID-19 Vaccine (3 - Pfizer risk series) 08/15/2020   MAMMOGRAM  05/21/2023   INFLUENZA VACCINE  05/14/2023   DEXA SCAN  12/13/2023   Medicare Annual Wellness (AWV)  05/26/2024   Colonoscopy  07/17/2024   DTaP/Tdap/Td (3 - Td or Tdap) 05/26/2032   Pneumonia Vaccine 55+ Years old  Completed   Hepatitis C Screening  Completed   Zoster Vaccines- Shingrix  Completed   HPV VACCINES  Aged Out    Health Maintenance  Health Maintenance Due  Topic Date Due   COVID-19 Vaccine (3 - Pfizer risk series) 08/15/2020   MAMMOGRAM  05/21/2023   INFLUENZA VACCINE  05/14/2023    Colorectal cancer screening: Type of screening: Colonoscopy. Completed 07/17/2021. Repeat every 3 years  Mammogram status: Completed 05/26/2023. Repeat every year  Bone Density status: Completed 12/12/2021. Results reflect: Bone density results: OSTEOPOROSIS. Repeat every 2 years.  Lung Cancer Screening: (Low Dose CT Chest recommended if Age 58-80 years, 20 pack-year currently smoking OR have quit w/in 15years.) does not qualify.   Lung Cancer Screening Referral: n/a  Additional Screening:  Hepatitis C Screening: does not qualify; Completed 10/05/2015  Vision Screening: Recommended annual ophthalmology exams for early detection of glaucoma and other disorders of the eye. Is the patient up to date with their annual eye exam?  Yes  Who is the provider or what is  the name of the office in which the patient attends annual eye exams? Dr..Matthews If pt is not established with a provider, would they like to be referred to a provider to establish care? No .   Dental Screening: Recommended annual dental exams for proper oral hygiene   Community Resource Referral / Chronic Care Management: CRR required this visit?  No   CCM required this visit?  No     Plan:     I have personally reviewed and noted the following in the patient's chart:   Medical and social history Use of alcohol, tobacco or illicit drugs  Current medications and supplements including opioid prescriptions. Patient is  not currently taking opioid prescriptions. Functional ability and status Nutritional status Physical activity Advanced directives List of other physicians Hospitalizations, surgeries, and ER visits in previous 12 months Vitals Screenings to include cognitive, depression, and falls Referrals and appointments  In addition, I have reviewed and discussed with patient certain preventive protocols, quality metrics, and best practice recommendations. A written personalized care plan for preventive services as well as general preventive health recommendations were provided to patient.     Lorrene Reid, LPN   [id number]/96/2952   After Visit Summary: (MyChart) Due to this being a telephonic visit, the after visit summary with patients personalized plan was offered to patient via MyChart   Nurse Notes: none

## 2023-06-02 ENCOUNTER — Encounter: Payer: Self-pay | Admitting: Family Medicine

## 2023-06-04 ENCOUNTER — Other Ambulatory Visit: Payer: Self-pay | Admitting: Family Medicine

## 2023-06-04 DIAGNOSIS — N904 Leukoplakia of vulva: Secondary | ICD-10-CM

## 2023-07-08 ENCOUNTER — Ambulatory Visit: Payer: HMO | Admitting: Family Medicine

## 2023-07-08 ENCOUNTER — Encounter: Payer: Self-pay | Admitting: Family Medicine

## 2023-07-08 VITALS — BP 113/76 | HR 80 | Temp 98.4°F | Ht 65.0 in | Wt 138.0 lb

## 2023-07-08 DIAGNOSIS — E7841 Elevated Lipoprotein(a): Secondary | ICD-10-CM | POA: Diagnosis not present

## 2023-07-08 DIAGNOSIS — N904 Leukoplakia of vulva: Secondary | ICD-10-CM

## 2023-07-08 DIAGNOSIS — Z23 Encounter for immunization: Secondary | ICD-10-CM

## 2023-07-08 DIAGNOSIS — E039 Hypothyroidism, unspecified: Secondary | ICD-10-CM | POA: Diagnosis not present

## 2023-07-08 DIAGNOSIS — F339 Major depressive disorder, recurrent, unspecified: Secondary | ICD-10-CM

## 2023-07-08 MED ORDER — ATORVASTATIN CALCIUM 40 MG PO TABS: 40.0000 mg | ORAL_TABLET | Freq: Every day | ORAL | 3 refills | Status: DC

## 2023-07-08 MED ORDER — ESCITALOPRAM OXALATE 10 MG PO TABS: 10.0000 mg | ORAL_TABLET | Freq: Every day | ORAL | 3 refills | Status: DC

## 2023-07-08 MED ORDER — LEVOTHYROXINE SODIUM 75 MCG PO TABS: 75.0000 ug | ORAL_TABLET | Freq: Every day | ORAL | 3 refills | Status: DC

## 2023-07-08 NOTE — Patient Instructions (Signed)
No mRNA flu vaccine that I could find.  I see they are researching that method for future flu shot seasons though.   Your vaccine is a TRIvalent vaccine like we discussed.

## 2023-07-08 NOTE — Progress Notes (Signed)
Subjective: CC: 24-month follow-up PCP: Raliegh Ip, DO [name] [name] is a 72 y.o. female presenting to clinic today for:  1.  Hypothyroidism Patient is compliant with levothyroxine 75 mcg daily.  Reports no tremor, heart palpitations, changes in bowel habits.  2.  Lichen sclerosus Patient reports very rare flareup and usually to have the clobetasol lasting her 1 entire year.  3.  Hyperlipidemia Compliant with Lipitor.  No chest pain, shortness of breath   ROS: Per HPI  No Known Allergies Past Medical History:  Diagnosis Date   Allergy    Anxiety    Arthritis    mild hips - left worse   BCC (basal cell carcinoma of skin) 07/31/2016   Superficial Bcc left shoulder   Cataract    forming   GERD (gastroesophageal reflux disease)    History of cold sores    Hyperlipidemia    Osteopenia    Rosacea    SVT (supraventricular tachycardia)    past hx - no issues   Thyroid disease    hypothyroid    Current Outpatient Medications:    atorvastatin (LIPITOR) 40 MG tablet, Take 1 tablet (40 mg total) by mouth daily., Disp: 90 tablet, Rfl: 3   Calcium Carbonate-Vitamin D (CALCIUM 500/D PO), Take by mouth., Disp: , Rfl:    Cholecalciferol (VITAMIN D3) 2000 UNITS TABS, Take 1 tablet by mouth daily. 5000 IU daily, Disp: , Rfl:    clobetasol ointment (TEMOVATE) 0.05 %, APPLY TO AFFECTED AREAS ON EXTERNAL VAGINA NIGHTLY AS NEEDED FOR LICHEN SCLEROSIS, Disp: 45 g, Rfl: 1   escitalopram (LEXAPRO) 10 MG tablet, Take 1 tablet (10 mg total) by mouth daily., Disp: 90 tablet, Rfl: 3   levothyroxine (SYNTHROID) 75 MCG tablet, Take 1 tablet (75 mcg total) by mouth daily., Disp: 90 tablet, Rfl: 3 Social History   Socioeconomic History   Marital status: Married    Spouse name: Roe Coombs    Number of children: 1   Years of education: 12   Highest education level: High school graduate  Occupational History   Occupation: Orthoptist: [employer]  Tobacco Use    Smoking status: Never   Smokeless tobacco: Never  Vaping Use   Vaping status: Never Used  Substance and Sexual Activity   Alcohol use: No   Drug use: No   Sexual activity: Yes    Partners: Male  Other Topics Concern   Not on file  Social History Narrative   Patient is married.  Her husband recently retired last March and they have been trying to travel more.   She has a daughter who lives locally.  She has grandchildren.  She is active in her church and tries to stay physically active.   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 years and is enjoying retirement quite a Education officer, community Strain: Low Risk  (05/27/2023)   Overall Financial Resource Strain (CARDIA)    Difficulty of Paying Living Expenses: Not hard at all  Food Insecurity: No Food Insecurity (05/27/2023)   Hunger Vital Sign    Worried About Running Out of Food in the Last Year: Never true    Ran Out of Food in the Last Year: Never true  Transportation Needs: No Transportation Needs (05/27/2023)   PRAPARE - Transportation    Lack  of Transportation (Medical): No    Lack of Transportation (Non-Medical): No  Physical Activity: Inactive (05/27/2023)   Exercise Vital Sign    Days of Exercise per Week: 0 days    Minutes of Exercise per Session: 0 min  Stress: No Stress Concern Present (05/27/2023)   [name] of Occupational Health - Occupational Stress Questionnaire    Feeling of Stress : Not at all  Social Connections: Socially Integrated (05/27/2023)   Social Connection and Isolation Panel [NHANES]    Frequency of Communication with Friends and Family: More than three times a week    Frequency of Social Gatherings with Friends and Family: More than three times a week    Attends Religious Services: More than 4 times per year    Active Member of [employer] or Organizations: Yes    Attends Museum/gallery exhibitions officer: More than 4 times per year    Marital Status: Married  Catering manager Violence: Not At Risk (05/27/2023)   Humiliation, Afraid, Rape, and Kick questionnaire    Fear of Current or Ex-Partner: No    Emotionally Abused: No    Physically Abused: No    Sexually Abused: No   Family History  Problem Relation Age of Onset   Colon polyps Mother    Glaucoma Mother    Cerebral aneurysm Mother    Macular degeneration Mother    Stroke Father    Hypertension Father    Lung cancer Father    Brain cancer Brother    Heart attack Maternal Uncle    Heart attack Paternal Uncle    Colon cancer Neg Hx    Stomach cancer Neg Hx    Esophageal cancer Neg Hx    Rectal cancer Neg Hx     Objective: Office vital signs reviewed. BP 113/76   Pulse 80   Temp 98.4 F (36.9 C)   Ht 5\' 5"  (1.651 m)   Wt 138 lb (62.6 kg)   SpO2 95%   BMI 22.96 kg/m   Physical Examination:  General: Awake, alert, well nourished, No acute distress HEENT: sclera white, MMM.  No exophthalmos Cardio: regular rate and rhythm, S1S2 heard, no murmurs appreciated Pulm: clear to auscultation bilaterally, no wheezes, rhonchi or rales; normal work of breathing on room air     07/08/2023    9:55 AM 05/27/2023    1:09 PM 11/21/2022   10:27 AM  Depression screen PHQ 2/9  Decreased Interest 0 0 0  Down, Depressed, Hopeless 0 0 0  PHQ - 2 Score 0 0 0  Altered sleeping 0  0  Tired, decreased energy 0  0  Change in appetite 0  0  Feeling bad or failure about yourself  0  0  Trouble concentrating 0  0  Moving slowly or fidgety/restless 0  0  Suicidal thoughts 0  0  PHQ-9 Score 0  0  Difficult doing work/chores   Not difficult at all      07/08/2023    9:55 AM 11/21/2022   10:27 AM 05/26/2022   11:51 AM 11/25/2021    9:33 AM  GAD 7 : Generalized Anxiety Score  Nervous, Anxious, on Edge 0 0 1 0  Control/stop worrying 0 0 0 0  Worry too much - different things 0 0 1 0  Trouble relaxing 0 0 0 0  Restless  0 0 0 0  Easily annoyed or irritable 0 0 1 0  Afraid - awful might happen 0 0 1 0  Total GAD 7 Score 0 0 4 0  Anxiety Difficulty Not difficult at all Not difficult at all Not difficult at all Not difficult at all     Assessment/ Plan: 72 y.o. female   Hypothyroidism (acquired) - Plan: TSH, T4, Free, levothyroxine (SYNTHROID) 75 MCG tablet  Elevated lipoprotein(a) - Plan: atorvastatin (LIPITOR) 40 MG tablet  Lichen sclerosus of vulva  Depression, recurrent (HCC) - Plan: escitalopram (LEXAPRO) 10 MG tablet  Asymptomatic from a thyroid standpoint.  Check thyroid levels.  Synthroid renewed as she will run out prior to our next scheduled visit  Lipitor renewed.  Not yet due for fasting lipid  Lichen sclerosus is chronic and stable with clobetasol.  This was renewed last month and she does not need a refill  Depression stable with Lexapro.  Continue current regimen  Influenza vaccination administered   Raliegh Ip, DO [facility] [phone]

## 2023-07-09 LAB — TSH: TSH: 0.566 u[IU]/mL (ref 0.450–4.500)

## 2023-07-09 LAB — T4, FREE: Free T4: 1.68 ng/dL (ref 0.82–1.77)

## 2023-09-24 ENCOUNTER — Encounter (INDEPENDENT_AMBULATORY_CARE_PROVIDER_SITE_OTHER): Payer: HMO | Admitting: Ophthalmology

## 2023-09-24 DIAGNOSIS — H35372 Puckering of macula, left eye: Secondary | ICD-10-CM | POA: Diagnosis not present

## 2023-09-24 DIAGNOSIS — H33303 Unspecified retinal break, bilateral: Secondary | ICD-10-CM

## 2023-09-24 DIAGNOSIS — H43813 Vitreous degeneration, bilateral: Secondary | ICD-10-CM

## 2023-12-09 ENCOUNTER — Encounter: Payer: Self-pay | Admitting: Family Medicine

## 2023-12-09 ENCOUNTER — Ambulatory Visit (INDEPENDENT_AMBULATORY_CARE_PROVIDER_SITE_OTHER): Payer: HMO | Admitting: Family Medicine

## 2023-12-09 DIAGNOSIS — R300 Dysuria: Secondary | ICD-10-CM | POA: Diagnosis not present

## 2023-12-09 LAB — URINALYSIS, ROUTINE W REFLEX MICROSCOPIC
Bilirubin, UA: NEGATIVE
Glucose, UA: NEGATIVE
Ketones, UA: NEGATIVE
Nitrite, UA: NEGATIVE
RBC, UA: NEGATIVE
Specific Gravity, UA: 1.025 (ref 1.005–1.030)
Urobilinogen, Ur: 0.2 mg/dL (ref 0.2–1.0)
pH, UA: 5 (ref 5.0–7.5)

## 2023-12-09 LAB — MICROSCOPIC EXAMINATION
RBC, Urine: NONE SEEN /HPF (ref 0–2)
Renal Epithel, UA: NONE SEEN /HPF
WBC, UA: >30 /HPF — AB (ref 0–5)
Yeast, UA: NONE SEEN

## 2023-12-09 MED ORDER — SULFAMETHOXAZOLE-TRIMETHOPRIM 800-160 MG PO TABS
1.0000 | ORAL_TABLET | Freq: Two times a day (BID) | ORAL | 0 refills | Status: DC
Start: 2023-12-09 — End: 2024-01-11

## 2023-12-09 NOTE — Progress Notes (Signed)
 Subjective:  Patient ID: [name] [name], female    DOB: Jan 02, 1951, 73 y.o.   MRN: [id number]  Patient Care Team: Raliegh Ip, DO as PCP - General (Family Medicine) Sherrie George, MD as Consulting Physician (Ophthalmology) Michaelle Copas, MD as Referring Physician (Optometry) Rachael Fee, MD (Inactive) as Attending Physician (Gastroenterology)   Chief Complaint:  Dysuria  HPI: [name] [name] is a 73 y.o. female presenting on 12/09/2023 for Dysuria  Dysuria    1. Dysuria States that symptoms started 3 days ago. Reports urgency, then hesitancy. Endorses burning sensation with urination. States that she tried azo and that helps some, then it returns. Denies fever, N/V, low back pain, itching, and discharge.   Relevant past medical, surgical, family, and social history reviewed and updated as indicated.  Allergies and medications reviewed and updated. Data reviewed: Chart in Epic.   Past Medical History:  Diagnosis Date   Allergy    Anxiety    Arthritis    mild hips - left worse   BCC (basal cell carcinoma of skin) 07/31/2016   Superficial Bcc left shoulder   Cataract    forming   GERD (gastroesophageal reflux disease)    History of cold sores    Hyperlipidemia    Osteopenia    Rosacea    SVT (supraventricular tachycardia) (HCC)    past hx - no issues   Thyroid disease    hypothyroid    Past Surgical History:  Procedure Laterality Date   COLONOSCOPY     07-21-2011   skin cancer removal     tiny per pt   TUBAL LIGATION     WISDOM TOOTH EXTRACTION      Social History   Socioeconomic History   Marital status: Married    Spouse name: Roe Coombs    Number of children: 1   Years of education: 12   Highest education level: High school graduate  Occupational History   Occupation: Orthoptist: [employer]  Tobacco Use   Smoking status: Never   Smokeless tobacco: Never  Vaping Use   Vaping status: Never Used  Substance and  Sexual Activity   Alcohol use: No   Drug use: No   Sexual activity: Yes    Partners: Male  Other Topics Concern   Not on file  Social History Narrative   Patient is married.  Her husband recently retired last March and they have been trying to travel more.   She has a daughter who lives locally.  She has grandchildren.  She is active in her church and tries to stay physically active.   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 years and is enjoying retirement quite a bit   Social Drivers of Corporate investment banker Strain: Low Risk  (05/27/2023)   Overall Financial Resource Strain (CARDIA)    Difficulty of Paying Living Expenses: Not hard at all  Food Insecurity: No Food Insecurity (05/27/2023)   Hunger Vital Sign    Worried About Running Out of Food in the Last Year: Never true    Ran Out of Food in the Last Year: Never true  Transportation Needs: No Transportation Needs (05/27/2023)   PRAPARE - Administrator, Civil Service (Medical): No    Lack of Transportation (Non-Medical): No  Physical Activity: Inactive (05/27/2023)  Exercise Vital Sign    Days of Exercise per Week: 0 days    Minutes of Exercise per Session: 0 min  Stress: No Stress Concern Present (05/27/2023)   [employer] of Occupational Health - Occupational Stress Questionnaire    Feeling of Stress : Not at all  Social Connections: Socially Integrated (05/27/2023)   Social Connection and Isolation Panel [NHANES]    Frequency of Communication with Friends and Family: More than three times a week    Frequency of Social Gatherings with Friends and Family: More than three times a week    Attends Religious Services: More than 4 times per year    Active Member of [employer] or Organizations: Yes    Attends Engineer, structural: More than 4 times per year    Marital Status: Married  Catering manager Violence: Not At  Risk (05/27/2023)   Humiliation, Afraid, Rape, and Kick questionnaire    Fear of Current or Ex-Partner: No    Emotionally Abused: No    Physically Abused: No    Sexually Abused: No    Outpatient Encounter Medications as of 12/09/2023  Medication Sig   atorvastatin (LIPITOR) 40 MG tablet Take 1 tablet (40 mg total) by mouth daily.   Calcium Carbonate-Vitamin D (CALCIUM 500/D PO) Take by mouth.   Cholecalciferol (VITAMIN D3) 2000 UNITS TABS Take 1 tablet by mouth daily. 5000 IU daily   clobetasol ointment (TEMOVATE) 0.05 % APPLY TO AFFECTED AREAS ON EXTERNAL VAGINA NIGHTLY AS NEEDED FOR LICHEN SCLEROSIS   escitalopram (LEXAPRO) 10 MG tablet Take 1 tablet (10 mg total) by mouth daily.   levothyroxine (SYNTHROID) 75 MCG tablet Take 1 tablet (75 mcg total) by mouth daily.   No facility-administered encounter medications on file as of 12/09/2023.    No Known Allergies  Review of Systems  Genitourinary:  Positive for dysuria.   Objective:  BP 121/76   Pulse 89   Temp 98 F (36.7 C)   Ht 5\' 5"  (1.651 m)   Wt 139 lb (63 kg)   SpO2 97%   BMI 23.13 kg/m    Wt Readings from Last 3 Encounters:  12/09/23 139 lb (63 kg)  07/08/23 138 lb (62.6 kg)  05/27/23 140 lb (63.5 kg)   Physical Exam Constitutional:      General: She is awake. She is not in acute distress.    Appearance: Normal appearance. She is well-developed and well-groomed. She is not ill-appearing, toxic-appearing or diaphoretic.  Cardiovascular:     Rate and Rhythm: Normal rate and regular rhythm.     Pulses: Normal pulses.          Radial pulses are 2+ on the right side and 2+ on the left side.       Posterior tibial pulses are 2+ on the right side and 2+ on the left side.     Heart sounds: Normal heart sounds. No murmur heard.    No gallop.  Pulmonary:     Effort: Pulmonary effort is normal. No respiratory distress.     Breath sounds: Normal breath sounds. No stridor. No wheezing, rhonchi or rales.  Abdominal:      General: Abdomen is flat. Bowel sounds are normal. There is no distension.     Palpations: Abdomen is soft. There is no mass.     Tenderness: There is no abdominal tenderness. There is no right CVA tenderness, left CVA tenderness, guarding or rebound.     Hernia: No hernia is present.  Musculoskeletal:  Cervical back: Full passive range of motion without pain and neck supple.     Right lower leg: No edema.     Left lower leg: No edema.  Skin:    General: Skin is warm.     Capillary Refill: Capillary refill takes less than 2 seconds.  Neurological:     General: No focal deficit present.     Mental Status: She is alert, oriented to person, place, and time and easily aroused. Mental status is at baseline.     GCS: GCS eye subscore is 4. GCS verbal subscore is 5. GCS motor subscore is 6.     Motor: No weakness.  Psychiatric:        Attention and Perception: Attention and perception normal.        Mood and Affect: Mood and affect normal.        Speech: Speech normal.        Behavior: Behavior normal. Behavior is cooperative.        Thought Content: Thought content normal. Thought content does not include homicidal or suicidal ideation. Thought content does not include homicidal or suicidal plan.        Cognition and Memory: Cognition and memory normal.        Judgment: Judgment normal.    Results for orders placed or performed in visit on 07/08/23  TSH   Collection Time: 07/08/23 10:36 AM  Result Value Ref Range   TSH 0.566 0.450 - 4.500 uIU/mL  T4, Free   Collection Time: 07/08/23 10:36 AM  Result Value Ref Range   Free T4 1.68 0.82 - 1.77 ng/dL       1/61/0960    4:54 AM 07/08/2023    9:55 AM 05/27/2023    1:09 PM 11/21/2022   10:27 AM 05/26/2022   11:51 AM  Depression screen PHQ 2/9  Decreased Interest 0 0 0 0 0  Down, Depressed, Hopeless 0 0 0 0 0  PHQ - 2 Score 0 0 0 0 0  Altered sleeping  0  0   Tired, decreased energy  0  0   Change in appetite  0  0   Feeling bad  or failure about yourself   0  0   Trouble concentrating  0  0   Moving slowly or fidgety/restless  0  0   Suicidal thoughts  0  0   PHQ-9 Score  0  0   Difficult doing work/chores    Not difficult at all        12/09/2023    9:48 AM 07/08/2023    9:55 AM 11/21/2022   10:27 AM 05/26/2022   11:51 AM  GAD 7 : Generalized Anxiety Score  Nervous, Anxious, on Edge 0 0 0 1  Control/stop worrying 0 0 0 0  Worry too much - different things 0 0 0 1  Trouble relaxing 0 0 0 0  Restless 0 0 0 0  Easily annoyed or irritable 0 0 0 1  Afraid - awful might happen 0 0 0 1  Total GAD 7 Score 0 0 0 4  Anxiety Difficulty  Not difficult at all Not difficult at all Not difficult at all      Pertinent labs & imaging results that were available during my care of the patient were reviewed by me and considered in my medical decision making.  Assessment & Plan:  [name] [name] was seen today for dysuria.  Diagnoses and all orders for this visit:  Dysuria Based on UA and symptoms will start abx as below. Will await results of culture to determine next steps.  -     Urinalysis, Routine w reflex microscopic -     Urine Culture -     sulfamethoxazole-trimethoprim (BACTRIM DS) 800-160 MG tablet; Take 1 tablet by mouth 2 (two) times daily.  Continue all other maintenance medications.  Follow up plan: Return if symptoms worsen or fail to improve.   Continue healthy lifestyle choices, including diet (rich in fruits, vegetables, and lean proteins, and low in salt and simple carbohydrates) and exercise (at least 30 minutes of moderate physical activity daily).  Written and verbal instructions provided   The above assessment and management plan was discussed with the patient. The patient verbalized understanding of and has agreed to the management plan. Patient is aware to call the clinic if they develop any new symptoms or if symptoms persist or worsen. Patient is aware when to return to the clinic for a follow-up  visit. Patient educated on when it is appropriate to go to the emergency department.   Neale Burly, DNP-FNP [facility] [address] [place], [place] [address] [phone]

## 2023-12-13 LAB — URINE CULTURE

## 2023-12-15 ENCOUNTER — Encounter: Payer: Self-pay | Admitting: Family Medicine

## 2023-12-15 NOTE — Progress Notes (Signed)
 Positive UTI. On appropriate abx. Follow up if symptoms continue.

## 2024-01-11 ENCOUNTER — Encounter: Payer: Self-pay | Admitting: Family Medicine

## 2024-01-11 ENCOUNTER — Ambulatory Visit (INDEPENDENT_AMBULATORY_CARE_PROVIDER_SITE_OTHER): Payer: HMO | Admitting: Family Medicine

## 2024-01-11 VITALS — BP 114/72 | HR 77 | Temp 97.9°F | Ht 65.0 in | Wt 137.6 lb

## 2024-01-11 DIAGNOSIS — R7989 Other specified abnormal findings of blood chemistry: Secondary | ICD-10-CM | POA: Diagnosis not present

## 2024-01-11 DIAGNOSIS — F339 Major depressive disorder, recurrent, unspecified: Secondary | ICD-10-CM | POA: Diagnosis not present

## 2024-01-11 DIAGNOSIS — E039 Hypothyroidism, unspecified: Secondary | ICD-10-CM

## 2024-01-11 DIAGNOSIS — N904 Leukoplakia of vulva: Secondary | ICD-10-CM | POA: Diagnosis not present

## 2024-01-11 DIAGNOSIS — E7841 Elevated Lipoprotein(a): Secondary | ICD-10-CM

## 2024-01-11 DIAGNOSIS — N390 Urinary tract infection, site not specified: Secondary | ICD-10-CM

## 2024-01-11 DIAGNOSIS — R300 Dysuria: Secondary | ICD-10-CM | POA: Diagnosis not present

## 2024-01-11 LAB — URINALYSIS, ROUTINE W REFLEX MICROSCOPIC
Bilirubin, UA: NEGATIVE
Glucose, UA: NEGATIVE
Nitrite, UA: NEGATIVE
Protein,UA: NEGATIVE
RBC, UA: NEGATIVE
Specific Gravity, UA: 1.025 (ref 1.005–1.030)
Urobilinogen, Ur: 1 mg/dL (ref 0.2–1.0)
pH, UA: 5.5 (ref 5.0–7.5)

## 2024-01-11 LAB — MICROSCOPIC EXAMINATION
RBC, Urine: NONE SEEN /HPF (ref 0–2)
Renal Epithel, UA: NONE SEEN /HPF
Yeast, UA: NONE SEEN

## 2024-01-11 MED ORDER — ATORVASTATIN CALCIUM 40 MG PO TABS: 40.0000 mg | ORAL_TABLET | Freq: Every day | ORAL | 3 refills | Status: DC

## 2024-01-11 MED ORDER — LEVOTHYROXINE SODIUM 75 MCG PO TABS: 75.0000 ug | ORAL_TABLET | Freq: Every day | ORAL | 3 refills | Status: DC

## 2024-01-11 MED ORDER — CLOBETASOL PROPIONATE 0.05 % EX OINT: TOPICAL_OINTMENT | CUTANEOUS | 1 refills | Status: AC

## 2024-01-11 MED ORDER — CEPHALEXIN 500 MG PO CAPS: 500.0000 mg | ORAL_CAPSULE | Freq: Two times a day (BID) | ORAL | 0 refills | Status: AC

## 2024-01-11 MED ORDER — ESCITALOPRAM OXALATE 10 MG PO TABS: 10.0000 mg | ORAL_TABLET | Freq: Every day | ORAL | 3 refills | Status: AC

## 2024-01-11 NOTE — Progress Notes (Unsigned)
 Subjective: CC:*** PCP: Raliegh Ip, DO ZOX:WRUEAV Eads is a 73 y.o. female presenting to clinic today for:  1. ***   ROS: Per HPI  No Known Allergies Past Medical History:  Diagnosis Date   Allergy    Anxiety    Arthritis    mild hips - left worse   BCC (basal cell carcinoma of skin) 07/31/2016   Superficial Bcc left shoulder   Cataract    forming   GERD (gastroesophageal reflux disease)    History of cold sores    Hyperlipidemia    Osteopenia    Rosacea    SVT (supraventricular tachycardia) (HCC)    past hx - no issues   Thyroid disease    hypothyroid    Current Outpatient Medications:    atorvastatin (LIPITOR) 40 MG tablet, Take 1 tablet (40 mg total) by mouth daily., Disp: 90 tablet, Rfl: 3   Calcium Carbonate-Vitamin D (CALCIUM 500/D PO), Take by mouth., Disp: , Rfl:    Cholecalciferol (VITAMIN D3) 2000 UNITS TABS, Take 1 tablet by mouth daily. 5000 IU daily, Disp: , Rfl:    clobetasol ointment (TEMOVATE) 0.05 %, APPLY TO AFFECTED AREAS ON EXTERNAL VAGINA NIGHTLY AS NEEDED FOR LICHEN SCLEROSIS, Disp: 45 g, Rfl: 1   escitalopram (LEXAPRO) 10 MG tablet, Take 1 tablet (10 mg total) by mouth daily., Disp: 90 tablet, Rfl: 3   levothyroxine (SYNTHROID) 75 MCG tablet, Take 1 tablet (75 mcg total) by mouth daily., Disp: 90 tablet, Rfl: 3 Social History   Socioeconomic History   Marital status: Married    Spouse name: Roe Coombs    Number of children: 1   Years of education: 12   Highest education level: High school graduate  Occupational History   Occupation: Orthoptist: [employer]  Tobacco Use   Smoking status: Never   Smokeless tobacco: Never  Vaping Use   Vaping status: Never Used  Substance and Sexual Activity   Alcohol use: No   Drug use: No   Sexual activity: Yes    Partners: Male  Other Topics Concern   Not on file  Social History Narrative   Patient is married.  Her husband recently retired last March and they have  been trying to travel more.   She has a daughter who lives locally.  She has grandchildren.  She is active in her church and tries to stay physically active.   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 years and is enjoying retirement quite a bit   Social Drivers of Corporate investment banker Strain: Low Risk  (05/27/2023)   Overall Financial Resource Strain (CARDIA)    Difficulty of Paying Living Expenses: Not hard at all  Food Insecurity: No Food Insecurity (05/27/2023)   Hunger Vital Sign    Worried About Running Out of Food in the Last Year: Never true    Ran Out of Food in the Last Year: Never true  Transportation Needs: No Transportation Needs (05/27/2023)   PRAPARE - Administrator, Civil Service (Medical): No    Lack of Transportation (Non-Medical): No  Physical Activity: Inactive (05/27/2023)   Exercise Vital Sign    Days of Exercise per Week: 0 days    Minutes of Exercise per Session: 0 min  Stress: No Stress Concern Present (05/27/2023)   [employer] of Occupational  Health - Occupational Stress Questionnaire    Feeling of Stress : Not at all  Social Connections: Socially Integrated (05/27/2023)   Social Connection and Isolation Panel [NHANES]    Frequency of Communication with Friends and Family: More than three times a week    Frequency of Social Gatherings with Friends and Family: More than three times a week    Attends Religious Services: More than 4 times per year    Active Member of [employer] or Organizations: Yes    Attends Engineer, structural: More than 4 times per year    Marital Status: Married  Catering manager Violence: Not At Risk (05/27/2023)   Humiliation, Afraid, Rape, and Kick questionnaire    Fear of Current or Ex-Partner: No    Emotionally Abused: No    Physically Abused: No    Sexually Abused: No   Family History  Problem Relation Age of Onset    Colon polyps Mother    Glaucoma Mother    Cerebral aneurysm Mother    Macular degeneration Mother    Stroke Father    Hypertension Father    Lung cancer Father    Brain cancer Brother    Heart attack Maternal Uncle    Heart attack Paternal Uncle    Colon cancer Neg Hx    Stomach cancer Neg Hx    Esophageal cancer Neg Hx    Rectal cancer Neg Hx     Objective: Office vital signs reviewed. BP 114/72   Pulse 77   Temp 97.9 F (36.6 C)   Ht 5\' 5"  (1.651 m)   Wt 137 lb 9.6 oz (62.4 kg)   SpO2 97%   BMI 22.90 kg/m   Physical Examination:  General: Awake, alert, *** nourished, No acute distress HEENT: Normal    Neck: No masses palpated. No lymphadenopathy    Ears: Tympanic membranes intact, normal light reflex, no erythema, no bulging    Eyes: PERRLA, extraocular membranes intact, sclera ***    Nose: nasal turbinates moist, *** nasal discharge    Throat: moist mucus membranes, no erythema, *** tonsillar exudate.  Airway is patent Cardio: regular rate and rhythm, S1S2 heard, no murmurs appreciated Pulm: clear to auscultation bilaterally, no wheezes, rhonchi or rales; normal work of breathing on room air GI: soft, non-tender, non-distended, bowel sounds present x4, no hepatomegaly, no splenomegaly, no masses GU: external vaginal tissue ***, cervix ***, *** punctate lesions on cervix appreciated, *** discharge from cervical os, *** bleeding, *** cervical motion tenderness, *** abdominal/ adnexal masses Extremities: warm, well perfused, No edema, cyanosis or clubbing; +*** pulses bilaterally MSK: *** gait and *** station Skin: dry; intact; no rashes or lesions Neuro: *** Strength and light touch sensation grossly intact, *** DTRs ***/4  Assessment/ Plan: 73 y.o. female   Dysuria - Plan: Urine Culture, Urinalysis, Routine w reflex microscopic  Hypothyroidism (acquired)  ***   Shomari Matusik Hulen Skains, DO [facility] [phone]

## 2024-01-12 ENCOUNTER — Other Ambulatory Visit

## 2024-01-12 DIAGNOSIS — N904 Leukoplakia of vulva: Secondary | ICD-10-CM | POA: Diagnosis not present

## 2024-01-12 DIAGNOSIS — N390 Urinary tract infection, site not specified: Secondary | ICD-10-CM | POA: Diagnosis not present

## 2024-01-12 DIAGNOSIS — R7989 Other specified abnormal findings of blood chemistry: Secondary | ICD-10-CM

## 2024-01-12 DIAGNOSIS — F339 Major depressive disorder, recurrent, unspecified: Secondary | ICD-10-CM

## 2024-01-12 DIAGNOSIS — E039 Hypothyroidism, unspecified: Secondary | ICD-10-CM | POA: Diagnosis not present

## 2024-01-12 DIAGNOSIS — E7841 Elevated Lipoprotein(a): Secondary | ICD-10-CM | POA: Diagnosis not present

## 2024-01-13 ENCOUNTER — Encounter: Payer: Self-pay | Admitting: Family Medicine

## 2024-01-13 ENCOUNTER — Other Ambulatory Visit: Payer: Self-pay | Admitting: Family Medicine

## 2024-01-13 DIAGNOSIS — R7989 Other specified abnormal findings of blood chemistry: Secondary | ICD-10-CM

## 2024-01-13 LAB — VITAMIN D 25 HYDROXY (VIT D DEFICIENCY, FRACTURES): Vit D, 25-Hydroxy: 51.7 ng/mL (ref 30.0–100.0)

## 2024-01-13 LAB — CMP14+EGFR
ALT: 235 IU/L — ABNORMAL HIGH (ref 0–32)
AST: 154 IU/L — ABNORMAL HIGH (ref 0–40)
Albumin: 3.9 g/dL (ref 3.9–4.9)
Alkaline Phosphatase: 173 IU/L — ABNORMAL HIGH (ref 44–121)
BUN/Creatinine Ratio: 14 (ref 12–28)
BUN: 14 mg/dL (ref 8–27)
Bilirubin Total: 0.5 mg/dL (ref 0.0–1.2)
CO2: 24 mmol/L (ref 20–29)
Calcium: 9.3 mg/dL (ref 8.7–10.3)
Chloride: 103 mmol/L (ref 96–106)
Creatinine, Ser: 0.98 mg/dL (ref 0.57–1.00)
Globulin, Total: 2.4 g/dL (ref 1.5–4.5)
Glucose: 90 mg/dL (ref 70–99)
Potassium: 4.3 mmol/L (ref 3.5–5.2)
Sodium: 140 mmol/L (ref 134–144)
Total Protein: 6.3 g/dL (ref 6.0–8.5)
eGFR: 62 mL/min/{1.73_m2} (ref >59)

## 2024-01-13 LAB — LIPID PANEL
Chol/HDL Ratio: 2.4 ratio (ref 0.0–4.4)
Cholesterol, Total: 117 mg/dL (ref 100–199)
HDL: 48 mg/dL (ref >39)
LDL Chol Calc (NIH): 50 mg/dL (ref 0–99)
Triglycerides: 100 mg/dL (ref 0–149)
VLDL Cholesterol Cal: 19 mg/dL (ref 5–40)

## 2024-01-13 LAB — TSH+FREE T4
Free T4: 1.78 ng/dL — ABNORMAL HIGH (ref 0.82–1.77)
TSH: 0.405 u[IU]/mL — ABNORMAL LOW (ref 0.450–4.500)

## 2024-01-14 ENCOUNTER — Telehealth: Payer: Self-pay | Admitting: Family Medicine

## 2024-01-14 ENCOUNTER — Other Ambulatory Visit

## 2024-01-14 DIAGNOSIS — R7989 Other specified abnormal findings of blood chemistry: Secondary | ICD-10-CM | POA: Diagnosis not present

## 2024-01-14 LAB — URINE CULTURE

## 2024-01-14 NOTE — Telephone Encounter (Signed)
 Please order future labs for --repeat thyroid levels in 6 weeks.  Pt schedule an apt for 02/25/2024 at 10:00.

## 2024-01-15 ENCOUNTER — Ambulatory Visit (HOSPITAL_BASED_OUTPATIENT_CLINIC_OR_DEPARTMENT_OTHER)
Admission: RE | Admit: 2024-01-15 | Discharge: 2024-01-15 | Disposition: A | Discharge status: Home / Self Care | Source: Ambulatory Visit | Attending: Family Medicine | Admitting: Family Medicine

## 2024-01-15 ENCOUNTER — Encounter: Payer: Self-pay | Admitting: Family Medicine

## 2024-01-15 ENCOUNTER — Other Ambulatory Visit: Payer: Self-pay | Admitting: Family Medicine

## 2024-01-15 DIAGNOSIS — R7989 Other specified abnormal findings of blood chemistry: Secondary | ICD-10-CM | POA: Insufficient documentation

## 2024-01-15 LAB — HEPATIC FUNCTION PANEL
ALT: 245 IU/L — ABNORMAL HIGH (ref 0–32)
AST: 161 IU/L — ABNORMAL HIGH (ref 0–40)
Albumin: 4.1 g/dL (ref 3.9–4.9)
Alkaline Phosphatase: 170 IU/L — ABNORMAL HIGH (ref 44–121)
Bilirubin Total: 0.5 mg/dL (ref 0.0–1.2)
Bilirubin, Direct: 0.2 mg/dL (ref 0.00–0.40)
Total Protein: 6.3 g/dL (ref 6.0–8.5)

## 2024-01-15 LAB — ACUTE HEP PANEL AND HEP B SURFACE AB
Hep A IgM: NEGATIVE
Hep B C IgM: NEGATIVE
Hep C Virus Ab: NONREACTIVE
Hepatitis B Surf Ab Quant: <3.5 m[IU]/mL — ABNORMAL LOW
Hepatitis B Surface Ag: NEGATIVE

## 2024-01-18 ENCOUNTER — Telehealth: Payer: Self-pay | Admitting: Family Medicine

## 2024-01-18 ENCOUNTER — Other Ambulatory Visit: Payer: Self-pay | Admitting: Family Medicine

## 2024-01-18 DIAGNOSIS — R7989 Other specified abnormal findings of blood chemistry: Secondary | ICD-10-CM

## 2024-01-18 NOTE — Telephone Encounter (Signed)
 Spoke with pt , got her lab apt made to come in for repeat

## 2024-01-18 NOTE — Telephone Encounter (Signed)
 Copied from CRM [id number]. Topic: General - Call Back - No Documentation >> Jan 18, 2024  9:35 AM Higinio Roger wrote: Reason for CRM: Patient is requesting a call back from Delynn Flavin DO due to having an ultrasound and discovering she has high liver levels  Callback #: [phone]

## 2024-01-19 ENCOUNTER — Other Ambulatory Visit

## 2024-01-19 DIAGNOSIS — R7989 Other specified abnormal findings of blood chemistry: Secondary | ICD-10-CM | POA: Diagnosis not present

## 2024-01-20 ENCOUNTER — Encounter: Payer: Self-pay | Admitting: Family Medicine

## 2024-01-20 LAB — HEPATIC FUNCTION PANEL
ALT: 233 IU/L — ABNORMAL HIGH (ref 0–32)
AST: 131 IU/L — ABNORMAL HIGH (ref 0–40)
Albumin: 4 g/dL (ref 3.9–4.9)
Alkaline Phosphatase: 187 IU/L — ABNORMAL HIGH (ref 44–121)
Bilirubin Total: 0.6 mg/dL (ref 0.0–1.2)
Bilirubin, Direct: 0.26 mg/dL (ref 0.00–0.40)
Total Protein: 6.4 g/dL (ref 6.0–8.5)

## 2024-01-26 ENCOUNTER — Other Ambulatory Visit (INDEPENDENT_AMBULATORY_CARE_PROVIDER_SITE_OTHER)

## 2024-01-26 ENCOUNTER — Encounter: Payer: Self-pay | Admitting: Gastroenterology

## 2024-01-26 ENCOUNTER — Ambulatory Visit: Admitting: Gastroenterology

## 2024-01-26 VITALS — BP 112/78 | HR 76 | Ht 65.0 in | Wt 138.2 lb

## 2024-01-26 DIAGNOSIS — R748 Abnormal levels of other serum enzymes: Secondary | ICD-10-CM

## 2024-01-26 LAB — IBC + FERRITIN
Ferritin: 40.4 ng/mL (ref 10.0–291.0)
Iron: 126 ug/dL (ref 42–145)
Saturation Ratios: 34.9 % (ref 20.0–50.0)
TIBC: 361.2 ug/dL (ref 250.0–450.0)
Transferrin: 258 mg/dL (ref 212.0–360.0)

## 2024-01-26 LAB — CBC WITH DIFFERENTIAL/PLATELET
Basophils Absolute: 0.1 10*3/uL (ref 0.0–0.1)
Basophils Relative: 1 % (ref 0.0–3.0)
Eosinophils Absolute: 0.2 10*3/uL (ref 0.0–0.7)
Eosinophils Relative: 2.4 % (ref 0.0–5.0)
HCT: 41.3 % (ref 36.0–46.0)
Hemoglobin: 13.8 g/dL (ref 12.0–15.0)
Lymphocytes Relative: 26 % (ref 12.0–46.0)
Lymphs Abs: 1.9 10*3/uL (ref 0.7–4.0)
MCHC: 33.4 g/dL (ref 30.0–36.0)
MCV: 88.1 fl (ref 78.0–100.0)
Monocytes Absolute: 0.8 10*3/uL (ref 0.1–1.0)
Monocytes Relative: 11.9 % (ref 3.0–12.0)
Neutro Abs: 4.2 10*3/uL (ref 1.4–7.7)
Neutrophils Relative %: 58.7 % (ref 43.0–77.0)
Platelets: 305 10*3/uL (ref 150.0–400.0)
RBC: 4.68 Mil/uL (ref 3.87–5.11)
RDW: 13.6 % (ref 11.5–15.5)
WBC: 7.1 10*3/uL (ref 4.0–10.5)

## 2024-01-26 LAB — HEPATIC FUNCTION PANEL
ALT: 186 U/L — ABNORMAL HIGH (ref 0–35)
AST: 109 U/L — ABNORMAL HIGH (ref 0–37)
Albumin: 4.4 g/dL (ref 3.5–5.2)
Alkaline Phosphatase: 171 U/L — ABNORMAL HIGH (ref 39–117)
Bilirubin, Direct: 0.3 mg/dL (ref 0.0–0.3)
Total Bilirubin: 0.9 mg/dL (ref 0.2–1.2)
Total Protein: 7.2 g/dL (ref 6.0–8.3)

## 2024-01-26 LAB — PROTIME-INR
INR: 1 ratio (ref 0.8–1.0)
Prothrombin Time: 10.9 s (ref 9.6–13.1)

## 2024-01-26 NOTE — Progress Notes (Signed)
 Chief Complaint: Elevated liver function test Primary GI Doctor: (previously Dr. Christella Hartigan) Dr. Chales Abrahams  HPI:  Patient is a  73  year old female/female patient with past medical history of anxiety, GERD, hyperlipidemia, and hypothyroidism,who was referred to me by Raliegh Ip, DO on 01/15/24 for a complaint of elevated liver function test.    Interval History     Patient presents today to discuss evaluation for elevated liver enzymes. She was doing her annual lab work with PCP when it was discovered she had elevated liver enzymes.  No recent exposure or illness. No recent travel. She states she has been on the Lipitor for cholesterol for several years. She took Bactrim February 26 th for UTI for 3 days. She reports taking approximately three rounds of antibiotics over the course of the past year for recurrent UTI's She denies any new herbal supplements.  She has never had issues with liver disease or elevated LFT's prior to this. Denies abdominal pain, nausea, or vomiting. No dark urine or clay colored stool. No jaundiced skin. No blood in stool.  No family history of liver disease or CA. Family history of brain tumor in brother and lung CA in father.   No alcohol use. No tylenol use. She uses OTC ibuprofen very seldom.   History of osteopenia, only on Calcium supplements.   Wt Readings from Last 3 Encounters:  01/26/24 138 lb 4 oz (62.7 kg)  01/11/24 137 lb 9.6 oz (62.4 kg)  12/09/23 139 lb (63 kg)    Past Medical History:  Diagnosis Date   Allergy    Anxiety    Arthritis    mild hips - left worse   BCC (basal cell carcinoma of skin) 07/31/2016   Superficial Bcc left shoulder   Cataract    forming   Elevated liver enzymes    GERD (gastroesophageal reflux disease)    History of cold sores    Hyperlipidemia    Osteopenia    Rosacea    SVT (supraventricular tachycardia) (HCC)    past hx - no issues   Thyroid disease    hypothyroid   Past Surgical History:  Procedure  Laterality Date   COLONOSCOPY     07-21-2011   skin cancer removal     tiny per pt   TUBAL LIGATION     WISDOM TOOTH EXTRACTION     Current Outpatient Medications  Medication Sig Dispense Refill   atorvastatin (LIPITOR) 40 MG tablet Take 1 tablet (40 mg total) by mouth daily. 90 tablet 3   Calcium Carbonate-Vitamin D (CALCIUM 500/D PO) Take by mouth.     Cholecalciferol (VITAMIN D3) 2000 UNITS TABS Take 1 tablet by mouth daily. 5000 IU daily     clobetasol ointment (TEMOVATE) 0.05 % Apply to affected areas on external vagina nightly as needed for lichen sclerosis 45 g 1   escitalopram (LEXAPRO) 10 MG tablet Take 1 tablet (10 mg total) by mouth daily. 90 tablet 3   levothyroxine (SYNTHROID) 75 MCG tablet Take 1 tablet (75 mcg total) by mouth daily. 90 tablet 3   No current facility-administered medications for this visit.   Allergies as of 01/26/2024 - Review Complete 01/26/2024  Allergen Reaction Noted   Bactrim [sulfamethoxazole-trimethoprim] Nausea Only 01/12/2024   Family History  Problem Relation Age of Onset   Colon polyps Mother    Glaucoma Mother    Cerebral aneurysm Mother    Macular degeneration Mother    Stroke Father    Hypertension  Father    Lung cancer Father    Brain cancer Brother    Heart attack Maternal Uncle    Heart attack Paternal Uncle    Colon cancer Neg Hx    Stomach cancer Neg Hx    Esophageal cancer Neg Hx    Rectal cancer Neg Hx    Review of Systems:    Constitutional: No weight loss, fever, chills, weakness or fatigue HEENT: Eyes: No change in vision               Ears, Nose, Throat:  No change in hearing or congestion Skin: No rash or itching Cardiovascular: No chest pain, chest pressure or palpitations   Respiratory: No SOB or cough Gastrointestinal: See HPI and otherwise negative Genitourinary: No dysuria or change in urinary frequency Neurological: No headache, dizziness or syncope Musculoskeletal: No new muscle or joint  pain Hematologic: No bleeding or bruising Psychiatric: No history of depression or anxiety    Physical Exam:  Vital signs: BP 112/78   Pulse 76   Ht 5\' 5"  (1.651 m)   Wt 138 lb 4 oz (62.7 kg)   BMI 23.01 kg/m   Constitutional:   Pleasant  female appears to be in NAD, Well developed, Well nourished, alert and cooperative. Eyes:   PEERL, EOMI. No icterus. Conjunctiva pink Throat: Oral cavity and pharynx without inflammation, swelling or lesion.  Respiratory: Respirations even and unlabored. Lungs clear to auscultation bilaterally.   No wheezes, crackles, or rhonchi.  Cardiovascular: Normal S1, S2. Regular rate and rhythm. No peripheral edema, cyanosis or pallor.  Gastrointestinal:  Soft, nondistended, nontender. No rebound or guarding. Normal bowel sounds. No appreciable masses or hepatomegaly. Rectal:  Not performed.  Msk:  Symmetrical without gross deformities. Without edema, no deformity or joint abnormality.  Neurologic:  Alert and  oriented x4;  grossly normal neurologically.  Skin:   Dry and intact without significant lesions or rashes. Psychiatric: Oriented to person, place and time. Demonstrates good judgement and reason without abnormal affect or behaviors.  RELEVANT LABS AND IMAGING: CBC    Latest Ref Rng & Units 11/21/2022    2:17 PM 05/22/2021    9:37 AM 11/18/2019    9:57 AM  CBC  WBC 3.4 - 10.8 x10E3/uL 5.8  5.9  5.9   Hemoglobin 11.1 - 15.9 g/dL 40.9  81.1  91.4   Hematocrit 34.0 - 46.6 % 38.9  39.5  39.8   Platelets 150 - 450 x10E3/uL 226  295  287      CMP     Latest Ref Rng & Units 01/19/2024    9:25 AM 01/14/2024    9:58 AM 01/12/2024    9:47 AM  CMP  Glucose 70 - 99 mg/dL   90   BUN 8 - 27 mg/dL   14   Creatinine 7.82 - 1.00 mg/dL   9.56   Sodium 213 - 086 mmol/L   140   Potassium 3.5 - 5.2 mmol/L   4.3   Chloride 96 - 106 mmol/L   103   CO2 20 - 29 mmol/L   24   Calcium 8.7 - 10.3 mg/dL   9.3   Total Protein 6.0 - 8.5 g/dL 6.4  6.3  6.3   Total  Bilirubin 0.0 - 1.2 mg/dL 0.6  0.5  0.5   Alkaline Phos 44 - 121 IU/L 187  170  173   AST 0 - 40 IU/L 131  161  154   ALT 0 - 32  IU/L 233  245  235      Lab Results  Component Value Date   TSH 0.405 (L) 01/12/2024  01/14/24 labs show: Hep A IgM negative, hepatitis B surface antigen negative, hep B C IgM negative, hepatitis B surface antibody quant less than 3.5, hep C virus antibody nonreactive Alk phos 173> 170> 187 AST 154> 161> 131 ALT 235> 245> 233 01/15/2024 ultrasound abdomen limited right upper quadrant IMPRESSION: No gallstones or ductal dilatation. 07/17/2021 colonoscopy with Dr. Howard Macho, recall 3 years Impression: - Three 3 to 4 mm polyps in the transverse colon and in the ascending colon, removed with a cold snare. Resected and retrieved. - Internal hemorrhoids. - The examination was otherwise normal on direct and retroflexion views. Path:Surgical [P], colon, transverse and ascending, polyp (3) - TUBULAR ADENOMA (4 OF 4 FRAGMENTS) - NO HIGH-GRADE DYSPLASIA OR MALIGNANCY IDENTIFIED 08/23/2018 echo-The estimated ejection fraction was in the range of 65% to 70%.  07/21/2011 colonoscopy with Dr. Arvie Latus Endoscopic impression 3mm sessile polyp in the sigmoid colon Otherwise normal exam  Assessment:     73 year old female patient that presents for elevated liver enzymes discovered during yearly lab work.  Her levels were checked 2 more times over the course of the past week.  Prior to that back in March 2024 patient's LFT's were normal.  Total bilirubin normal. Hepatitis panel negative, no immunity to hepatitis B. Recommended Hep B series today.  Abdominal ultrasound right upper quadrant reveals no gallstones or ductal dilatation.  Liver is within normal limits. Possible Medications induced?  Statins can raise LFTs she has been on for several years Beck Cofer consider trialing off if numbers do not improve. Lexapro is not common to see elevated LFTs. She did receive Bactrim 3 day course, I would  suspect this would have resolved by now almost 8 weeks later , but can cause liver injury although not common. Pt does not drink alcohol. No GI complaints of abdominal pain, nausea or vomiting. Will check serology to r/o autoimmune or genetic. Will trend LFT's over next several months.    Pt has history of colonic polyps and due for colon screening in October of this year, recall placed.  Plan: - Check Hepatic serologic workup: ANA, AMA, Anti-smooth muscle antibody,  ferritin, TIBC,  tTG, total IgA, PT/INR,CBC, IgG, hepatic panel function -Recommend hepatitis B series today, declined. -Recall colonoscopy 07/2024  Thank you for the courtesy of this consult. Please call me with any questions or concerns.   Aspin Palomarez, FNP-C [facility] 01/26/2024, 9:24 AM  Cc: Eliodoro Guerin, DO

## 2024-01-26 NOTE — Patient Instructions (Addendum)
 Avoid Alcohol Avoid excessive Tylenol use Avoid adding new herbal supplements  Your provider has requested that you go to the basement level for lab work before leaving today. Press "B" on the elevator. The lab is located at the first door on the left as you exit the elevator.  _______________________________________________________  If your blood pressure at your visit was 140/90 or greater, please contact your primary care physician to follow up on this.  _______________________________________________________  If you are age 37 or older, your body mass index should be between 23-30. Your Body mass index is 23.01 kg/m. If this is out of the aforementioned range listed, please consider follow up with your Primary Care Provider.  If you are age 71 or younger, your body mass index should be between 19-25. Your Body mass index is 23.01 kg/m. If this is out of the aformentioned range listed, please consider follow up with your Primary Care Provider.   ________________________________________________________  The [facility] providers would like to encourage you to use MYCHART to communicate with providers for non-urgent requests or questions.  Due to long hold times on the telephone, sending your provider a message by [facility] may be a faster and more efficient way to get a response.  Please allow 48 business hours for a response.  Please remember that this is for non-urgent requests.  _______________________________________________________ Thank you for trusting me with your gastrointestinal care!   Dyanna Glasgow, NP

## 2024-01-28 LAB — ANA: Anti Nuclear Antibody (ANA): POSITIVE — AB

## 2024-01-28 LAB — TISSUE TRANSGLUTAMINASE ABS,IGG,IGA
(tTG) Ab, IgA: <1 U/mL
(tTG) Ab, IgG: <1 U/mL

## 2024-01-28 LAB — IGG: IgG (Immunoglobin G), Serum: 1219 mg/dL (ref 600–1540)

## 2024-01-28 LAB — ANTI-NUCLEAR AB-TITER (ANA TITER): ANA Titer 1: 1:1280 {titer} — ABNORMAL HIGH

## 2024-01-28 LAB — IGA: Immunoglobulin A: 193 mg/dL (ref 70–320)

## 2024-01-28 LAB — ANTI-SMOOTH MUSCLE ANTIBODY, IGG: Actin (Smooth Muscle) Antibody (IGG): <20 U (ref <20)

## 2024-01-28 LAB — MITOCHONDRIAL ANTIBODIES: Mitochondrial M2 Ab, IgG: <=20 U (ref <=20.0)

## 2024-02-03 ENCOUNTER — Telehealth: Payer: Self-pay | Admitting: Gastroenterology

## 2024-02-03 NOTE — Telephone Encounter (Signed)
 Pt calling to discuss results from recent labs done on 01/26/2024. Pt stated that she seen the results on my chart and is concerned.  Please review and advise.

## 2024-02-03 NOTE — Telephone Encounter (Signed)
 Inbound call from patient requesting a call back to discuss lab results from 4/15. Please advise.

## 2024-02-04 NOTE — Telephone Encounter (Signed)
 My Chart message sent to patient with results.

## 2024-02-07 NOTE — Progress Notes (Signed)
 ANA is significantly elevated and homogeneous Agreed that she likely has DILI Lets also stop statins for now Repeat LFTs in 2 weeks and then in 4 weeks. Needs rheumatology consult Please follow her up in 4 weeks If LFTs are persistently high, would need liver biopsy

## 2024-02-08 ENCOUNTER — Other Ambulatory Visit: Payer: Self-pay | Admitting: *Deleted

## 2024-02-08 DIAGNOSIS — R748 Abnormal levels of other serum enzymes: Secondary | ICD-10-CM

## 2024-02-08 DIAGNOSIS — R768 Other specified abnormal immunological findings in serum: Secondary | ICD-10-CM

## 2024-02-22 ENCOUNTER — Other Ambulatory Visit (INDEPENDENT_AMBULATORY_CARE_PROVIDER_SITE_OTHER)

## 2024-02-22 DIAGNOSIS — R748 Abnormal levels of other serum enzymes: Secondary | ICD-10-CM

## 2024-02-22 LAB — HEPATIC FUNCTION PANEL
ALT: 98 U/L — ABNORMAL HIGH (ref 0–35)
AST: 53 U/L — ABNORMAL HIGH (ref 0–37)
Albumin: 4 g/dL (ref 3.5–5.2)
Alkaline Phosphatase: 155 U/L — ABNORMAL HIGH (ref 39–117)
Bilirubin, Direct: 0.2 mg/dL (ref 0.0–0.3)
Total Bilirubin: 0.7 mg/dL (ref 0.2–1.2)
Total Protein: 6.9 g/dL (ref 6.0–8.3)

## 2024-02-23 ENCOUNTER — Ambulatory Visit: Payer: Self-pay | Admitting: Gastroenterology

## 2024-02-25 ENCOUNTER — Other Ambulatory Visit

## 2024-02-25 DIAGNOSIS — R351 Nocturia: Secondary | ICD-10-CM | POA: Diagnosis not present

## 2024-02-25 DIAGNOSIS — E039 Hypothyroidism, unspecified: Secondary | ICD-10-CM | POA: Diagnosis not present

## 2024-02-25 DIAGNOSIS — N390 Urinary tract infection, site not specified: Secondary | ICD-10-CM | POA: Diagnosis not present

## 2024-02-25 DIAGNOSIS — R350 Frequency of micturition: Secondary | ICD-10-CM | POA: Diagnosis not present

## 2024-02-25 DIAGNOSIS — N3946 Mixed incontinence: Secondary | ICD-10-CM | POA: Diagnosis not present

## 2024-02-26 LAB — T4, FREE: Free T4: 1.85 ng/dL — ABNORMAL HIGH (ref 0.82–1.77)

## 2024-02-26 LAB — THYROID PANEL WITH TSH
Free Thyroxine Index: 4.1 (ref 1.2–4.9)
T3 Uptake Ratio: 33 % (ref 24–39)
T4, Total: 12.4 ug/dL — ABNORMAL HIGH (ref 4.5–12.0)
TSH: 0.308 u[IU]/mL — ABNORMAL LOW (ref 0.450–4.500)

## 2024-02-29 ENCOUNTER — Telehealth: Payer: Self-pay | Admitting: Family Medicine

## 2024-02-29 ENCOUNTER — Other Ambulatory Visit: Payer: Self-pay | Admitting: Family Medicine

## 2024-02-29 ENCOUNTER — Ambulatory Visit: Payer: Self-pay | Admitting: Family Medicine

## 2024-02-29 DIAGNOSIS — E039 Hypothyroidism, unspecified: Secondary | ICD-10-CM

## 2024-02-29 MED ORDER — LEVOTHYROXINE SODIUM 50 MCG PO TABS: 50.0000 ug | ORAL_TABLET | Freq: Every day | ORAL | 0 refills | Status: DC

## 2024-02-29 NOTE — Telephone Encounter (Signed)
 Please call patient. She has questions about her medications.    Copied from CRM [id number]. Topic: General - Call Back - No Documentation >> Feb 29, 2024  1:02 PM Stanly Early wrote: Reason for CRM: patient would like a callback from her provider about medication [phone]

## 2024-02-29 NOTE — Telephone Encounter (Signed)
 I spoke to pt and she was a little confused about her thyroid  medication and TSH levels. Reviewed results and correct dosage of her levothyroxine  per Dr Crissie Dome and pt voiced understanding.

## 2024-03-02 NOTE — Progress Notes (Signed)
 Office Visit Note  Patient: [name] [name]             Date of Birth: 20-Jul-1951           MRN: [id number]             PCP: Eliodoro Guerin, DO Referring: May, Deanna J, NP Visit Date: 03/16/2024 Occupation: @GUAROCC @  Subjective:  Elevated LFTs  History of Present Illness: [name] [name] is a 73 y.o. female seen for the evaluation of positive ANA and elevated LFTs.  According the patient in April 2025 she went for her physical and was found to have elevated LFTs.  She states her PCP did the ultrasound of her liver on January 15, 2024 which was unremarkable.  She was referred to gastroenterology where she had labs which showed elevated LFTs.  She was taken off the Lipitor and had repeat LFTs which were somewhat better.  Her ANA also came positive.  She was referred to me for further evaluation of positive ANA.  She denies any history of oral ulcers, nasal ulcers, malar rash, photosensitivity, Raynaud's, lymphadenopathy or inflammatory arthritis.  She takes ibuprofen occasionally.  She had been on Lipitor for the last 30 years which she came off about 7 weeks ago.  She is not taking any over-the-counter supplements.  There is no family history of autoimmune disease except the thyroid  disease.  She is married, retired, gravida 1, para 1.  There is no history of preeclampsia or DVTs.  She is retired.  She used to work in [employer] previously.  She enjoys coloring apps, reading and crafts.  She does not drink any alcohol and is non-smoker.    Activities of Daily Living:  Patient reports morning stiffness for 0 minutes.   Patient Denies nocturnal pain.  Difficulty dressing/grooming: Denies Difficulty climbing stairs: Denies Difficulty getting out of chair: Denies Difficulty using hands for taps, buttons, cutlery, and/or writing: Denies  Review of Systems  Constitutional:  Negative for fatigue.  HENT:  Negative for mouth sores and mouth dryness.   Eyes:  Negative for dryness.   Respiratory:  Negative for shortness of breath.   Cardiovascular:  Negative for chest pain and palpitations.  Gastrointestinal:  Negative for blood in stool, constipation and diarrhea.  Endocrine: Negative for increased urination.  Genitourinary:  Negative for painful urination and involuntary urination.  Musculoskeletal:  Negative for joint pain, gait problem, joint pain, joint swelling, myalgias, muscle weakness, morning stiffness, muscle tenderness and myalgias.  Skin:  Negative for color change, rash, hair loss and sensitivity to sunlight.  Allergic/Immunologic: Negative for susceptible to infections.  Neurological:  Negative for dizziness and headaches.  Hematological:  Negative for swollen glands.  Psychiatric/Behavioral:  Negative for depressed mood and sleep disturbance. The patient is nervous/anxious.     PMFS History:  Patient Active Problem List   Diagnosis Date Noted   Proximal phalanx fracture of finger 11/28/2021   Subacute frontal sinusitis 01/14/2021   Postmenopausal 11/22/2019   Vitamin D  deficiency 11/22/2019   Depression, recurrent (HCC) 11/22/2019   SVT (supraventricular tachycardia) (HCC) 05/03/2019   Upper respiratory tract infection    Syncope 08/22/2018   Precordial chest pain 08/22/2018   Chest pain in adult    Nausea and vomiting in adult    Sore throat 06/16/2018   Healthcare maintenance 10/27/2016   Insomnia 10/02/2015   Low serum vitamin D  08/16/2014   Hypothyroidism (acquired) 02/03/2013   Hyperlipidemia 02/03/2013   Osteopenia of multiple sites 02/03/2013  Rosacea 02/03/2013   HSV-1 (herpes simplex virus 1) infection 02/03/2013    Past Medical History:  Diagnosis Date   Allergy    Anxiety    Arthritis    mild hips - left worse   BCC (basal cell carcinoma of skin) 07/31/2016   Superficial Bcc left shoulder   Cataract    forming   Elevated liver enzymes    GERD (gastroesophageal reflux disease)    History of cold sores     Hyperlipidemia    Osteopenia    Rosacea    SVT (supraventricular tachycardia) (HCC)    past hx - no issues   Thyroid  disease    hypothyroid    Family History  Problem Relation Age of Onset   Colon polyps Mother    Glaucoma Mother    Cerebral aneurysm Mother    Macular degeneration Mother    Thyroid  disease Mother    Stroke Father    Hypertension Father    Lung cancer Father    Hypertension Brother    Brain cancer Brother    Heart attack Maternal Uncle    Heart attack Paternal Uncle    Migraines Daughter    Colon cancer Neg Hx    Stomach cancer Neg Hx    Esophageal cancer Neg Hx    Rectal cancer Neg Hx    Past Surgical History:  Procedure Laterality Date   COLONOSCOPY     07-21-2011   skin cancer removal     tiny per pt   TUBAL LIGATION     WISDOM TOOTH EXTRACTION     Social History   Social History Narrative   Patient is married.  Her husband recently retired last March and they have been trying to travel more.   She has a daughter who lives locally.  She has grandchildren.  She is active in her church and tries to stay physically active.   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 History  Administered Date(s) Administered   Fluad Quad(high Dose 65+) 08/09/2019   Fluad Trivalent(High Dose 65+) 07/08/2023   PFIZER(Purple Top)SARS-COV-2 Vaccination 06/27/2020, 07/18/2020   Pneumococcal Conjugate-13 05/02/2019   Pneumococcal Polysaccharide-23 05/31/2020   Td 05/26/2022   Tdap 04/21/2011   Zoster Recombinant(Shingrix ) 10/19/2018, 04/11/2019   Zoster, Live 05/14/2011     Objective: Vital Signs: BP 120/78 (BP Location: Right Arm, Patient Position: Sitting, Cuff Size: Normal)   Pulse 65   Resp 14   Ht 5\' 5"  (1.651 m)   Wt 139 lb 6.4 oz (63.2 kg)   BMI 23.20 kg/m    Physical Exam Vitals and nursing note reviewed.   Constitutional:      Appearance: She is well-developed.  HENT:     Head: Normocephalic and atraumatic.  Eyes:     Conjunctiva/sclera: Conjunctivae normal.  Cardiovascular:     Rate and Rhythm: Normal rate and regular rhythm.     Heart sounds: Normal heart sounds.  Pulmonary:     Effort: Pulmonary effort is normal.     Breath sounds: Normal breath sounds.  Abdominal:     General: Bowel sounds are normal.     Palpations: Abdomen is soft.  Musculoskeletal:     Cervical back: Normal range of motion.  Lymphadenopathy:     Cervical: No cervical adenopathy.  Skin:  General: Skin is warm and dry.     Capillary Refill: Capillary refill takes less than 2 seconds.     Comments: She had good capillary refill without any sclerodactyly or nailbed capillary changes.  Neurological:     Mental Status: She is alert and oriented to person, place, and time.  Psychiatric:        Behavior: Behavior normal.      Musculoskeletal Exam: Cervical, thoracic and lumbar spine with good range of motion.  Shoulders, elbow joints, wrist joints, MCPs PIPs and DIPs with good range of motion with no synovitis.  Hip joints, knee joints, ankles with good range of motion.  Bilateral bunions were noted.  No synovitis was noted.  CDAI Exam: CDAI Score: -- Patient Global: --; Provider Global: -- Swollen: --; Tender: -- Joint Exam 03/16/2024   No joint exam has been documented for this visit   There is currently no information documented on the homunculus. Go to the Rheumatology activity and complete the homunculus joint exam.  Investigation: No additional findings.  Imaging: No results found.  Recent Labs: Lab Results  Component Value Date   WBC 7.1 01/26/2024   HGB 13.8 01/26/2024   PLT 305.0 01/26/2024   NA 140 01/12/2024   K 4.3 01/12/2024   CL 103 01/12/2024   CO2 24 01/12/2024   GLUCOSE 90 01/12/2024   BUN 14 01/12/2024   CREATININE 0.98 01/12/2024   BILITOT 0.7 02/22/2024   ALKPHOS 155  (H) 02/22/2024   AST 53 (H) 02/22/2024   ALT 98 (H) 02/22/2024   PROT 6.9 02/22/2024   ALBUMIN 4.0 02/22/2024   CALCIUM  9.3 01/12/2024   GFRAA 68 11/15/2020   January 12, 2024 vitamin D51.7, lipid panel normal January 14, 2024 hepatitis A-, hepatitis B negative, hepatitis C negative normal January 26, 2024 alkaline phosphatase 171, AST 109, ALT 186, iron studies normal, anti-tTG negative, antimitochondrial negative, anti-smooth muscle antibody negative, IgA normal, IgG normal, ANA 1: 1280 NH 02/22/24 alkaline phosphatase 155, AST 53, ALT 98 Feb 25, 2024 TSH 0.308, T4 12.4, T3 normal, free T4 normal   Speciality Comments: No specialty comments available.  Procedures:  No procedures performed Allergies: Bactrim  [sulfamethoxazole -trimethoprim ]   Assessment / Plan:     Visit Diagnoses: Positive ANA (antinuclear antibody) -patient was found to have positive ANA further workup of elevated LFTs.  She denies any history of oral ulcers, nasal ulcers, malar rash, photosensitivity, Raynaud's, lymphadenopathy or inflammatory arthritis.  No synovitis was noted on the examination.  No nailbed capillary changes were noted.  I will obtain complete ENA panel today.  Plan: Sedimentation rate, Protein / creatinine ratio, urine, ANA, Anti-scleroderma antibody, RNP Antibody, Anti-Smith antibody, Sjogrens syndrome-A extractable nuclear antibody, Sjogrens syndrome-B extractable nuclear antibody, Anti-DNA antibody, double-stranded, C3 and C4, Beta-2 glycoprotein antibodies, Cardiolipin antibodies, IgG, IgM, IgA  Elevated LFTs - Plan: Comprehensive metabolic panel with GFR  Bilateral bunions-bilateral bunions were noted.  She states that bunions are not painful.  Proper fitting shoes were advised.  Osteopenia of multiple sites -she is on calcium  and Vitamin D .  Benefits of resistive exercises were discussed.  Vitamin D  deficiency -she is on Vit D 5000 U daily.  Vitamin D  was 51.7 in April 2025.  Rosacea  Mixed  hyperlipidemia - Off Lipitor due to elevated LFTs  Hypothyroidism (acquired) - Plan: Thyroid  peroxidase antibody, Thyroglobulin antibody  Lichen sclerosus of vulva  Anxiety -she is on on Lexapro   Early dry stage nonexudative age-related macular degeneration of both eyes  Orders:  Orders Placed This Encounter  Procedures   Comprehensive metabolic panel with GFR   Sedimentation rate   Protein / creatinine ratio, urine   ANA   Anti-scleroderma antibody   RNP Antibody   Anti-Smith antibody   Sjogrens syndrome-A extractable nuclear antibody   Sjogrens syndrome-B extractable nuclear antibody   Anti-DNA antibody, double-stranded   C3 and C4   Beta-2 glycoprotein antibodies   Cardiolipin antibodies, IgG, IgM, IgA   Thyroid  peroxidase antibody   Thyroglobulin antibody   No orders of the defined types were placed in this encounter.    Follow-Up Instructions: Return for +ANA.   Nicholas Bari, MD  Note - This record has been created using Animal nutritionist.  Chart creation errors have been sought, but may not always  have been located. Such creation errors do not reflect on  the standard of medical care.

## 2024-03-16 ENCOUNTER — Ambulatory Visit: Attending: Rheumatology | Admitting: Rheumatology

## 2024-03-16 ENCOUNTER — Encounter: Payer: Self-pay | Admitting: Rheumatology

## 2024-03-16 VITALS — BP 120/78 | HR 65 | Resp 14 | Ht 65.0 in | Wt 139.4 lb

## 2024-03-16 DIAGNOSIS — R768 Other specified abnormal immunological findings in serum: Secondary | ICD-10-CM | POA: Diagnosis not present

## 2024-03-16 DIAGNOSIS — F5101 Primary insomnia: Secondary | ICD-10-CM

## 2024-03-16 DIAGNOSIS — E782 Mixed hyperlipidemia: Secondary | ICD-10-CM

## 2024-03-16 DIAGNOSIS — B009 Herpesviral infection, unspecified: Secondary | ICD-10-CM

## 2024-03-16 DIAGNOSIS — H353131 Nonexudative age-related macular degeneration, bilateral, early dry stage: Secondary | ICD-10-CM

## 2024-03-16 DIAGNOSIS — M21611 Bunion of right foot: Secondary | ICD-10-CM | POA: Diagnosis not present

## 2024-03-16 DIAGNOSIS — E559 Vitamin D deficiency, unspecified: Secondary | ICD-10-CM

## 2024-03-16 DIAGNOSIS — I471 Supraventricular tachycardia, unspecified: Secondary | ICD-10-CM

## 2024-03-16 DIAGNOSIS — R7989 Other specified abnormal findings of blood chemistry: Secondary | ICD-10-CM

## 2024-03-16 DIAGNOSIS — M8589 Other specified disorders of bone density and structure, multiple sites: Secondary | ICD-10-CM | POA: Diagnosis not present

## 2024-03-16 DIAGNOSIS — F339 Major depressive disorder, recurrent, unspecified: Secondary | ICD-10-CM

## 2024-03-16 DIAGNOSIS — F419 Anxiety disorder, unspecified: Secondary | ICD-10-CM | POA: Diagnosis not present

## 2024-03-16 DIAGNOSIS — E039 Hypothyroidism, unspecified: Secondary | ICD-10-CM | POA: Diagnosis not present

## 2024-03-16 DIAGNOSIS — N904 Leukoplakia of vulva: Secondary | ICD-10-CM | POA: Diagnosis not present

## 2024-03-16 DIAGNOSIS — M21612 Bunion of left foot: Secondary | ICD-10-CM

## 2024-03-16 DIAGNOSIS — L719 Rosacea, unspecified: Secondary | ICD-10-CM | POA: Diagnosis not present

## 2024-03-16 DIAGNOSIS — Z780 Asymptomatic menopausal state: Secondary | ICD-10-CM

## 2024-03-17 ENCOUNTER — Ambulatory Visit: Payer: Self-pay | Admitting: Rheumatology

## 2024-03-17 NOTE — Progress Notes (Signed)
 CMP shows elevated LFTs.  Please forward results to her PCP and her gastroenterologist.  Rest of the labs are pending.

## 2024-03-19 LAB — PROTEIN / CREATININE RATIO, URINE
Creatinine, Urine: 91 mg/dL (ref 20–275)
Protein/Creat Ratio: 55 mg/g{creat} (ref 24–184)
Protein/Creatinine Ratio: 0.055 mg/mg{creat} (ref 0.024–0.184)
Total Protein, Urine: 5 mg/dL (ref 5–24)

## 2024-03-19 LAB — C3 AND C4
C3 Complement: 175 mg/dL (ref 83–193)
C4 Complement: 25 mg/dL (ref 15–57)

## 2024-03-19 LAB — COMPREHENSIVE METABOLIC PANEL WITH GFR
AG Ratio: 1.5 (calc) (ref 1.0–2.5)
ALT: 138 U/L — ABNORMAL HIGH (ref 6–29)
AST: 41 U/L — ABNORMAL HIGH (ref 10–35)
Albumin: 4 g/dL (ref 3.6–5.1)
Alkaline phosphatase (APISO): 205 U/L — ABNORMAL HIGH (ref 37–153)
BUN: 18 mg/dL (ref 7–25)
CO2: 30 mmol/L (ref 20–32)
Calcium: 9.5 mg/dL (ref 8.6–10.4)
Chloride: 103 mmol/L (ref 98–110)
Creat: 1 mg/dL (ref 0.60–1.00)
Globulin: 2.7 g/dL (ref 1.9–3.7)
Glucose, Bld: 89 mg/dL (ref 65–99)
Potassium: 4.6 mmol/L (ref 3.5–5.3)
Sodium: 141 mmol/L (ref 135–146)
Total Bilirubin: 0.7 mg/dL (ref 0.2–1.2)
Total Protein: 6.7 g/dL (ref 6.1–8.1)
eGFR: 61 mL/min/{1.73_m2} (ref >=60)

## 2024-03-19 LAB — BETA-2 GLYCOPROTEIN ANTIBODIES
Beta-2 Glyco 1 IgA: <2 U/mL (ref <20.0)
Beta-2 Glyco 1 IgM: 19.8 U/mL (ref <20.0)
Beta-2 Glyco I IgG: 2 U/mL (ref <20.0)

## 2024-03-19 LAB — ANA: Anti Nuclear Antibody (ANA): POSITIVE — AB

## 2024-03-19 LAB — CARDIOLIPIN ANTIBODIES, IGG, IGM, IGA
Anticardiolipin IgA: 2.3 [APL'U]/mL (ref <20.0)
Anticardiolipin IgG: 3.5 [GPL'U]/mL (ref <20.0)
Anticardiolipin IgM: 19.9 [MPL'U]/mL (ref <20.0)

## 2024-03-19 LAB — SEDIMENTATION RATE: Sed Rate: 2 mm/h (ref 0–30)

## 2024-03-19 LAB — ANTI-NUCLEAR AB-TITER (ANA TITER): ANA Titer 1: 1:1280 {titer} — ABNORMAL HIGH

## 2024-03-19 LAB — RNP ANTIBODY: Ribonucleic Protein(ENA) Antibody, IgG: <1 AI

## 2024-03-19 LAB — SJOGRENS SYNDROME-B EXTRACTABLE NUCLEAR ANTIBODY: SSB (La) (ENA) Antibody, IgG: <1 AI

## 2024-03-19 LAB — ANTI-DNA ANTIBODY, DOUBLE-STRANDED: ds DNA Ab: 3 [IU]/mL

## 2024-03-19 LAB — ANTI-SCLERODERMA ANTIBODY: Scleroderma (Scl-70) (ENA) Antibody, IgG: <1 AI

## 2024-03-19 LAB — ANTI-SMITH ANTIBODY: ENA SM Ab Ser-aCnc: <1 AI

## 2024-03-19 LAB — THYROID PEROXIDASE ANTIBODY: Thyroperoxidase Ab SerPl-aCnc: 1 [IU]/mL (ref <9)

## 2024-03-19 LAB — THYROGLOBULIN ANTIBODY: Thyroglobulin Ab: 1 [IU]/mL (ref <=1)

## 2024-03-19 LAB — SJOGRENS SYNDROME-A EXTRACTABLE NUCLEAR ANTIBODY: SSA (Ro) (ENA) Antibody, IgG: <1 AI

## 2024-03-19 NOTE — Progress Notes (Signed)
 ANA is pending.  ENA panel negative, complements normal, thyroid  antibodies negative, anticardiolipin antibodies negative.  I will discuss results at the follow-up visit.

## 2024-03-19 NOTE — Progress Notes (Signed)
 ANA titer remains elevated.  All other labs negative.

## 2024-03-22 ENCOUNTER — Other Ambulatory Visit: Payer: Self-pay

## 2024-03-22 ENCOUNTER — Telehealth: Payer: Self-pay | Admitting: Gastroenterology

## 2024-03-22 DIAGNOSIS — R748 Abnormal levels of other serum enzymes: Secondary | ICD-10-CM

## 2024-03-22 DIAGNOSIS — R768 Other specified abnormal immunological findings in serum: Secondary | ICD-10-CM

## 2024-03-22 NOTE — Telephone Encounter (Signed)
 Spoke with patient about Dr. Hobert Lull recommendations for liver biopsy for elevated LFTs.  Patient is in agreement. Landon Pinion RN will set up.

## 2024-03-23 ENCOUNTER — Telehealth: Payer: Self-pay | Admitting: Gastroenterology

## 2024-03-23 NOTE — Telephone Encounter (Signed)
 PT is returning call she received on yesterday regarding liver biopsy appointment. Please advise.

## 2024-03-23 NOTE — Telephone Encounter (Signed)
Spoke with pt.  Documented in result notes. Pt verbalized understanding with all questions answered.

## 2024-03-29 ENCOUNTER — Ambulatory Visit: Admitting: Gastroenterology

## 2024-04-05 ENCOUNTER — Other Ambulatory Visit (INDEPENDENT_AMBULATORY_CARE_PROVIDER_SITE_OTHER)

## 2024-04-05 DIAGNOSIS — R768 Other specified abnormal immunological findings in serum: Secondary | ICD-10-CM

## 2024-04-05 DIAGNOSIS — R748 Abnormal levels of other serum enzymes: Secondary | ICD-10-CM

## 2024-04-05 LAB — PROTIME-INR
INR: 1.1 ratio — ABNORMAL HIGH (ref 0.8–1.0)
Prothrombin Time: 11.5 s (ref 9.6–13.1)

## 2024-04-11 ENCOUNTER — Other Ambulatory Visit

## 2024-04-11 ENCOUNTER — Other Ambulatory Visit: Payer: Self-pay

## 2024-04-11 DIAGNOSIS — E039 Hypothyroidism, unspecified: Secondary | ICD-10-CM | POA: Diagnosis not present

## 2024-04-11 DIAGNOSIS — Z01818 Encounter for other preprocedural examination: Secondary | ICD-10-CM

## 2024-04-12 ENCOUNTER — Ambulatory Visit: Payer: Self-pay | Admitting: Family Medicine

## 2024-04-12 ENCOUNTER — Other Ambulatory Visit: Payer: Self-pay

## 2024-04-12 ENCOUNTER — Ambulatory Visit (HOSPITAL_COMMUNITY)
Admission: RE | Admit: 2024-04-12 | Discharge: 2024-04-12 | Disposition: A | Discharge status: Home / Self Care | Source: Ambulatory Visit | Attending: Gastroenterology | Admitting: Gastroenterology

## 2024-04-12 DIAGNOSIS — R748 Abnormal levels of other serum enzymes: Secondary | ICD-10-CM | POA: Insufficient documentation

## 2024-04-12 DIAGNOSIS — R896 Abnormal cytological findings in specimens from other organs, systems and tissues: Secondary | ICD-10-CM | POA: Diagnosis not present

## 2024-04-12 DIAGNOSIS — K759 Inflammatory liver disease, unspecified: Secondary | ICD-10-CM | POA: Diagnosis not present

## 2024-04-12 DIAGNOSIS — R7989 Other specified abnormal findings of blood chemistry: Secondary | ICD-10-CM | POA: Diagnosis not present

## 2024-04-12 DIAGNOSIS — Z01818 Encounter for other preprocedural examination: Secondary | ICD-10-CM

## 2024-04-12 LAB — CBC
HCT: 41 % (ref 36.0–46.0)
Hemoglobin: 13.3 g/dL (ref 12.0–15.0)
MCH: 29.6 pg (ref 26.0–34.0)
MCHC: 32.4 g/dL (ref 30.0–36.0)
MCV: 91.3 fL (ref 80.0–100.0)
Platelets: 292 10*3/uL (ref 150–400)
RBC: 4.49 MIL/uL (ref 3.87–5.11)
RDW: 13.1 % (ref 11.5–15.5)
WBC: 6.9 10*3/uL (ref 4.0–10.5)
nRBC: 0 % (ref 0.0–0.2)

## 2024-04-12 LAB — PROTIME-INR
INR: 0.9 (ref 0.8–1.2)
Prothrombin Time: 13 s (ref 11.4–15.2)

## 2024-04-12 LAB — TSH+FREE T4
Free T4: 1.11 ng/dL (ref 0.82–1.77)
TSH: 6.89 u[IU]/mL — ABNORMAL HIGH (ref 0.450–4.500)

## 2024-04-12 MED ORDER — MIDAZOLAM HCL 2 MG/2ML IJ SOLN
INTRAMUSCULAR | Status: AC | PRN
  Administered 2024-04-12: 1 mg via INTRAVENOUS

## 2024-04-12 MED ORDER — SODIUM CHLORIDE 0.9 % IV SOLN: INTRAVENOUS | Status: DC

## 2024-04-12 MED ORDER — MIDAZOLAM HCL 2 MG/2ML IJ SOLN
INTRAMUSCULAR | Status: AC
  Filled 2024-04-12: qty 2

## 2024-04-12 MED ORDER — FENTANYL CITRATE (PF) 100 MCG/2ML IJ SOLN
INTRAMUSCULAR | Status: AC | PRN
  Administered 2024-04-12: 50 ug via INTRAVENOUS

## 2024-04-12 MED ORDER — LIDOCAINE HCL (PF) 1 % IJ SOLN
10.0000 mL | Freq: Once | INTRAMUSCULAR | Status: AC
  Administered 2024-04-12: 10 mL via INTRADERMAL

## 2024-04-12 MED ORDER — FENTANYL CITRATE (PF) 100 MCG/2ML IJ SOLN
INTRAMUSCULAR | Status: AC
  Filled 2024-04-12: qty 2

## 2024-04-12 NOTE — Procedures (Signed)
 Interventional Radiology Procedure Note  Procedure: U/S  Guided Biopsy of liver  Complications: None  Estimated Blood Loss: < 10 mL  Findings: 18 G core biopsy of right lobe liver performed under U/S guidance.  1 core samples obtained and sent to Pathology.  Cordella DELENA Banner, MD

## 2024-04-12 NOTE — H&P (Signed)
 Chief Complaint: Patient was seen in consultation today for liver biopsy  Referring Physician(s): May,Deanna J  Supervising Physician: Jenna Hacker  Patient Status: [facility] - Out-pt  History of Present Illness: [name] [name] is a 73 y.o. female being worked up for elevated liver enzymes. She is referred for image guided random liver biopsy.  PMHx, meds, labs, imaging, allergies reviewed. Feels well, no recent fevers, chills, illness. Has been NPO today as directed. Family at bedside.   Past Medical History:  Diagnosis Date   Allergy    Anxiety    Arthritis    mild hips - left worse   BCC (basal cell carcinoma of skin) 07/31/2016   Superficial Bcc left shoulder   Cataract    forming   Elevated liver enzymes    GERD (gastroesophageal reflux disease)    History of cold sores    Hyperlipidemia    Osteopenia    Rosacea    SVT (supraventricular tachycardia) (HCC)    past hx - no issues   Thyroid  disease    hypothyroid    Past Surgical History:  Procedure Laterality Date   COLONOSCOPY     07-21-2011   skin cancer removal     tiny per pt   TUBAL LIGATION     WISDOM TOOTH EXTRACTION      Allergies: Bactrim  [sulfamethoxazole -trimethoprim ]  Medications: Prior to Admission medications   Medication Sig Start Date End Date Taking? Authorizing Provider  Calcium  Carbonate-Vitamin D  (CALCIUM  500/D PO) Take by mouth.   Yes [provider]  escitalopram  (LEXAPRO ) 10 MG tablet Take 1 tablet (10 mg total) by mouth daily. 01/11/24  Yes Gottschalk, Norene M, DO  levothyroxine  (SYNTHROID ) 50 MCG tablet Take 1 tablet (50 mcg total) by mouth daily. Dose change 02/29/24  Yes Gottschalk, Norene M, DO  VITAMIN D  PO Take 5,000 Units by mouth daily.   Yes [provider]  atorvastatin  (LIPITOR) 40 MG tablet Take 1 tablet (40 mg total) by mouth daily. Patient not taking: Reported on 03/16/2024 01/11/24   Jolinda Norene HERO, DO  clobetasol  ointment (TEMOVATE ) 0.05 %  Apply to affected areas on external vagina nightly as needed for lichen sclerosis 01/11/24   Jolinda Norene HERO, DO     Family History  Problem Relation Age of Onset   Colon polyps Mother    Glaucoma Mother    Cerebral aneurysm Mother    Macular degeneration Mother    Thyroid  disease Mother    Stroke Father    Hypertension Father    Lung cancer Father    Hypertension Brother    Brain cancer Brother    Heart attack Maternal Uncle    Heart attack Paternal Uncle    Migraines Daughter    Colon cancer Neg Hx    Stomach cancer Neg Hx    Esophageal cancer Neg Hx    Rectal cancer Neg Hx     Social History   Socioeconomic History   Marital status: Married    Spouse name: Todd    Number of children: 1   Years of education: 12   Highest education level: High school graduate  Occupational History   Occupation: Orthoptist: Tour manager [employer]  Tobacco Use   Smoking status: Never    Passive exposure: Never   Smokeless tobacco: Never  Vaping Use   Vaping status: Never Used  Substance and Sexual Activity   Alcohol use: No   Drug use: No   Sexual activity: Yes  Partners: Male  Other Topics Concern   Not on file  Social History Narrative   Patient is married.  Her husband recently retired last March and they have been trying to travel more.   She has a daughter who lives locally.  She has grandchildren.  She is active in her church and tries to stay physically active.   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 years and is enjoying retirement quite a bit   Social Drivers of Corporate investment banker Strain: Low Risk  (05/27/2023)   Overall Financial Resource Strain (CARDIA)    Difficulty of Paying Living Expenses: Not hard at all  Food Insecurity: No Food Insecurity (05/27/2023)   Hunger Vital Sign    Worried About Running Out of Food in the Last Year: Never true     Ran Out of Food in the Last Year: Never true  Transportation Needs: No Transportation Needs (05/27/2023)   PRAPARE - Administrator, Civil Service (Medical): No    Lack of Transportation (Non-Medical): No  Physical Activity: Inactive (05/27/2023)   Exercise Vital Sign    Days of Exercise per Week: 0 days    Minutes of Exercise per Session: 0 min  Stress: No Stress Concern Present (05/27/2023)   [employer] of Occupational Health - Occupational Stress Questionnaire    Feeling of Stress : Not at all  Social Connections: Socially Integrated (05/27/2023)   Social Connection and Isolation Panel    Frequency of Communication with Friends and Family: More than three times a week    Frequency of Social Gatherings with Friends and Family: More than three times a week    Attends Religious Services: More than 4 times per year    Active Member of [employer] or Organizations: Yes    Attends Engineer, structural: More than 4 times per year    Marital Status: Married    Review of Systems: A 12 point ROS discussed and pertinent positives are indicated in the HPI above.  All other systems are negative.  Review of Systems  Vital Signs: BP 121/89   Pulse 61   Temp 98.1 F (36.7 C) (Oral)   Resp 16   Ht 5' 5 (1.651 m)   Wt 137 lb (62.1 kg)   SpO2 98%   BMI 22.80 kg/m   Physical Exam Constitutional:      Appearance: She is not ill-appearing.  HENT:     Mouth/Throat:     Mouth: Mucous membranes are moist.     Pharynx: Oropharynx is clear.   Cardiovascular:     Rate and Rhythm: Normal rate and regular rhythm.     Heart sounds: Normal heart sounds.  Pulmonary:     Effort: Pulmonary effort is normal. No respiratory distress.     Breath sounds: Normal breath sounds.  Abdominal:     General: There is no distension.     Palpations: Abdomen is soft.     Tenderness: There is no abdominal tenderness.   Skin:    General: Skin is warm and dry.     Coloration: Skin is not  jaundiced.   Neurological:     General: No focal deficit present.     Mental Status: She is alert and oriented to person, place, and time.   Psychiatric:        Mood and Affect:  Mood normal.        Thought Content: Thought content normal.     Imaging: No results found.  Labs:  CBC: Recent Labs    01/26/24 1011 04/12/24 0650  WBC 7.1 6.9  HGB 13.8 13.3  HCT 41.3 41.0  PLT 305.0 292    COAGS: Recent Labs    01/26/24 0947 04/05/24 1313 04/12/24 0650  INR 1.0 1.1* 0.9    BMP: Recent Labs    01/12/24 0947 03/16/24 0826  NA 140 141  K 4.3 4.6  CL 103 103  CO2 24 30  GLUCOSE 90 89  BUN 14 18  CALCIUM  9.3 9.5  CREATININE 0.98 1.00    LIVER FUNCTION TESTS: Recent Labs    01/14/24 0958 01/19/24 0925 01/26/24 0947 02/22/24 0940 03/16/24 0826  BILITOT 0.5 0.6 0.9 0.7 0.7  AST 161* 131* 109* 53* 41*  ALT 245* 233* 186* 98* 138*  ALKPHOS 170* 187* 171* 155*  --   PROT 6.3 6.4 7.2 6.9 6.7  ALBUMIN 4.1 4.0 4.4 4.0  --      Assessment and Plan: Elevated liver enzymes For liver biopsy. Labs reviewed. Risks and benefits of liver biopsy was discussed with the patient and/or patient's family including, but not limited to bleeding, infection, damage to adjacent structures or low yield requiring additional tests.  All of the questions were answered and there is agreement to proceed.  Consent signed and in chart.    Electronically Signed: Franky Rusk, PA-C 04/12/2024, 7:55 AM   I spent a total of 20 minutes in face to face in clinical consultation, greater than 50% of which was counseling/coordinating care for liver biopsy

## 2024-04-12 NOTE — Progress Notes (Signed)
 Office Visit Note  Patient: [name] [name]             Date of Birth: [birth year]           MRN: [id number]             PCP: Jolinda Norene HERO, DO Referring: Jolinda Norene HERO, DO Visit Date: 04/21/2024 Occupation: @GUAROCC @  Subjective:  Positive ANA and elevated LFTs   History of Present Illness: [name] [name] is a 73 y.o. female returns today after her initial visit on March 16, 2024.  She was evaluated for positive ANA and elevated LFTs.  She states that she had a liver biopsy but has not heard about the results.  She denies any history of oral ulcers, nasal ulcers, malar rash, photosensitivity, Raynaud's, lymphadenopathy or inflammatory arthritis.  She states she has been experiencing some itching which she is concerned could be related to elevated LFTs.    Activities of Daily Living:  Patient reports morning stiffness for 0 minute.   Patient Denies nocturnal pain.  Difficulty dressing/grooming: Denies Difficulty climbing stairs: Denies Difficulty getting out of chair: Denies Difficulty using hands for taps, buttons, cutlery, and/or writing: Denies  Review of Systems  Constitutional:  Negative for fatigue.  HENT:  Negative for mouth sores and mouth dryness.   Eyes:  Negative for dryness.  Respiratory:  Negative for shortness of breath.   Cardiovascular:  Negative for chest pain and palpitations.  Gastrointestinal:  Negative for blood in stool, constipation and diarrhea.  Endocrine: Negative for increased urination.  Genitourinary:  Positive for involuntary urination.  Musculoskeletal:  Negative for joint pain, gait problem, joint pain, joint swelling, myalgias, muscle weakness, morning stiffness, muscle tenderness and myalgias.  Skin:  Negative for color change, rash, hair loss and sensitivity to sunlight.  Allergic/Immunologic: Negative for susceptible to infections.  Neurological:  Negative for dizziness and headaches.  Hematological:  Negative for swollen glands.   Psychiatric/Behavioral:  Negative for depressed mood and sleep disturbance. The patient is not nervous/anxious.     PMFS History:  Patient Active Problem List   Diagnosis Date Noted   Proximal phalanx fracture of finger 11/28/2021   Subacute frontal sinusitis 01/14/2021   Postmenopausal 11/22/2019   Vitamin D  deficiency 11/22/2019   Depression, recurrent (HCC) 11/22/2019   SVT (supraventricular tachycardia) (HCC) 05/03/2019   Upper respiratory tract infection    Syncope 08/22/2018   Precordial chest pain 08/22/2018   Chest pain in adult    Nausea and vomiting in adult    Sore throat 06/16/2018   Healthcare maintenance 10/27/2016   Insomnia 10/02/2015   Low serum vitamin D  08/16/2014   Hypothyroidism (acquired) 02/03/2013   Hyperlipidemia 02/03/2013   Osteopenia of multiple sites 02/03/2013   Rosacea 02/03/2013   HSV-1 (herpes simplex virus 1) infection 02/03/2013    Past Medical History:  Diagnosis Date   Allergy    Anxiety    Arthritis    mild hips - left worse   BCC (basal cell carcinoma of skin) 07/31/2016   Superficial Bcc left shoulder   Cataract    forming   Elevated liver enzymes    GERD (gastroesophageal reflux disease)    History of cold sores    Hyperlipidemia    Osteopenia    Rosacea    SVT (supraventricular tachycardia) (HCC)    past hx - no issues   Thyroid  disease    hypothyroid    Family History  Problem Relation Age of Onset   Colon polyps Mother  Glaucoma Mother    Cerebral aneurysm Mother    Macular degeneration Mother    Thyroid  disease Mother    Stroke Father    Hypertension Father    Lung cancer Father    Hypertension Brother    Brain cancer Brother    Heart attack Maternal Uncle    Heart attack Paternal Uncle    Migraines Daughter    Colon cancer Neg Hx    Stomach cancer Neg Hx    Esophageal cancer Neg Hx    Rectal cancer Neg Hx    Past Surgical History:  Procedure Laterality Date   COLONOSCOPY     07-21-2011   skin  cancer removal     tiny per pt   TUBAL LIGATION     WISDOM TOOTH EXTRACTION     Social History   Social History Narrative   Patient is married.  Her husband recently retired last March and they have been trying to travel more.   She has a daughter who lives locally.  She has grandchildren.  She is active in her church and tries to stay physically active.   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 History  Administered Date(s) Administered   Fluad Quad(high Dose 65+) 08/09/2019   Fluad Trivalent(High Dose 65+) 07/08/2023   PFIZER(Purple Top)SARS-COV-2 Vaccination 06/27/2020, 07/18/2020   Pneumococcal Conjugate-13 05/02/2019   Pneumococcal Polysaccharide-23 05/31/2020   Td 05/26/2022   Tdap 04/21/2011   Zoster Recombinant(Shingrix ) 10/19/2018, 04/11/2019   Zoster, Live 05/14/2011     Objective: Vital Signs: BP 111/72 (BP Location: Left Arm, Patient Position: Sitting, Cuff Size: Normal)   Pulse 71   Resp 14   Ht 5' 5 (1.651 m)   Wt 136 lb (61.7 kg)   BMI 22.63 kg/m    Physical Exam Vitals and nursing note reviewed.  Constitutional:      Appearance: She is well-developed.  HENT:     Head: Normocephalic and atraumatic.  Eyes:     Conjunctiva/sclera: Conjunctivae normal.  Cardiovascular:     Rate and Rhythm: Normal rate and regular rhythm.     Heart sounds: Normal heart sounds.  Pulmonary:     Effort: Pulmonary effort is normal.     Breath sounds: Normal breath sounds.  Abdominal:     General: Bowel sounds are normal.     Palpations: Abdomen is soft.  Musculoskeletal:     Cervical back: Normal range of motion.  Lymphadenopathy:     Cervical: No cervical adenopathy.  Skin:    General: Skin is warm and dry.     Capillary Refill: Capillary refill takes less than 2 seconds.  Neurological:     Mental Status: She is alert  and oriented to person, place, and time.  Psychiatric:        Behavior: Behavior normal.      Musculoskeletal Exam: Cervical, thoracic and lumbar spine good range of motion.  Shoulders, elbows, wrists, MCPs PIPs and DIPs Juengel range of motion with no synovitis.  Hips, knees, ankles, MTPs and PIPs with good range of motion.  Bilateral bunions noted.  No synovitis was noted.  CDAI Exam: CDAI Score: -- Patient Global: --; Provider Global: -- Swollen: --; Tender: -- Joint Exam 04/21/2024   No joint exam has been documented for this visit   There is  currently no information documented on the homunculus. Go to the Rheumatology activity and complete the homunculus joint exam.  Investigation: No additional findings.  Imaging: US  BIOPSY (LIVER) Result Date: 04/12/2024 INDICATION: Elevated liver function test EXAM: Ultrasound-guided liver biopsy MEDICATIONS: None. ANESTHESIA/SEDATION: Moderate (conscious) sedation was employed during this procedure. A total of Versed  1 mg and Fentanyl  50 mcg was administered intravenously by the radiology nurse. Total intra-service moderate Sedation Time: 10 minutes. The patient's level of consciousness and vital signs were monitored continuously by radiology nursing throughout the procedure under my direct supervision. COMPLICATIONS: None immediate. PROCEDURE: Informed written consent was obtained from the patient after a thorough discussion of the procedural risks, benefits and alternatives. All questions were addressed. Maximal Sterile Barrier Technique was utilized including caps, mask, sterile gowns, sterile gloves, sterile drape, hand hygiene and skin antiseptic. A timeout was performed prior to the initiation of the procedure. With patient in a left lateral decubitus position, the right upper quadrant was prepped and draped usual sterile fashion. Local anesthesia was achieved with 1% lidocaine . A small incision was made in an intercostal fashion and the BioPince  needle was then advanced from the skin to the liver parenchyma under ultrasound guidance. Once within the liver parenchyma, a 2 cm biopsy was then performed with an 18 gauge needle providing as satisfactory core sample of the liver parenchyma. The needle was retrieved, sterile dressing applied. Hemostasis achieved. IMPRESSION: Successful single needle core biopsy of bland liver parenchyma in the right lobe. Electronically Signed   By: Cordella Banner   On: 04/12/2024 15:48    Recent Labs: Lab Results  Component Value Date   WBC 6.9 04/12/2024   HGB 13.3 04/12/2024   PLT 292 04/12/2024   NA 141 03/16/2024   K 4.6 03/16/2024   CL 103 03/16/2024   CO2 30 03/16/2024   GLUCOSE 89 03/16/2024   BUN 18 03/16/2024   CREATININE 1.00 03/16/2024   BILITOT 0.7 03/16/2024   ALKPHOS 155 (H) 02/22/2024   AST 41 (H) 03/16/2024   ALT 138 (H) 03/16/2024   PROT 6.7 03/16/2024   ALBUMIN 4.0 02/22/2024   CALCIUM  9.5 03/16/2024   GFRAA 68 11/15/2020     Speciality Comments: No specialty comments available.  Procedures:  No procedures performed Allergies: Bactrim  [sulfamethoxazole -trimethoprim ]   Assessment / Plan:     Visit Diagnoses: Positive ANA (antinuclear antibody) - March 16, 2024 urine protein creatinine ratio normal, ANA 1: 1280 NH, ENA (SCL 70, RNP, Smith, SSA, SSB, dsDNA) negative, anticardiolipin negative, beta-2  GP 1 negative, C3-C4 normal, sed rate 2.  TPO negative, thyroglobulin antibody negative.  Lab results were discussed with the patient at length.  She denies  history of oral ulcers, nasal ulcers, malar rash photosensitivity, Raynaud's, lymphadenopathy or inflammatory arthritis.  No synovitis was noted on the examination.  Association of positive ANA with autoimmune hepatitis was also discussed.  Liver biopsy results are not available.  I advised patient to contact if she develops any new symptoms.  I will recheck labs in 1 year.  Elevated LFTs - LFTs remain elevated.  Liver biopsy  April 12, 2024.  Bilateral bunions-proper fitting shoes were advised.  Osteopenia of multiple sites-she takes calcium  with vitamin D .  Vitamin D  deficiency-she is on vitamin D  5000 units daily.  Vitamin D  has been normal.  Rosacea  Mixed hyperlipidemia-she was taking Lipitor but discontinued due to elevated LFTs.  Lichen sclerosus of vulva  Hypothyroidism (acquired)-anti-TPO and thyroglobulin antibodies were negative.  Anxiety  Early dry  stage nonexudative age-related macular degeneration of both eyes  Orders: Orders Placed This Encounter  Procedures   Protein / creatinine ratio, urine   CBC with Differential/Platelet   Comprehensive metabolic panel with GFR   ANA   Anti-DNA antibody, double-stranded   C3 and C4   Sedimentation rate   No orders of the defined types were placed in this encounter.    Follow-Up Instructions: Return in about 1 year (around 04/21/2025), or if symptoms worsen or fail to improve, for +ANA.   Maya Nash, MD  Note - This record has been created using Animal nutritionist.  Chart creation errors have been sought, but may not always  have been located. Such creation errors do not reflect on  the standard of medical care.

## 2024-04-13 DIAGNOSIS — N3020 Other chronic cystitis without hematuria: Secondary | ICD-10-CM | POA: Diagnosis not present

## 2024-04-13 DIAGNOSIS — N390 Urinary tract infection, site not specified: Secondary | ICD-10-CM | POA: Diagnosis not present

## 2024-04-21 ENCOUNTER — Encounter: Payer: Self-pay | Admitting: Rheumatology

## 2024-04-21 ENCOUNTER — Ambulatory Visit: Attending: Rheumatology | Admitting: Rheumatology

## 2024-04-21 VITALS — BP 111/72 | HR 71 | Resp 14 | Ht 65.0 in | Wt 136.0 lb

## 2024-04-21 DIAGNOSIS — N904 Leukoplakia of vulva: Secondary | ICD-10-CM | POA: Diagnosis not present

## 2024-04-21 DIAGNOSIS — L719 Rosacea, unspecified: Secondary | ICD-10-CM | POA: Diagnosis not present

## 2024-04-21 DIAGNOSIS — H353131 Nonexudative age-related macular degeneration, bilateral, early dry stage: Secondary | ICD-10-CM | POA: Diagnosis not present

## 2024-04-21 DIAGNOSIS — E782 Mixed hyperlipidemia: Secondary | ICD-10-CM

## 2024-04-21 DIAGNOSIS — E559 Vitamin D deficiency, unspecified: Secondary | ICD-10-CM | POA: Diagnosis not present

## 2024-04-21 DIAGNOSIS — F419 Anxiety disorder, unspecified: Secondary | ICD-10-CM

## 2024-04-21 DIAGNOSIS — R7989 Other specified abnormal findings of blood chemistry: Secondary | ICD-10-CM

## 2024-04-21 DIAGNOSIS — M8589 Other specified disorders of bone density and structure, multiple sites: Secondary | ICD-10-CM | POA: Diagnosis not present

## 2024-04-21 DIAGNOSIS — E039 Hypothyroidism, unspecified: Secondary | ICD-10-CM

## 2024-04-21 DIAGNOSIS — M21612 Bunion of left foot: Secondary | ICD-10-CM

## 2024-04-21 DIAGNOSIS — M21611 Bunion of right foot: Secondary | ICD-10-CM | POA: Diagnosis not present

## 2024-04-21 DIAGNOSIS — R768 Other specified abnormal immunological findings in serum: Secondary | ICD-10-CM

## 2024-04-21 LAB — SURGICAL PATHOLOGY

## 2024-04-22 ENCOUNTER — Ambulatory Visit: Payer: Self-pay | Admitting: Gastroenterology

## 2024-05-01 NOTE — Progress Notes (Signed)
 Please inform the patient. Patient [place] west (2-[place] bus tour) and will be back July 26.  Negative AMA, ASMA Liver biopsy: Mild portal inflammation without any bridging fibrosis.  No evidence of autoimmune hepatitis. Likely drug-induced I have discussed with pathology and other GI colleagues  Still patient has history of itching, with increased alk phos.   Plan: - Continue to hold statins - Repeat LFTs, check GGT, PT/INR, alkaline phosphatase isoenzymes - Get all the blood works ordered by Dr. Dolphus (already in orders) - Start Urso 500 mg p.o. BID (may have some beneficial role) - Follow-up with [name] [name] in 4-6 weeks. (MUST) - If still with problems or increasing LFTs, would need to get hepatology consultation with Dr. [name] ([facility]).  She does have clinic in [place]  Plan: Send report to family physician

## 2024-05-09 ENCOUNTER — Telehealth: Payer: Self-pay

## 2024-05-09 ENCOUNTER — Ambulatory Visit: Payer: Self-pay

## 2024-05-09 ENCOUNTER — Other Ambulatory Visit: Payer: Self-pay

## 2024-05-09 DIAGNOSIS — R748 Abnormal levels of other serum enzymes: Secondary | ICD-10-CM

## 2024-05-09 DIAGNOSIS — R768 Other specified abnormal immunological findings in serum: Secondary | ICD-10-CM

## 2024-05-09 MED ORDER — URSODIOL 250 MG PO TABS: 500.0000 mg | ORAL_TABLET | Freq: Two times a day (BID) | ORAL | 0 refills | Status: DC

## 2024-05-09 NOTE — Telephone Encounter (Signed)
 FYI Only or Action Required?: FYI only for provider.  Patient was last seen in primary care on 01/11/2024 by Jolinda Norene HERO, DO.  Called Nurse Triage reporting No chief complaint on file..  Symptoms began a week ago.  Interventions attempted: OTC medications: Mucinex  .  Symptoms are: unchanged.  Triage Disposition: Call PCP Within 24 Hours  Patient/caregiver understands and will follow disposition?: Yes   Copied from CRM #[id number]. Topic: Clinical - Red Word Triage >> May 09, 2024  9:44 AM Emylou G wrote: [facility] that prompted transfer to Nurse Triage: head congestion, raw throat, fatigue, chills, can't hear very well.. very stopped up.. cough off and on ( flem ) Reason for Disposition  [1] Patient is NOT HIGH RISK AND [2] strongly requests antiviral medicine AND [3] COVID-19 symptoms present < 5 days  Answer Assessment - Initial Assessment Questions 1. SYMPTOMS: What is your main symptom or concern? (e.g., cough, fever, shortness of breath, muscle aches)     Cough  2. ONSET: When did the symptoms start?       Saturday  3. COUGH: Do you have a cough? If Yes, ask: How bad is the cough?       Yes, frequent  4. FEVER: Do you have a fever? If Yes, ask: What is your temperature, how was it measured, and when did it start?     No  5. BREATHING DIFFICULTY: Are you having any difficulty breathing? (e.g., normal; shortness of breath, wheezing, unable to speak)      None  6. BETTER-SAME-WORSE: Are you getting better, staying the same or getting worse compared to yesterday?  If getting worse, ask, In what way?     Same  7. OTHER SYMPTOMS: Do you have any other symptoms?  (e.g., chills, fatigue, headache, loss of smell or taste, muscle pain, sore throat)     Sore Throat, Headache, Chills, Congestion (Head and Ears)  8. COVID-19 DIAGNOSIS: How do you know that you have COVID? (e.g., positive lab test or self-test, diagnosed by doctor or NP/PA, symptoms after  exposure).     Home Test after exposure  9. COVID-19 EXPOSURE: Was there any known exposure to COVID before the symptoms began?      Known exposure on a bus tour  10. COVID-19 VACCINE: Have you had the COVID-19 vaccine? If Yes, ask: When did you last get it?       *No Answer*  11. HIGH RISK DISEASE: Do you have any chronic medical problems? (e.g., asthma, heart or lung disease, weak immune system, obesity, etc.)       Denies  12. PREGNANCY: Is there any chance you are pregnant? When was your last menstrual period?       No and No  13. O2 SATURATION MONITOR:  Do you use an oxygen  saturation monitor (pulse oximeter) at home? If Yes, ask What is your reading (oxygen  level) today? What is your usual oxygen  saturation reading? (e.g., 95%)       Unsure  Patient denies taking a home test for COVID, but is sure due to exposure.  Protocols used: COVID-19 - Diagnosed or Suspected-A-AH

## 2024-05-09 NOTE — Telephone Encounter (Signed)
 July 2026, 1 week prior to her next appointment

## 2024-05-09 NOTE — Telephone Encounter (Signed)
 Patient contacted the office stating she was sent to see [facility] again and they advised her she has to get the pending lab orders in her chart for Dr. Dolphus done before they will proceed. Patient states at her last visit, she was not advised to get more labs done right now by Dr. Dolphus. Advised the patient there is a note in the office visit that states labs will be rechecked in 1 year. Please clarify when the patient should have the future lab orders done.

## 2024-05-09 NOTE — Telephone Encounter (Signed)
 Appt made

## 2024-05-09 NOTE — Telephone Encounter (Signed)
**Note De-identified  [place] Obfuscation** Please advise 

## 2024-05-10 ENCOUNTER — Telehealth (INDEPENDENT_AMBULATORY_CARE_PROVIDER_SITE_OTHER): Admitting: Family Medicine

## 2024-05-10 ENCOUNTER — Encounter: Payer: Self-pay | Admitting: Family Medicine

## 2024-05-10 DIAGNOSIS — U071 COVID-19: Secondary | ICD-10-CM | POA: Diagnosis not present

## 2024-05-10 DIAGNOSIS — J019 Acute sinusitis, unspecified: Secondary | ICD-10-CM | POA: Diagnosis not present

## 2024-05-10 DIAGNOSIS — B9689 Other specified bacterial agents as the cause of diseases classified elsewhere: Secondary | ICD-10-CM

## 2024-05-10 MED ORDER — PREDNISONE 20 MG PO TABS
40.0000 mg | ORAL_TABLET | Freq: Every day | ORAL | 0 refills | Status: AC
Start: 2024-05-10 — End: 2024-05-13

## 2024-05-10 MED ORDER — CEFDINIR 300 MG PO CAPS: 300.0000 mg | ORAL_CAPSULE | Freq: Two times a day (BID) | ORAL | 0 refills | Status: AC

## 2024-05-10 NOTE — Progress Notes (Signed)
 MyChart Video visit  Subjective: RR:RNCPI PCP: Jolinda Norene HERO, DO YEP:[name] [name] is a 73 y.o. female. Patient provides verbal consent for consult held via video.  Due to COVID-19 pandemic this visit was conducted virtually. This visit type was conducted due to national recommendations for restrictions regarding the COVID-19 Pandemic (e.g. social distancing, sheltering in place) in an effort to limit this patient's exposure and mitigate transmission in our community. All issues noted in this document were discussed and addressed.  A physical exam was not performed with this format.   Location of patient: home Location of provider: [facility] Others present for call: none  1. COVID  She reports she had COVID last week.  She had multiple exposures while on a bus trip.  Today is day #9.  She reports her head is packed full of congestion and she has a productive cough with color change.  She reports sensation of weakness.  She reports fatigue.  She had some SOB last week that resolved.   ROS: Per HPI  Allergies  Allergen Reactions   Bactrim  [Sulfamethoxazole -Trimethoprim ] Nausea Only    Nausea and fatigue   Past Medical History:  Diagnosis Date   Allergy    Anxiety    Arthritis    mild hips - left worse   BCC (basal cell carcinoma of skin) 07/31/2016   Superficial Bcc left shoulder   Cataract    forming   Elevated liver enzymes    GERD (gastroesophageal reflux disease)    History of cold sores    Hyperlipidemia    Osteopenia    Rosacea    SVT (supraventricular tachycardia) (HCC)    past hx - no issues   Thyroid  disease    hypothyroid    Current Outpatient Medications:    atorvastatin  (LIPITOR) 40 MG tablet, Take 1 tablet (40 mg total) by mouth daily. (Patient not taking: Reported on 04/21/2024), Disp: 90 tablet, Rfl: 3   Calcium  Carbonate-Vitamin D  (CALCIUM  500/D PO), Take by mouth., Disp: , Rfl:    clobetasol  ointment (TEMOVATE ) 0.05 %, Apply to affected areas on  external vagina nightly as needed for lichen sclerosis, Disp: 45 g, Rfl: 1   escitalopram  (LEXAPRO ) 10 MG tablet, Take 1 tablet (10 mg total) by mouth daily., Disp: 90 tablet, Rfl: 3   levothyroxine  (SYNTHROID ) 50 MCG tablet, Take 1 tablet (50 mcg total) by mouth daily. Dose change, Disp: 90 tablet, Rfl: 0   Multiple Vitamins-Minerals (PRESERVISION AREDS 2 PO), Take by mouth., Disp: , Rfl:    ursodiol  (URSO ) 250 MG tablet, Take 2 tablets (500 mg total) by mouth 2 (two) times daily., Disp: 120 tablet, Rfl: 0   VITAMIN D  PO, Take 5,000 Units by mouth daily., Disp: , Rfl:   Gen: Nontoxic female no acute distress HEENT: No facial swelling.  No conjunctival injection. Pulm: Normal work of breathing on room air.  No observed wheezing.  No dyspnea speech  Assessment/ Plan: 73 y.o. female   Acute bacterial sinusitis - Plan: cefdinir  (OMNICEF ) 300 MG capsule, predniSONE  (DELTASONE ) 20 MG tablet  COVID-19 - Plan: cefdinir  (OMNICEF ) 300 MG capsule, predniSONE  (DELTASONE ) 20 MG tablet Known COVID infection.  It sounds like she is developing a secondary bacterial infection and I have prescribed her antibiotics.  We discussed red flag signs and symptoms warranting further evaluation in office and she voiced good understanding.  Follow-up as needed  Start time: 3:05pm End time: 3:10pm  Total time spent on patient care (including video visit/ documentation): 6 minutes  Norene CHRISTELLA Fielding, DO [facility] [phone]

## 2024-05-10 NOTE — Telephone Encounter (Signed)
 Patient advised July 2026, 1 week prior to her next appointment. Patient verbalized understanding.

## 2024-05-17 ENCOUNTER — Ambulatory Visit (INDEPENDENT_AMBULATORY_CARE_PROVIDER_SITE_OTHER): Admitting: Nurse Practitioner

## 2024-05-17 ENCOUNTER — Encounter: Payer: Self-pay | Admitting: Nurse Practitioner

## 2024-05-17 VITALS — BP 110/73 | HR 72 | Temp 97.2°F | Ht 65.0 in | Wt 139.2 lb

## 2024-05-17 DIAGNOSIS — J019 Acute sinusitis, unspecified: Secondary | ICD-10-CM

## 2024-05-17 DIAGNOSIS — B9689 Other specified bacterial agents as the cause of diseases classified elsewhere: Secondary | ICD-10-CM | POA: Diagnosis not present

## 2024-05-17 MED ORDER — AZELASTINE HCL 0.1 % NA SOLN: 1.0000 | Freq: Two times a day (BID) | NASAL | 12 refills | Status: AC

## 2024-05-17 NOTE — Progress Notes (Signed)
 Acute Office Visit  Subjective:     Patient ID: [name] [name], female    DOB: 16-Jan-1951, 73 y.o.   MRN: [id number]  Chief Complaint  Patient presents with   Covid Positive    Covid positive 7/21 feeling better now.    HPI [name] [name] is a 73 year old female presenting on May 17, 2024, for an acute visit due to persistent nasal congestion. She was evaluated virtually by her primary care provider on May 10, 2024, and diagnosed with acute bacterial sinusitis following a recent COVID exposure. She was prescribed Cefdinir  300 mg twice daily for 7 days and a short course of prednisone .  She reports completing the prednisone  but still has some remaining antibiotic doses. Despite treatment, she continues to experience nasal congestion. She denies fever, chills, chest pain, or other systemic symptoms.  Active Ambulatory Problems    Diagnosis Date Noted   Hypothyroidism (acquired) 02/03/2013   Hyperlipidemia 02/03/2013   Osteopenia of multiple sites 02/03/2013   Rosacea 02/03/2013   HSV-1 (herpes simplex virus 1) infection 02/03/2013   Low serum vitamin D  08/16/2014   Insomnia 10/02/2015   Healthcare maintenance 10/27/2016   Sore throat 06/16/2018   Syncope 08/22/2018   Precordial chest pain 08/22/2018   Chest pain in adult    Nausea and vomiting in adult    Upper respiratory tract infection    SVT (supraventricular tachycardia) (HCC) 05/03/2019   Postmenopausal 11/22/2019   Vitamin D  deficiency 11/22/2019   Depression, recurrent (HCC) 11/22/2019   Subacute frontal sinusitis 01/14/2021   Proximal phalanx fracture of finger 11/28/2021   Resolved Ambulatory Problems    Diagnosis Date Noted   Influenza with other respiratory manifestations 11/25/2013   Fever, unspecified 11/25/2013   Heart palpitations 12/27/2013   Past Medical History:  Diagnosis Date   Allergy    Anxiety    Arthritis    BCC (basal cell carcinoma of skin) 07/31/2016   Cataract    Elevated liver  enzymes    GERD (gastroesophageal reflux disease)    History of cold sores    Osteopenia    Thyroid  disease     Review of Systems  Constitutional:  Negative for chills and fever.  HENT:  Positive for congestion. Negative for sore throat.   Respiratory:  Negative for shortness of breath and wheezing.   Cardiovascular:  Negative for chest pain and leg swelling.  Gastrointestinal:  Negative for abdominal pain, constipation, nausea and vomiting.  Skin:  Negative for itching and rash.  Neurological:  Negative for dizziness and headaches.   Negative unless indicated in HPI    Objective:    BP 110/73   Pulse 72   Temp (!) 97.2 F (36.2 C) (Temporal)   Ht 5' 5 (1.651 m)   Wt 139 lb 3.2 oz (63.1 kg)   SpO2 96%   BMI 23.16 kg/m  BP Readings from Last 3 Encounters:  05/17/24 110/73  04/21/24 111/72  04/12/24 103/66   Wt Readings from Last 3 Encounters:  05/17/24 139 lb 3.2 oz (63.1 kg)  04/21/24 136 lb (61.7 kg)  04/12/24 137 lb (62.1 kg)      Physical Exam Vitals and nursing note reviewed.  Constitutional:      General: She is not in acute distress. HENT:     Head: Normocephalic and atraumatic.     Nose: Congestion present.  Eyes:     General: No scleral icterus.    Conjunctiva/sclera: Conjunctivae normal.     Pupils: Pupils are equal,  round, and reactive to light.  Cardiovascular:     Heart sounds: Normal heart sounds.  Pulmonary:     Effort: Pulmonary effort is normal.     Breath sounds: Normal breath sounds. No wheezing or rhonchi.  Musculoskeletal:        General: Normal range of motion.     Right lower leg: No edema.     Left lower leg: No edema.  Skin:    General: Skin is warm and dry.     Findings: No rash.  Neurological:     Mental Status: She is alert and oriented to person, place, and time.  Psychiatric:        Mood and Affect: Mood normal.        Behavior: Behavior normal.        Thought Content: Thought content normal.        Judgment: Judgment  normal.    Pertinent labs & imaging results that were available during my care of the patient were reviewed by me and considered in my medical decision making.  No results found for any visits on 05/17/24.      Assessment & Plan:  Acute bacterial sinusitis -     Azelastine  HCl; Place 1 spray into both nostrils 2 (two) times daily. Use in each nostril as directed  Dispense: 30 mL; Refill: 12   [name] [name] is a 73 year old Caucasian female seen today for URI symptoms, no acute distress Acute bacterial sinusitis  Patient with recent COVID exposure developed symptoms consistent with bacterial sinusitis. Treated with Cefdinir  300 mg BID x7 days and short course of prednisone . Reports completion of steroids but not the full antibiotic course. Still experiencing nasal congestion. No signs of systemic infection (no fever, chills, chest pain).  Persistent nasal congestion -  Likely residual inflammation or incomplete resolution of sinusitis. Could also be related to allergic rhinitis or post-viral congestion. Complete antibiotic course: Reinforce importance of completing full 7-day course of Cefdinir  300 mg BID, even if symptoms are partially improved.  Add supportive treatments: Astelin  nasal spray twice daily Also can try nasal saline irrigation twice daily such as  NeilMed or Neti pot.  Monitor symptoms:If symptoms persist >10 days or worsen such as facial pain, fever, purulent discharge), may consider re-evaluation and possible second-line antibiotic such as Augmentin .   The above assessment and management plan was discussed with the patient. The patient verbalized understanding of and has agreed to the management plan. Patient is aware to call the clinic if they develop any new symptoms or if symptoms persist or worsen. Patient is aware when to return to the clinic for a follow-up visit. Patient educated on when it is appropriate to go to the emergency department.  Return if symptoms worsen  or fail to improve.  [name] Rezabek [place] Thompson, DNP [facility] [address] [place], [place] [address] [phone]  Note: This document was prepared by [employer] voice dictation technology and any errors that results from this process are unintentional.

## 2024-05-25 ENCOUNTER — Other Ambulatory Visit: Payer: Self-pay | Admitting: Family Medicine

## 2024-05-25 ENCOUNTER — Other Ambulatory Visit (INDEPENDENT_AMBULATORY_CARE_PROVIDER_SITE_OTHER)

## 2024-05-25 DIAGNOSIS — R748 Abnormal levels of other serum enzymes: Secondary | ICD-10-CM

## 2024-05-25 DIAGNOSIS — R768 Other specified abnormal immunological findings in serum: Secondary | ICD-10-CM

## 2024-05-25 DIAGNOSIS — E039 Hypothyroidism, unspecified: Secondary | ICD-10-CM

## 2024-05-25 LAB — PROTIME-INR
INR: 1 ratio (ref 0.8–1.0)
Prothrombin Time: 10.6 s (ref 9.6–13.1)

## 2024-05-25 LAB — HEPATIC FUNCTION PANEL
ALT: 30 U/L (ref 0–35)
AST: 21 U/L (ref 0–37)
Albumin: 3.6 g/dL (ref 3.5–5.2)
Alkaline Phosphatase: 145 U/L — ABNORMAL HIGH (ref 39–117)
Bilirubin, Direct: 0.1 mg/dL (ref 0.0–0.3)
Total Bilirubin: 0.7 mg/dL (ref 0.2–1.2)
Total Protein: 6.7 g/dL (ref 6.0–8.3)

## 2024-05-25 LAB — ALKALINE PHOSPHATASE: Alkaline Phosphatase: 145 U/L — ABNORMAL HIGH (ref 39–117)

## 2024-05-25 LAB — GAMMA GT: GGT: 196 U/L — ABNORMAL HIGH (ref 7–51)

## 2024-05-27 ENCOUNTER — Ambulatory Visit (INDEPENDENT_AMBULATORY_CARE_PROVIDER_SITE_OTHER): Payer: HMO

## 2024-05-27 VITALS — BP 110/73 | HR 72 | Ht 65.0 in | Wt 139.0 lb

## 2024-05-27 DIAGNOSIS — Z0000 Encounter for general adult medical examination without abnormal findings: Secondary | ICD-10-CM

## 2024-05-27 NOTE — Progress Notes (Signed)
 Subjective:   [name] [name] is a 73 y.o. who presents for a Medicare Wellness preventive visit.  As a reminder, Annual Wellness Visits don't include a physical exam, and some assessments may be limited, especially if this visit is performed virtually. We may recommend an in-person follow-up visit with your provider if needed.  Visit Complete: Virtual I connected with  Meeya Goldin on 05/27/24 by a audio enabled telemedicine application and verified that I am speaking with the correct person using two identifiers.  Patient Location: Home  Provider Location: Home Office  I discussed the limitations of evaluation and management by telemedicine. The patient expressed understanding and agreed to proceed.  Vital Signs: Because this visit was a virtual/telehealth visit, some criteria may be missing or patient reported. Any vitals not documented were not able to be obtained and vitals that have been documented are patient reported.  VideoDeclined- This patient declined Librarian, academic. Therefore the visit was completed with audio only.  Persons Participating in Visit: Patient.  AWV Questionnaire: No: Patient Medicare AWV questionnaire was not completed prior to this visit.  Cardiac Risk Factors include: advanced age (>58men, >26 women);dyslipidemia     Objective:    Today's Vitals   05/27/24 1031  BP: 110/73  Pulse: 72  Weight: 139 lb (63 kg)  Height: 5' 5 (1.651 m)   Body mass index is 23.13 kg/m.     05/27/2024   10:37 AM 04/12/2024    6:52 AM 05/27/2023    1:10 PM 05/22/2022    4:02 PM 07/17/2021    9:44 AM 05/21/2021    1:45 PM 05/16/2020    2:33 PM  Advanced Directives  Does Patient Have a Medical Advance Directive? Yes Yes No No No No No  Type of Sales promotion account executive of Attorney       Copy of Healthcare Power of Attorney in Chart? No - copy requested        Would patient like information on creating a  medical advance directive?   Yes (MAU/Ambulatory/Procedural Areas - Information given) No - Patient declined No - Patient declined No - Patient declined No - Patient declined    Current Medications (verified) Outpatient Encounter Medications as of 05/27/2024  Medication Sig   azelastine  (ASTELIN ) 0.1 % nasal spray Place 1 spray into both nostrils 2 (two) times daily. Use in each nostril as directed   Calcium  Carbonate-Vitamin D  (CALCIUM  500/D PO) Take by mouth.   clobetasol  ointment (TEMOVATE ) 0.05 % Apply to affected areas on external vagina nightly as needed for lichen sclerosis   escitalopram  (LEXAPRO ) 10 MG tablet Take 1 tablet (10 mg total) by mouth daily.   levothyroxine  (SYNTHROID ) 50 MCG tablet TAKE 1 TABLET (50 MCG TOTAL) BY MOUTH DAILY. DOSE CHANGE   Multiple Vitamins-Minerals (PRESERVISION AREDS 2 PO) Take by mouth.   ursodiol  (URSO ) 250 MG tablet Take 2 tablets (500 mg total) by mouth 2 (two) times daily.   VITAMIN D  PO Take 5,000 Units by mouth daily.   atorvastatin  (LIPITOR) 40 MG tablet Take 1 tablet (40 mg total) by mouth daily. (Patient not taking: Reported on 05/27/2024)   No facility-administered encounter medications on file as of 05/27/2024.    Allergies (verified) Bactrim  [sulfamethoxazole -trimethoprim ]   History: Past Medical History:  Diagnosis Date   Allergy    Anxiety    Arthritis    mild hips - left worse   BCC (basal cell carcinoma of skin) 07/31/2016  Superficial Bcc left shoulder   Cataract    forming   Elevated liver enzymes    GERD (gastroesophageal reflux disease)    History of cold sores    Hyperlipidemia    Osteopenia    Rosacea    SVT (supraventricular tachycardia) (HCC)    past hx - no issues   Thyroid  disease    hypothyroid   Past Surgical History:  Procedure Laterality Date   COLONOSCOPY     07-21-2011   skin cancer removal     tiny per pt   TUBAL LIGATION     WISDOM TOOTH EXTRACTION     Family History  Problem Relation Age of  Onset   Colon polyps Mother    Glaucoma Mother    Cerebral aneurysm Mother    Macular degeneration Mother    Thyroid  disease Mother    Stroke Father    Hypertension Father    Lung cancer Father    Hypertension Brother    Brain cancer Brother    Heart attack Maternal Uncle    Heart attack Paternal Uncle    Migraines Daughter    Colon cancer Neg Hx    Stomach cancer Neg Hx    Esophageal cancer Neg Hx    Rectal cancer Neg Hx    Social History   Socioeconomic History   Marital status: Married    Spouse name: Todd    Number of children: 1   Years of education: 12   Highest education level: High school graduate  Occupational History   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  Her husband recently retired last March and they have been trying to travel more.   She has a daughter who lives locally.  She has grandchildren.  She is active in her church and tries to stay physically active.   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 years and is enjoying retirement quite a bit   Social Drivers of Corporate investment banker Strain: Low Risk  (05/27/2024)   Overall Financial Resource Strain (CARDIA)    Difficulty of Paying Living Expenses: Not hard at all  Food Insecurity: No Food Insecurity (05/27/2024)   Hunger Vital Sign    Worried About Running Out of Food in the Last Year: Never true    Ran Out of Food in the Last Year: Never true  Transportation Needs: No Transportation Needs (05/27/2024)   PRAPARE - Administrator, Civil Service  (Medical): No    Lack of Transportation (Non-Medical): No  Physical Activity: Inactive (05/27/2024)   Exercise Vital Sign    Days of Exercise per Week: 0 days    Minutes of Exercise per Session: 0 min  Stress: No Stress Concern Present (05/27/2024)   [employer] of Occupational Health - Occupational Stress Questionnaire    Feeling of Stress: Not at all  Social Connections: Socially Integrated (05/27/2024)   Social Connection and Isolation Panel  Frequency of Communication with Friends and Family: More than three times a week    Frequency of Social Gatherings with Friends and Family: More than three times a week    Attends Religious Services: More than 4 times per year    Active Member of [employer] or Organizations: Yes    Attends Engineer, structural: More than 4 times per year    Marital Status: Married    Tobacco Counseling Counseling given: Yes    Clinical Intake:  Pre-visit preparation completed: Yes  Pain : No/denies pain     BMI - recorded: 23.13 Nutritional Status: BMI of 19-24  Normal Nutritional Risks: None Diabetes: No  No results found for: HGBA1C   How often do you need to have someone help you when you read instructions, pamphlets, or other written materials from your doctor or pharmacy?: 1 - Never  Interpreter Needed?: No  Information entered by :: alia t/cma   Activities of Daily Living     05/27/2024   10:36 AM  In your present state of health, do you have any difficulty performing the following activities:  Hearing? 0  Vision? 0  Difficulty concentrating or making decisions? 0  Walking or climbing stairs? 0  Dressing or bathing? 0  Doing errands, shopping? 0  Preparing Food and eating ? N  Using the Toilet? N  In the past six months, have you accidently leaked urine? Y  Do you have problems with loss of bowel control? N  Managing your Medications? N  Managing your Finances? N  Housekeeping or managing your Housekeeping? N     Patient Care Team: Jolinda Norene HERO, DO as PCP - General (Family Medicine) Alvia Norleen BIRCH, MD as Consulting Physician (Ophthalmology) Ladora Ross Lacy Phebe, MD as Referring Physician (Optometry) Teressa Toribio SQUIBB, MD (Inactive) as Attending Physician (Gastroenterology)  I have updated your Care Teams any recent Medical Services you may have received from other providers in the past year.     Assessment:   This is a routine wellness examination for Jazelle.  Hearing/Vision screen Hearing Screening - Comments:: Pt denies hearing dif Vision Screening - Comments:: Pt wear glasses/pt goes [facility] in [place] and Dr. Donnice in [place] ov 2024    Goals Addressed             This Visit's Progress    Welcome to Medicare   On track    05/22/2022 AWV Goal: Exercise for General Health  Patient will verbalize understanding of the benefits of increased physical activity: Exercising regularly is important. It will improve your overall fitness, flexibility, and endurance. Regular exercise also will improve your overall health. It can help you control your weight, reduce stress, and improve your bone density. Over the next year, patient will increase physical activity as tolerated with a goal of at least 150 minutes of moderate physical activity per week.  You can tell that you are exercising at a moderate intensity if your heart starts beating faster and you start breathing faster but can still hold a conversation. Moderate-intensity exercise ideas include: Walking 1 mile (1.6 km) in about 15 minutes Biking Hiking Golfing Dancing Water aerobics Patient will verbalize understanding of everyday activities that increase physical activity by providing examples like the following: Yard work, such as: Insurance underwriter Gardening Washing windows or floors Patient will be able to explain general  safety guidelines for exercising:  Before you start  a new exercise program, talk with your health care provider. Do not exercise so much that you hurt yourself, feel dizzy, or get very short of breath. Wear comfortable clothes and wear shoes with good support. Drink plenty of water while you exercise to prevent dehydration or heat stroke. Work out until your breathing and your heartbeat get faster.        Depression Screen     05/27/2024   10:38 AM 01/11/2024    2:16 PM 12/09/2023    9:48 AM 07/08/2023    9:55 AM 05/27/2023    1:09 PM 11/21/2022   10:27 AM 05/26/2022   11:51 AM  PHQ 2/9 Scores  PHQ - 2 Score 0 0 0 0 0 0 0  PHQ- 9 Score  0  0  0     Fall Risk     05/27/2024   10:34 AM 01/11/2024    2:11 PM 12/09/2023    9:47 AM 07/08/2023    9:54 AM 05/27/2023    1:08 PM  Fall Risk   Falls in the past year? 0 0 0 0 0  Number falls in past yr: 0 0 0 0 0  Injury with Fall? 0 0 0 0 0  Risk for fall due to : No Fall Risks No Fall Risks No Fall Risks No Fall Risks No Fall Risks  Follow up Falls evaluation completed Falls evaluation completed Falls evaluation completed Education provided Falls prevention discussed    MEDICARE RISK AT HOME:  Medicare Risk at Home Any stairs in or around the home?: Yes If so, are there any without handrails?: Yes Home free of loose throw rugs in walkways, pet beds, electrical cords, etc?: Yes Adequate lighting in your home to reduce risk of falls?: Yes Life alert?: No Use of a cane, walker or w/c?: No Grab bars in the bathroom?: Yes Shower chair or bench in shower?: No Elevated toilet seat or a handicapped toilet?: Yes  TIMED UP AND GO:  Was the test performed?  no  Cognitive Function: 6CIT completed        05/27/2024   10:39 AM 05/27/2023    1:11 PM 05/22/2022    4:03 PM 05/16/2020    2:35 PM 05/02/2019    2:24 PM  6CIT Screen  What Year? 0 points 0 points 0 points 0 points 0 points  What month? 0 points 0 points 0 points 0 points 0 points   What time? 0 points 0 points 0 points 0 points 0 points  Count back from 20 0 points 0 points 0 points 0 points 0 points  Months in reverse 0 points 0 points 0 points 0 points 0 points  Repeat phrase 0 points 0 points 0 points 0 points 0 points  Total Score 0 points 0 points 0 points 0 points 0 points    Immunizations Immunization History  Administered Date(s) Administered   Fluad Quad(high Dose 65+) 08/09/2019   Fluad Trivalent(High Dose 65+) 07/08/2023   PFIZER(Purple Top)SARS-COV-2 Vaccination 06/27/2020, 07/18/2020   Pneumococcal Conjugate-13 05/02/2019   Pneumococcal Polysaccharide-23 05/31/2020   Td 05/26/2022   Tdap 04/21/2011   Zoster Recombinant(Shingrix ) 10/19/2018, 04/11/2019   Zoster, Live 05/14/2011    Screening Tests Health Maintenance  Topic Date Due   COVID-19 Vaccine (3 - Pfizer risk series) 08/15/2020   DEXA SCAN  12/13/2023   MAMMOGRAM  05/25/2024   INFLUENZA VACCINE  05/13/2024   Colonoscopy  07/17/2024   Medicare Annual Wellness (AWV)  05/27/2025   DTaP/Tdap/Td (  3 - Td or Tdap) 05/26/2032   Pneumococcal Vaccine: 50+ Years  Completed   Hepatitis C Screening  Completed   Zoster Vaccines- Shingrix   Completed   HPV VACCINES  Aged Out   Meningococcal B Vaccine  Aged Out    Health Maintenance  Health Maintenance Due  Topic Date Due   COVID-19 Vaccine (3 - Pfizer risk series) 08/15/2020   DEXA SCAN  12/13/2023   MAMMOGRAM  05/25/2024   INFLUENZA VACCINE  05/13/2024   Colonoscopy  07/17/2024   Health Maintenance Items Addressed: See Nurse Notes at the end of this note  Additional Screening:  Vision Screening: Recommended annual ophthalmology exams for early detection of glaucoma and other disorders of the eye. Would you like a referral to an eye doctor? No    Dental Screening: Recommended annual dental exams for proper oral hygiene  Community Resource Referral / Chronic Care Management: CRR required this visit?  No   CCM required this visit?   No   Plan:    I have personally reviewed and noted the following in the patient's chart:   Medical and social history Use of alcohol, tobacco or illicit drugs  Current medications and supplements including opioid prescriptions. Patient is not currently taking opioid prescriptions. Functional ability and status Nutritional status Physical activity Advanced directives List of other physicians Hospitalizations, surgeries, and ER visits in previous 12 months Vitals Screenings to include cognitive, depression, and falls Referrals and appointments  In addition, I have reviewed and discussed with patient certain preventive protocols, quality metrics, and best practice recommendations. A written personalized care plan for preventive services as well as general preventive health recommendations were provided to patient.   Ozie Ned, CMA   05/27/2024   After Visit Summary: (MyChart) Due to this being a telephonic visit, the after visit summary with patients personalized plan was offered to patient via MyChart   Notes: PCP Follow Up Recommendations: Pt is aware and due the following: colonscopy, dexa, mammogram per pt would make appt after pcp's visit

## 2024-05-27 NOTE — Patient Instructions (Signed)
 [name] [name] , Thank you for taking time out of your busy schedule to complete your Annual Wellness Visit with me. I enjoyed our conversation and look forward to speaking with you again next year. I, as well as your care team,  appreciate your ongoing commitment to your health goals. Please review the following plan we discussed and let me know if I can assist you in the future. Your Game plan/ To Do List    Referrals: If you haven't heard from the office you've been referred to, please reach out to them at the phone provided.   Follow up Visits: We will see or speak with you next year for your Next Medicare AWV with our clinical staff on 05/29/25 at  Have you seen your provider in the last 6 months (3 months if uncontrolled diabetes)? Yes  Clinician Recommendations:  Aim for 30 minutes of exercise or brisk walking, 6-8 glasses of water, and 5 servings of fruits and vegetables each day.       This is a list of the screenings recommended for you:  Health Maintenance  Topic Date Due   COVID-19 Vaccine (3 - Pfizer risk series) 08/15/2020   DEXA scan (bone density measurement)  12/13/2023   Mammogram  05/25/2024   Medicare Annual Wellness Visit  05/26/2024   Flu Shot  05/13/2024   Colon Cancer Screening  07/17/2024   DTaP/Tdap/Td vaccine (3 - Td or Tdap) 05/26/2032   Pneumococcal Vaccine for age over 56  Completed   Hepatitis C Screening  Completed   Zoster (Shingles) Vaccine  Completed   HPV Vaccine  Aged Out   Meningitis B Vaccine  Aged Out    Advanced directives: (Copy Requested) Please bring a copy of your health care power of attorney and living will to the office to be added to your chart at your convenience. You can mail to [facility] [address] [place], [place] [address] or email to [email or web address] Advance Care Planning is important because it:  [x]  Makes sure you receive the medical care that is consistent with your values, goals, and  preferences  [x]  It provides guidance to your family and loved ones and reduces their decisional burden about whether or not they are making the right decisions based on your wishes.  Follow the link provided in your after visit summary or read over the paperwork we have mailed to you to help you started getting your Advance Directives in place. If you need assistance in completing these, please reach out to us  so that we can help you!  See attachments for Preventive Care and Fall Prevention Tips.

## 2024-05-31 DIAGNOSIS — Z1231 Encounter for screening mammogram for malignant neoplasm of breast: Secondary | ICD-10-CM | POA: Diagnosis not present

## 2024-05-31 LAB — HM MAMMOGRAPHY

## 2024-06-01 ENCOUNTER — Other Ambulatory Visit: Payer: Self-pay | Admitting: Gastroenterology

## 2024-06-01 DIAGNOSIS — R768 Other specified abnormal immunological findings in serum: Secondary | ICD-10-CM

## 2024-06-01 DIAGNOSIS — R748 Abnormal levels of other serum enzymes: Secondary | ICD-10-CM

## 2024-06-02 ENCOUNTER — Ambulatory Visit (INDEPENDENT_AMBULATORY_CARE_PROVIDER_SITE_OTHER): Admitting: Gastroenterology

## 2024-06-02 ENCOUNTER — Encounter: Payer: Self-pay | Admitting: Gastroenterology

## 2024-06-02 DIAGNOSIS — R748 Abnormal levels of other serum enzymes: Secondary | ICD-10-CM

## 2024-06-02 DIAGNOSIS — R768 Other specified abnormal immunological findings in serum: Secondary | ICD-10-CM

## 2024-06-02 MED ORDER — URSODIOL 250 MG PO TABS: 500.0000 mg | ORAL_TABLET | Freq: Every day | ORAL | 0 refills | Status: DC

## 2024-06-02 NOTE — Progress Notes (Signed)
 Chief Complaint: follow-up, elevated liver function test Primary GI Doctor: (previously Dr. Teressa) Dr. Charlanne  HPI:  Patient is a  73  year old female patient with past medical history of anxiety, GERD, hyperlipidemia, and hypothyroidism,who was referred to me by Jolinda Norene HERO, DO on 01/15/24 for a complaint of elevated liver function test.    04/21/24 seen by Rheumatology- Per note : urine protein creatinine ratio normal, ANA 1: 1280 NH, ENA (SCL 70, RNP, Smith, SSA, SSB, dsDNA) negative, anticardiolipin negative, beta-2  GP 1 negative, C3-C4 normal, sed rate 2.  TPO negative, thyroglobulin antibody negative.   Interval History    Patient presents for follow-up on elevated liver function test. She was recently placed on Ursodiol  500 mg twice daily which has helped with the intermittent itching. Patient reports since she has started the Ursodiol  she has had issues with dyspepsia and burping. She started taking famotidine  prn. Her Lipitor has been stopped since workup started due to concerns it Mohsin Crum have be related. She has follow-up with cardiologist next month to discuss alternative options.  Denies abdominal pain, nausea, or vomiting. No dark urine or clay colored stool. No jaundiced skin. No blood in stool.  She also saw rheumatology for elevated ANA, follow-up in 1 year.  No family history of liver disease or CA. Family history of brain tumor in brother and lung CA in father.   No alcohol use. No tylenol  use.   History of osteopenia, only on Calcium  supplements.   Wt Readings from Last 3 Encounters:  06/02/24 140 lb (63.5 kg)  05/27/24 139 lb (63 kg)  05/17/24 139 lb 3.2 oz (63.1 kg)    Past Medical History:  Diagnosis Date   Allergy    Anxiety    Arthritis    mild hips - left worse   BCC (basal cell carcinoma of skin) 07/31/2016   Superficial Bcc left shoulder   Cataract    forming   Elevated liver enzymes    GERD (gastroesophageal reflux disease)    History of cold  sores    Hyperlipidemia    Osteopenia    Rosacea    SVT (supraventricular tachycardia) (HCC)    past hx - no issues   Thyroid  disease    hypothyroid   Past Surgical History:  Procedure Laterality Date   COLONOSCOPY     07-21-2011   skin cancer removal     tiny per pt   TUBAL LIGATION     WISDOM TOOTH EXTRACTION     Current Outpatient Medications  Medication Sig Dispense Refill   azelastine  (ASTELIN ) 0.1 % nasal spray Place 1 spray into both nostrils 2 (two) times daily. Use in each nostril as directed 30 mL 12   Calcium  Carbonate-Vitamin D  (CALCIUM  500/D PO) Take by mouth.     clobetasol  ointment (TEMOVATE ) 0.05 % Apply to affected areas on external vagina nightly as needed for lichen sclerosis 45 g 1   escitalopram  (LEXAPRO ) 10 MG tablet Take 1 tablet (10 mg total) by mouth daily. 90 tablet 3   levothyroxine  (SYNTHROID ) 50 MCG tablet TAKE 1 TABLET (50 MCG TOTAL) BY MOUTH DAILY. DOSE CHANGE 90 tablet 0   Multiple Vitamins-Minerals (PRESERVISION AREDS 2 PO) Take by mouth.     VITAMIN D  PO Take 5,000 Units by mouth daily.     ursodiol  (URSO ) 250 MG tablet Take 2 tablets (500 mg total) by mouth daily. 60 tablet 0   No current facility-administered medications for this visit.   Allergies  as of 06/02/2024 - Review Complete 06/02/2024  Allergen Reaction Noted   Bactrim  [sulfamethoxazole -trimethoprim ] Nausea Only 01/12/2024   Family History  Problem Relation Age of Onset   Colon polyps Mother    Glaucoma Mother    Cerebral aneurysm Mother    Macular degeneration Mother    Thyroid  disease Mother    Stroke Father    Hypertension Father    Lung cancer Father    Hypertension Brother    Brain cancer Brother    Heart attack Maternal Uncle    Heart attack Paternal Uncle    Migraines Daughter    Colon cancer Neg Hx    Stomach cancer Neg Hx    Esophageal cancer Neg Hx    Rectal cancer Neg Hx    Review of Systems:    Constitutional: No weight loss, fever, chills, weakness or  fatigue HEENT: Eyes: No change in vision               Ears, Nose, Throat:  No change in hearing or congestion Skin: No rash or itching Cardiovascular: No chest pain, chest pressure or palpitations   Respiratory: No SOB or cough Gastrointestinal: See HPI and otherwise negative Genitourinary: No dysuria or change in urinary frequency Neurological: No headache, dizziness or syncope Musculoskeletal: No new muscle or joint pain Hematologic: No bleeding or bruising Psychiatric: No history of depression or anxiety    Physical Exam:  Vital signs: BP 118/70   Pulse 73   Ht 5' 5 (1.651 m)   Wt 140 lb (63.5 kg)   BMI 23.30 kg/m   Constitutional:   Pleasant  female appears to be in NAD, Well developed, Well nourished, alert and cooperative. Eyes:   PEERL, EOMI. No icterus. Conjunctiva pink Throat: Oral cavity and pharynx without inflammation, swelling or lesion.  Respiratory: Respirations even and unlabored. Lungs clear to auscultation bilaterally.   No wheezes, crackles, or rhonchi.  Cardiovascular: Normal S1, S2. Regular rate and rhythm. No peripheral edema, cyanosis or pallor.  Gastrointestinal:  Soft, nondistended, nontender. No rebound or guarding. Normal bowel sounds. No appreciable masses or hepatomegaly. Rectal:  Not performed.  Msk:  Symmetrical without gross deformities. Without edema, no deformity or joint abnormality.  Neurologic:  Alert and  oriented x4;  grossly normal neurologically.  Skin:   Dry and intact without significant lesions or rashes. Psychiatric: Oriented to person, place and time. Demonstrates good judgement and reason without abnormal affect or behaviors.  RELEVANT LABS AND IMAGING: CBC    Latest Ref Rng & Units 04/12/2024    6:50 AM 01/26/2024   10:11 AM 11/21/2022    2:17 PM  CBC  WBC 4.0 - 10.5 K/uL 6.9  7.1  5.8   Hemoglobin 12.0 - 15.0 g/dL 86.6  86.1  87.0   Hematocrit 36.0 - 46.0 % 41.0  41.3  38.9   Platelets 150 - 400 K/uL 292  305.0  226       CMP     Latest Ref Rng & Units 05/25/2024   10:36 AM 03/16/2024    8:26 AM 02/22/2024    9:40 AM  CMP  Glucose 65 - 99 mg/dL  89    BUN 7 - 25 mg/dL  18    Creatinine 9.39 - 1.00 mg/dL  8.99    Sodium 864 - 853 mmol/L  141    Potassium 3.5 - 5.3 mmol/L  4.6    Chloride 98 - 110 mmol/L  103    CO2 20 - 32  mmol/L  30    Calcium  8.6 - 10.4 mg/dL  9.5    Total Protein 6.0 - 8.3 g/dL 6.7  6.7  6.9   Total Bilirubin 0.2 - 1.2 mg/dL 0.7  0.7  0.7   Alkaline Phos 39 - 117 U/L 39 - 117 U/L 145    145   155   AST 0 - 37 U/L 21  41  53   ALT 0 - 35 U/L 30  138  98      Lab Results  Component Value Date   TSH 6.890 (H) 04/11/2024  01/14/24 labs show: Hep A IgM negative, hepatitis B surface antigen negative, hep B C IgM negative, hepatitis B surface antibody quant less than 3.5, hep C virus antibody nonreactive Alk phos 173> 170> 187>171>155>145>145 AST 154> 161> 131>109>53>41 ALT 235> 245> 233>186>98>30 01/26/24 labs show: ANA- positive, AMA- <20, Anti-smooth muscle antibody <20, iron-126, ferritin-40.4, TIBC-361.2,  tTG, total IgA-negative, PT/INR- 1/10.9,CBC, IgG- 1219, hepatic panel function 05/25/24 labs show: GGT 196  Imaging: 04/12/24 liver biopsy IMPRESSION: Successful single needle core biopsy of bland liver parenchyma in the right lobe. A. LIVER, RIGHT LOBE, NEEDLE CORE BIOPSY:  - Portal inflammatory infiltrates with mild ductular reaction.  See comment : The biopsy is adequate for review. There is mild to moderate portal inflammation with mixed infiltrates of lymphocytes and neutrophils with rare eosinophils. There is patchy mild interface hepatitis. There is portal expansion by mild ductular reaction with associated neutrophilic inflammation.  Increased plasma cells are not identified trichrome and reticulin stains show mild portal fibrosis with few fibrous septa. Definite bridging is not seen. Iron stain is negative and PAS-D stain shows no cytoplasmic inclusions.  The  histologic findings are nonspecific.  Diagnostic histologic features of autoimmune hepatitis are not identified.  Differential diagnosis can include drug-induced liver injury and less likely, evolving autoimmune hepatitis.  Clinical and serologic correlation is suggested.   01/15/2024 ultrasound abdomen limited right upper quadrant IMPRESSION: No gallstones or ductal dilatation.  GI procedures: 07/17/2021 colonoscopy with Dr. Teressa, recall 3 years Impression: - Three 3 to 4 mm polyps in the transverse colon and in the ascending colon, removed with a cold snare. Resected and retrieved. - Internal hemorrhoids. - The examination was otherwise normal on direct and retroflexion views. Path:Surgical [P], colon, transverse and ascending, polyp (3) - TUBULAR ADENOMA (4 OF 4 FRAGMENTS) - NO HIGH-GRADE DYSPLASIA OR MALIGNANCY IDENTIFIED 08/23/2018 echo-The estimated ejection fraction was in the range of 65% to 70%.  07/21/2011 colonoscopy with Dr. Debrah Endoscopic impression 3mm sessile polyp in the sigmoid colon Otherwise normal exam  Assessment: Encounter Diagnoses  Name Primary?   Elevated liver enzymes    Elevated antinuclear antibody (ANA) level       73 year old female patient that presents for elevated liver enzymes discovered during yearly lab work in April. Hepatitis panel negative, no immunity to hepatitis B. Recommended hep B series.  Abdominal ultrasound right upper quadrant reveals no gallstones or ductal dilatation.  Liver is within normal limits. Statin was discontinued. ANA- positive, Referred to rheumatology who did full workup, unremarkable.  AMA- <20, Anti-smooth muscle antibody <20, iron-126, ferritin-40.4, TIBC-361.2,  tTG, total IgA-negative, PT/INR- 1/10.9, IgG- 1219,  AST and ALT and trended downward to normal. Alk phosp is still elevated at 145 and GGT 196.Liver biopsy: Mild portal inflammation without any bridging fibrosis.  No evidence of autoimmune hepatitis. Likely  drug-induced. Patient c/o itching and placed on urso  500 mg bid which helped but causing dyspepsia.  Discussed case with Dr. Charlanne and ok to reduce to once daily. Refer to hepatology Dr. Morna Novak for second review.   Pt has history of colonic polyps and due for colon screening in October of this year, recall placed.  Plan: - Decrease Urso  500 mg p.o. to once daily  , refilled -Can use Pepcid  20 mg once to twice daily prn dyspepsia -Recheck LFTs, GGT, in 4 weeks.  -Send for hepatology consultation with Dr. Morna Novak ([facility]). She does have clinic in [place]  -Recall colonoscopy 07/2024  Thank you for the courtesy of this consult. Please call me with any questions or concerns.   Nicol Herbig, FNP-C  Gastroenterology 06/02/2024, 12:08 PM  Cc: Jolinda Norene HERO, DO

## 2024-06-02 NOTE — Patient Instructions (Addendum)
 Decrease Urso  500 mg p.o. to once daily   Recheck labs in 4 weeks  Referral to: Manuelita GRADE. Myrna, MD, MPH Hepatologist [phone]  Can use Pepcid  20mg  once to twice daily as needed  _______________________________________________________  If your blood pressure at your visit was 140/90 or greater, please contact your primary care physician to follow up on this.  _______________________________________________________  If you are age 71 or older, your body mass index should be between 23-30. Your Body mass index is 23.3 kg/m. If this is out of the aforementioned range listed, please consider follow up with your Primary Care Provider.  If you are age 65 or younger, your body mass index should be between 19-25. Your Body mass index is 23.3 kg/m. If this is out of the aformentioned range listed, please consider follow up with your Primary Care Provider.   ________________________________________________________  The [facility] providers would like to encourage you to use MYCHART to communicate with providers for non-urgent requests or questions.  Due to long hold times on the telephone, sending your provider a message by [facility] may be a faster and more efficient way to get a response.  Please allow 48 business hours for a response.  Please remember that this is for non-urgent requests.  _______________________________________________________  [facility] is using a team-based approach to care.  Your team is made up of your doctor and two to three APPS. Our APPS (Nurse Practitioners and Physician Assistants) work with your physician to ensure care continuity for you. They are fully qualified to address your health concerns and develop a treatment plan. They communicate directly with your gastroenterologist to care for you. Seeing the Advanced Practice Practitioners on your physician's team can help you by facilitating care more promptly, often allowing for earlier appointments,  access to diagnostic testing, procedures, and other specialty referrals.   Thank you for trusting me with your gastrointestinal care. Deanna May, RNP

## 2024-06-09 NOTE — Telephone Encounter (Signed)
 Patient called in to discuss referral. The provider she listed is established with [facility] & she needed a hepatology specialist. Advised she reach back out to insurance to discuss is there is a specialist within [facility] that is covered. Pt will let us  know.

## 2024-06-14 ENCOUNTER — Telehealth: Payer: Self-pay | Admitting: Gastroenterology

## 2024-06-14 DIAGNOSIS — R768 Other specified abnormal immunological findings in serum: Secondary | ICD-10-CM

## 2024-06-14 DIAGNOSIS — R748 Abnormal levels of other serum enzymes: Secondary | ICD-10-CM

## 2024-06-14 NOTE — Telephone Encounter (Signed)
 Inbound call from patient stating that she spoke with [facility] hepatology and they recommended her to call us  back and makes sure that when we send over pt referral to send everything that we have done to treat the pt including address, phone and insurance. Patient is requesting to have someone call her back in regards to her message. Please advise.

## 2024-06-14 NOTE — Telephone Encounter (Signed)
 Patient requesting to be referred to Alm Hoose MD with [facility]. For a liver specialist.   Fax number [phone]. Please advise   Thank you

## 2024-06-16 NOTE — Telephone Encounter (Signed)
Patient returned your call, requesting a call back.  

## 2024-06-16 NOTE — Telephone Encounter (Signed)
 Spoke with patient to confirm correct doctor for referral. Stated Dr. Elisa is the only liver specialist covered in the state by her insurance. Advised her if referral is denied for any reason then to please let us  know. Referral faxed to number provided.

## 2024-06-16 NOTE — Telephone Encounter (Signed)
 Left message for patient to call back

## 2024-06-16 NOTE — Telephone Encounter (Signed)
 See patient message from 06/03/34.

## 2024-06-16 NOTE — Telephone Encounter (Signed)
 Referral sent to Dr. Alm Hoose (Liver Specialist) on 06/16/24

## 2024-06-27 NOTE — Telephone Encounter (Signed)
Patient calling in regards to previous note. Please advise.

## 2024-06-27 NOTE — Telephone Encounter (Signed)
 Elspeth this is something Nyla took care of recently. See Mychart message from 06/03/24.

## 2024-06-28 NOTE — Telephone Encounter (Signed)
 Please see notes below. Pt stated that she was referred to Dr. Alm Hoose (Liver Specialist) on 06/16/24 and has not heard from their office.  Pt stated that she called their  office and was notified that they do not have a referral from us  on file.  Please follow up on referral .

## 2024-06-29 NOTE — Addendum Note (Signed)
 Addended by: WILL POWELL CROME on: 06/29/2024 04:48 PM   Modules accepted: Orders

## 2024-06-30 ENCOUNTER — Other Ambulatory Visit (INDEPENDENT_AMBULATORY_CARE_PROVIDER_SITE_OTHER)

## 2024-06-30 ENCOUNTER — Telehealth: Payer: Self-pay | Admitting: Gastroenterology

## 2024-06-30 DIAGNOSIS — R768 Other specified abnormal immunological findings in serum: Secondary | ICD-10-CM

## 2024-06-30 DIAGNOSIS — R748 Abnormal levels of other serum enzymes: Secondary | ICD-10-CM

## 2024-06-30 LAB — HEPATIC FUNCTION PANEL
ALT: 176 U/L — ABNORMAL HIGH (ref 0–35)
AST: 59 U/L — ABNORMAL HIGH (ref 0–37)
Albumin: 3.9 g/dL (ref 3.5–5.2)
Alkaline Phosphatase: 295 U/L — ABNORMAL HIGH (ref 39–117)
Bilirubin, Direct: 0.3 mg/dL (ref 0.0–0.3)
Total Bilirubin: 1.1 mg/dL (ref 0.2–1.2)
Total Protein: 6.9 g/dL (ref 6.0–8.3)

## 2024-06-30 LAB — GAMMA GT: GGT: 416 U/L — ABNORMAL HIGH (ref 7–51)

## 2024-06-30 NOTE — Telephone Encounter (Signed)
 Called [facility] & made their office aware referral is no longer needed since patient will be seeing Dr. Elisa in network with her insurance.

## 2024-06-30 NOTE — Telephone Encounter (Signed)
 Please take attention to previous note and advise at [phone]

## 2024-06-30 NOTE — Telephone Encounter (Signed)
 Referral was received by Dr. Elisa office. Patient informed.

## 2024-06-30 NOTE — Telephone Encounter (Signed)
 Faxed referral again to number provided below.

## 2024-06-30 NOTE — Telephone Encounter (Signed)
 Received a call from [facility] regarding a referral sent over to [facility], Is requesting fu call to confirm referral. Please review and advise  Thank you

## 2024-07-01 ENCOUNTER — Ambulatory Visit: Payer: Self-pay | Admitting: Gastroenterology

## 2024-07-01 ENCOUNTER — Telehealth: Payer: Self-pay | Admitting: Gastroenterology

## 2024-07-01 NOTE — Telephone Encounter (Signed)
 Attempted to call patient on phone number provided on file unable to get a hold of patient left voicemail to discuss lab results.  Will go ahead and send MyChart message

## 2024-07-04 ENCOUNTER — Other Ambulatory Visit: Payer: Self-pay | Admitting: Gastroenterology

## 2024-07-04 ENCOUNTER — Telehealth: Payer: Self-pay | Admitting: Gastroenterology

## 2024-07-04 DIAGNOSIS — R748 Abnormal levels of other serum enzymes: Secondary | ICD-10-CM

## 2024-07-04 DIAGNOSIS — R768 Other specified abnormal immunological findings in serum: Secondary | ICD-10-CM

## 2024-07-04 NOTE — Telephone Encounter (Signed)
 Inbound call from patient stating she was referred to [facility] and she's tried calling multiple numbers to go ahead and schedule and has been told they haven't received a referral. Patient would like to speak to nurse. Please advise  Thank you

## 2024-07-04 NOTE — Telephone Encounter (Signed)
 Inbound call from [facility] calling in regards to a referral that was sent to them. Onalee stated that they are with Surgery and the patient needs to be referred to hepatology. A good call number for them is [phone]. Please advise.

## 2024-07-04 NOTE — Telephone Encounter (Addendum)
 Pt chart was reviewed and noted the previous documentation.  Phebe Planas M routed this conversation to Marc Nyla LABOR, RN Phebe Planas HERO [facility]   07/04/24 10:00 AM Note Inbound call from Tamisha calling in regards to a referral that was sent to them. Onalee stated that they are with Surgery and the patient needs to be referred to hepatology. A good call number for them is [phone]. Please advise.      Spoke with Tamisha from Dr. Alm Hoose office who stated that Dr. Hoose is a Hepatopancreatobiliary  Surgeon, Onalee stated that the pt needed a referral to hepatology. Pt was made aware and stated that this is the name of the Dr. Sharman her insurance company recommended. Pt was provided Stephane Lain NP information and requested her to call her insurance and clarify if she was in network or please clarify who the pt could see with Hepatology. Pt verbalized understanding with all questions answered.

## 2024-07-06 NOTE — Telephone Encounter (Addendum)
 Appt made for 11-02-24 at 130pm with [facility]

## 2024-07-12 ENCOUNTER — Ambulatory Visit: Admitting: Family Medicine

## 2024-07-12 ENCOUNTER — Encounter: Payer: Self-pay | Admitting: Family Medicine

## 2024-07-12 ENCOUNTER — Telehealth: Payer: Self-pay | Admitting: Family Medicine

## 2024-07-12 VITALS — BP 105/71 | HR 71 | Temp 97.4°F | Ht 65.0 in | Wt 138.1 lb

## 2024-07-12 DIAGNOSIS — E7841 Elevated Lipoprotein(a): Secondary | ICD-10-CM

## 2024-07-12 DIAGNOSIS — Z23 Encounter for immunization: Secondary | ICD-10-CM | POA: Diagnosis not present

## 2024-07-12 DIAGNOSIS — M8589 Other specified disorders of bone density and structure, multiple sites: Secondary | ICD-10-CM

## 2024-07-12 DIAGNOSIS — E039 Hypothyroidism, unspecified: Secondary | ICD-10-CM

## 2024-07-12 DIAGNOSIS — Z0001 Encounter for general adult medical examination with abnormal findings: Secondary | ICD-10-CM

## 2024-07-12 DIAGNOSIS — K2100 Gastro-esophageal reflux disease with esophagitis, without bleeding: Secondary | ICD-10-CM

## 2024-07-12 DIAGNOSIS — R7989 Other specified abnormal findings of blood chemistry: Secondary | ICD-10-CM

## 2024-07-12 DIAGNOSIS — Z0000 Encounter for general adult medical examination without abnormal findings: Secondary | ICD-10-CM

## 2024-07-12 MED ORDER — FAMOTIDINE 20 MG PO TABS: 20.0000 mg | ORAL_TABLET | Freq: Two times a day (BID) | ORAL | 3 refills | Status: AC | PRN

## 2024-07-12 NOTE — Progress Notes (Signed)
 [name] [name] is a 73 y.o. female presents to office today for annual physical exam examination.    She voices ongoing concern about her elevation in liver function tests.  She notes that at this time the leading concern is that may be her use of statin medication for 3 decades had caused her injury.  She finds this perplexing mostly because even though they seem to go down after she discontinued the medication, they went back up again even though she stayed off of it.  She more so thinks that perhaps is related to Septra  which she had taken just a few weeks prior to us  discovering this elevation in her liver function test.  She reports having negative workup for autoimmune hepatitis and viral hepatitis.  She has no symptoms except for itching but this is relieved by the ursodiol  which is being prescribed 2 tablets twice daily.  She reports some constipation associated with that medication but other wise no other symptoms.  Her appointment with the hepatologist is not until January and she is little nervous about this because she wants to figure out what is going on sooner.  She is trying to patiently wait.  She has been avoiding anything that might cause liver injury.  She is not on any supplements.  She is compliant with all of her other medications.  Denies any changes to her levothyroxine  pill/manufacture   Occupation: retired, Substance use: none Health Maintenance Due  Topic Date Due   DEXA SCAN  12/13/2023   Colonoscopy  07/17/2024    Immunization History  Administered Date(s) Administered   Fluad Quad(high Dose 65+) 08/09/2019   Fluad Trivalent(High Dose 65+) 07/08/2023   INFLUENZA, HIGH DOSE SEASONAL PF 07/12/2024   PFIZER(Purple Top)SARS-COV-2 Vaccination 06/27/2020, 07/18/2020   Pneumococcal Conjugate-13 05/02/2019   Pneumococcal Polysaccharide-23 05/31/2020   Td 05/26/2022   Tdap 04/21/2011   Zoster Recombinant(Shingrix ) 10/19/2018, 04/11/2019   Zoster, Live 05/14/2011    Past Medical History:  Diagnosis Date   Allergy    Anxiety    Arthritis    mild hips - left worse   BCC (basal cell carcinoma of skin) 07/31/2016   Superficial Bcc left shoulder   Cataract    forming   Elevated liver enzymes    GERD (gastroesophageal reflux disease)    History of cold sores    Hyperlipidemia    Liver fibrosis    Osteopenia    Rosacea    SVT (supraventricular tachycardia)    past hx - no issues   Syncope 08/22/2018   Thyroid  disease    hypothyroid   Social History   Socioeconomic History   Marital status: Married    Spouse name: Todd    Number of children: 1   Years of education: 12   Highest education level: High school graduate  Occupational History   Occupation: Paramedic: [employer]  Tobacco Use   Smoking status: Never    Passive exposure: Never   Smokeless tobacco: Never  Vaping Use   Vaping status: Never Used  Substance and Sexual Activity   Alcohol use: No   Drug use: No   Sexual activity: Yes    Partners: Male  Other Topics Concern   Not on file  Social History Narrative   Patient is married.  Her husband recently retired last March and they have been trying to travel more.   She has a daughter who lives locally.  She has grandchildren.  She is active  in her church and tries to stay physically active.   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 years and is enjoying retirement quite a bit   Social Drivers of Corporate investment banker Strain: Low Risk  (05/27/2024)   Overall Financial Resource Strain (CARDIA)    Difficulty of Paying Living Expenses: Not hard at all  Food Insecurity: No Food Insecurity (05/27/2024)   Hunger Vital Sign    Worried About Running Out of Food in the Last Year: Never true    Ran Out of Food in the Last Year: Never true  Transportation Needs: No Transportation Needs (05/27/2024)    PRAPARE - Administrator, Civil Service (Medical): No    Lack of Transportation (Non-Medical): No  Physical Activity: Inactive (05/27/2024)   Exercise Vital Sign    Days of Exercise per Week: 0 days    Minutes of Exercise per Session: 0 min  Stress: No Stress Concern Present (05/27/2024)   [employer] of Occupational Health - Occupational Stress Questionnaire    Feeling of Stress: Not at all  Social Connections: Socially Integrated (05/27/2024)   Social Connection and Isolation Panel    Frequency of Communication with Friends and Family: More than three times a week    Frequency of Social Gatherings with Friends and Family: More than three times a week    Attends Religious Services: More than 4 times per year    Active Member of [employer] or Organizations: Yes    Attends Engineer, structural: More than 4 times per year    Marital Status: Married  Catering manager Violence: Not At Risk (05/27/2024)   Humiliation, Afraid, Rape, and Kick questionnaire    Fear of Current or Ex-Partner: No    Emotionally Abused: No    Physically Abused: No    Sexually Abused: No   Past Surgical History:  Procedure Laterality Date   COLONOSCOPY     07-21-2011   skin cancer removal     tiny per pt   TUBAL LIGATION     WISDOM TOOTH EXTRACTION     Family History  Problem Relation Age of Onset   Colon polyps Mother    Glaucoma Mother    Cerebral aneurysm Mother    Macular degeneration Mother    Thyroid  disease Mother    Stroke Father    Hypertension Father    Lung cancer Father    Hypertension Brother    Brain cancer Brother    Heart attack Maternal Uncle    Heart attack Paternal Uncle    Migraines Daughter    Colon cancer Neg Hx    Stomach cancer Neg Hx    Esophageal cancer Neg Hx    Rectal cancer Neg Hx     Current Outpatient Medications:    azelastine  (ASTELIN ) 0.1 % nasal spray, Place 1 spray into both nostrils 2 (two) times daily. Use in each nostril as directed,  Disp: 30 mL, Rfl: 12   Calcium  Carbonate-Vitamin D  (CALCIUM  500/D PO), Take by mouth., Disp: , Rfl:    clobetasol  ointment (TEMOVATE ) 0.05 %, Apply to affected areas on external vagina nightly as needed for lichen sclerosis, Disp: 45 g, Rfl: 1   escitalopram  (LEXAPRO ) 10 MG tablet, Take 1 tablet (10 mg total) by mouth daily., Disp: 90 tablet, Rfl: 3   famotidine  (PEPCID ) 20 MG tablet, Take 1  tablet (20 mg total) by mouth 2 (two) times daily as needed for heartburn or indigestion., Disp: 180 tablet, Rfl: 3   levothyroxine  (SYNTHROID ) 50 MCG tablet, TAKE 1 TABLET (50 MCG TOTAL) BY MOUTH DAILY. DOSE CHANGE, Disp: 90 tablet, Rfl: 0   ursodiol  (ACTIGALL ) 250 MG tablet, TAKE 2 TABLETS BY MOUTH 2 TIMES DAILY., Disp: 120 tablet, Rfl: 3   VITAMIN D  PO, Take 5,000 Units by mouth daily., Disp: , Rfl:    Multiple Vitamins-Minerals (PRESERVISION AREDS 2 PO), Take by mouth. (Patient not taking: Reported on 07/12/2024), Disp: , Rfl:   Allergies  Allergen Reactions   Bactrim  [Sulfamethoxazole -Trimethoprim ] Nausea Only    Nausea and fatigue     ROS: Review of Systems Pertinent items noted in HPI and remainder of comprehensive ROS otherwise negative.    Physical exam BP 105/71   Pulse 71   Temp (!) 97.4 F (36.3 C)   Ht 5' 5 (1.651 m)   Wt 138 lb 2 oz (62.7 kg)   SpO2 97%   BMI 22.99 kg/m  General appearance: alert, cooperative, appears stated age, and no distress Head: Normocephalic, without obvious abnormality, atraumatic Eyes: negative findings: lids and lashes normal, conjunctivae and sclerae normal, corneas clear, and pupils equal, round, reactive to light and accomodation Ears: normal TM's and external ear canals both ears Nose: Nares normal. Septum midline. Mucosa normal. No drainage or sinus tenderness. Throat: lips, mucosa, and tongue normal; teeth and gums normal Neck: no adenopathy, no carotid bruit, supple, symmetrical, trachea midline, and thyroid  not enlarged, symmetric, no  tenderness/mass/nodules Back: symmetric, no curvature. ROM normal. No CVA tenderness. Lungs: clear to auscultation bilaterally Heart: regular rate and rhythm, S1, S2 normal, no murmur, click, rub or gallop Abdomen: soft, non-tender; bowel sounds normal; no masses,  no organomegaly Extremities: extremities normal, atraumatic, no cyanosis or edema Pulses: 2+ and symmetric Skin: Skin color, texture, turgor normal. No rashes or lesions Lymph nodes: Cervical, supraclavicular, and axillary nodes normal. Neurologic: Grossly normal      07/12/2024   10:06 AM 05/27/2024   10:38 AM 01/11/2024    2:16 PM  Depression screen PHQ 2/9  Decreased Interest 0 0 0  Down, Depressed, Hopeless 0 0 0  PHQ - 2 Score 0 0 0  Altered sleeping 0  0  Tired, decreased energy 0  0  Change in appetite 0  0  Feeling bad or failure about yourself  0  0  Trouble concentrating 0  0  Moving slowly or fidgety/restless 0  0  Suicidal thoughts 0  0  PHQ-9 Score 0  0  Difficult doing work/chores Not difficult at all  Not difficult at all      07/12/2024   10:06 AM 01/11/2024    2:16 PM 12/09/2023    9:48 AM 07/08/2023    9:55 AM  GAD 7 : Generalized Anxiety Score  Nervous, Anxious, on Edge 0 0 0 0  Control/stop worrying 0 0 0 0  Worry too much - different things 0 0 0 0  Trouble relaxing 0 0 0 0  Restless 0 0 0 0  Easily annoyed or irritable 0 0 0 0  Afraid - awful might happen 0 0 0 0  Total GAD 7 Score 0 0 0 0  Anxiety Difficulty Not difficult at all Not difficult at all  Not difficult at all    Recent Results (from the past 2160 hours)  Alkaline phosphatase     Status: Abnormal   Collection Time:  05/25/24 10:36 AM  Result Value Ref Range   Alkaline Phosphatase 145 (H) 39 - 117 U/L  INR/PT     Status: None   Collection Time: 05/25/24 10:36 AM  Result Value Ref Range   INR 1.0 0.8 - 1.0 ratio   Prothrombin Time 10.6 9.6 - 13.1 sec  Gamma GT     Status: Abnormal   Collection Time: 05/25/24 10:36 AM   Result Value Ref Range   GGT 196 (H) 7 - 51 U/L  Hepatic function panel     Status: Abnormal   Collection Time: 05/25/24 10:36 AM  Result Value Ref Range   Total Bilirubin 0.7 0.2 - 1.2 mg/dL   Bilirubin, Direct 0.1 0.0 - 0.3 mg/dL   Alkaline Phosphatase 145 (H) 39 - 117 U/L   AST 21 0 - 37 U/L   ALT 30 0 - 35 U/L   Total Protein 6.7 6.0 - 8.3 g/dL   Albumin 3.6 3.5 - 5.2 g/dL  HM MAMMOGRAPHY     Status: None   Collection Time: 05/31/24  9:14 AM  Result Value Ref Range   HM Mammogram 0-4 Bi-Rad 0-4 Bi-Rad, Self Reported Normal    Comment: ABSTRACTED BY HIM  Gamma GT     Status: Abnormal   Collection Time: 06/30/24 11:14 AM  Result Value Ref Range   GGT 416 (H) 7 - 51 U/L  Hepatic function panel     Status: Abnormal   Collection Time: 06/30/24 11:14 AM  Result Value Ref Range   Total Bilirubin 1.1 0.2 - 1.2 mg/dL   Bilirubin, Direct 0.3 0.0 - 0.3 mg/dL   Alkaline Phosphatase 295 (H) 39 - 117 U/L   AST 59 (H) 0 - 37 U/L   ALT 176 (H) 0 - 35 U/L   Total Protein 6.9 6.0 - 8.3 g/dL   Albumin 3.9 3.5 - 5.2 g/dL     Assessment/ Plan: Verneita Goldberg here for annual physical exam.   Annual physical exam  Gastroesophageal reflux disease with esophagitis without hemorrhage - Plan: famotidine  (PEPCID ) 20 MG tablet  Elevated liver function tests - Plan: Gamma GT  Hypothyroidism (acquired) - Plan: TSH + free T4, CMP14+EGFR  Elevated lipoprotein(a) - Plan: Lipid Panel, CMP14+EGFR  Osteopenia of multiple sites - Plan: CMP14+EGFR, DG [facility] DEXA  Need for influenza vaccination - Plan: Flu vaccine HIGH DOSE PF(Fluzone Trivalent)   Influenza vaccination administered.  She is established with GI and will continue to follow-up for colonoscopy with Dr. Teressa.  Main concern is the elevation in liver function tests and she has a referral to Dr. Hays in January.  Seems to be having a lot of anxiety surrounding this appointment because she feels it so far off and she worries about  progression of her undiagnosed liver disease.  Reinforced continued avoidance of alcohol, Tylenol  or other medications which may cause a hepatic injury.  Will CC chart to hepatologist.  Check thyroid  levels.  Asymptomatic from a thyroid  standpoint.  Fasting lipid collected.  May need to consider Zetia or Repatha as statins will not be appropriate in this patient at this time, as her 30-year use of this class of medicine is apparently the leading concern as to why she may have had this liver injury to begin with. Hopefully hepatology can weigh in as to which they think is safer for the patient  Plan for DEXA.  She will set this up.  Check calcium  level.  Vitamin D  level checked earlier this year  Counseled on healthy lifestyle choices, including diet (rich in fruits, vegetables and lean meats and low in salt and simple carbohydrates) and exercise (at least 30 minutes of moderate physical activity daily).  Patient to follow up 4-33m to follow up liver discussion  Ermelinda Eckert M. Jolinda, DO

## 2024-07-12 NOTE — Telephone Encounter (Signed)
 Pt needs DEXA Appt

## 2024-07-13 ENCOUNTER — Ambulatory Visit: Payer: Self-pay | Admitting: Family Medicine

## 2024-07-13 DIAGNOSIS — E039 Hypothyroidism, unspecified: Secondary | ICD-10-CM

## 2024-07-13 LAB — CMP14+EGFR
ALT: 78 IU/L — ABNORMAL HIGH (ref 0–32)
AST: 47 IU/L — ABNORMAL HIGH (ref 0–40)
Albumin: 4 g/dL (ref 3.9–4.9)
Alkaline Phosphatase: 238 IU/L — ABNORMAL HIGH (ref 49–135)
BUN/Creatinine Ratio: 22 (ref 12–28)
BUN: 20 mg/dL (ref 8–27)
Bilirubin Total: 0.6 mg/dL (ref 0.0–1.2)
CO2: 24 mmol/L (ref 20–29)
Calcium: 9.6 mg/dL (ref 8.7–10.3)
Chloride: 101 mmol/L (ref 96–106)
Creatinine, Ser: 0.91 mg/dL (ref 0.57–1.00)
Globulin, Total: 2.5 g/dL (ref 1.5–4.5)
Glucose: 92 mg/dL (ref 70–99)
Potassium: 4.6 mmol/L (ref 3.5–5.2)
Sodium: 140 mmol/L (ref 134–144)
Total Protein: 6.5 g/dL (ref 6.0–8.5)
eGFR: 68 mL/min/1.73 (ref >59)

## 2024-07-13 LAB — LIPID PANEL
Chol/HDL Ratio: 4 ratio (ref 0.0–4.4)
Cholesterol, Total: 180 mg/dL (ref 100–199)
HDL: 45 mg/dL (ref >39)
LDL Chol Calc (NIH): 114 mg/dL — ABNORMAL HIGH (ref 0–99)
Triglycerides: 114 mg/dL (ref 0–149)
VLDL Cholesterol Cal: 21 mg/dL (ref 5–40)

## 2024-07-13 LAB — TSH+FREE T4
Free T4: 1.19 ng/dL (ref 0.82–1.77)
TSH: 12.9 u[IU]/mL — AB (ref 0.450–4.500)

## 2024-07-13 LAB — GAMMA GT: GGT: 244 IU/L — AB (ref 0–60)

## 2024-07-13 NOTE — Telephone Encounter (Signed)
 Called and scheduled appt

## 2024-07-26 ENCOUNTER — Encounter: Payer: Self-pay | Admitting: Gastroenterology

## 2024-07-26 ENCOUNTER — Encounter: Admitting: Rheumatology

## 2024-08-01 ENCOUNTER — Other Ambulatory Visit

## 2024-08-03 NOTE — Progress Notes (Signed)
 GGT and alk phos- increasing Plan: -CT liver triphasic (to r/o any masses esp cholangioCa) -Recheck LFTs, GGT, CBC in 4 weeks, just before clinic appointment. -If still elevated, and CT is negative, would consider MRI liver with MRCP -Please make sure she is taking Urso  2 tablets p.o. twice daily for now. -Needs follow-up with Deanna in 4 weeks Send report to family physician

## 2024-08-04 ENCOUNTER — Telehealth: Payer: Self-pay | Admitting: Gastroenterology

## 2024-08-04 ENCOUNTER — Other Ambulatory Visit: Payer: Self-pay

## 2024-08-04 DIAGNOSIS — R7689 Other specified abnormal immunological findings in serum: Secondary | ICD-10-CM

## 2024-08-04 DIAGNOSIS — R748 Abnormal levels of other serum enzymes: Secondary | ICD-10-CM

## 2024-08-04 NOTE — Telephone Encounter (Signed)
 Left message for patient to call back. Refer to result note.

## 2024-08-04 NOTE — Telephone Encounter (Signed)
 Inbound call from patient stating that she was returning a call back to the nurse. Please advise.

## 2024-08-05 NOTE — Telephone Encounter (Signed)
 Can send in Questran 4 g up to twice a day for itching, start with 1 pack daily can cause constipation.  May need to add on MiraLAX but this should help with itching. Patient continues to have slightly elevated liver function on the ursodiol  500 mg twice daily will let Dr. Charlanne decide whether we need to increase this or not

## 2024-08-09 ENCOUNTER — Ambulatory Visit (HOSPITAL_COMMUNITY)
Admission: RE | Admit: 2024-08-09 | Discharge: 2024-08-09 | Disposition: A | Discharge status: Home / Self Care | Source: Ambulatory Visit | Attending: Gastroenterology | Admitting: Gastroenterology

## 2024-08-09 DIAGNOSIS — R748 Abnormal levels of other serum enzymes: Secondary | ICD-10-CM | POA: Diagnosis not present

## 2024-08-09 DIAGNOSIS — K449 Diaphragmatic hernia without obstruction or gangrene: Secondary | ICD-10-CM | POA: Diagnosis not present

## 2024-08-09 DIAGNOSIS — R160 Hepatomegaly, not elsewhere classified: Secondary | ICD-10-CM | POA: Diagnosis not present

## 2024-08-09 DIAGNOSIS — R7689 Other specified abnormal immunological findings in serum: Secondary | ICD-10-CM | POA: Insufficient documentation

## 2024-08-09 DIAGNOSIS — N281 Cyst of kidney, acquired: Secondary | ICD-10-CM | POA: Diagnosis not present

## 2024-08-09 DIAGNOSIS — K5710 Diverticulosis of small intestine without perforation or abscess without bleeding: Secondary | ICD-10-CM | POA: Diagnosis not present

## 2024-08-09 MED ORDER — IOHEXOL 300 MG/ML  SOLN
100.0000 mL | Freq: Once | INTRAMUSCULAR | Status: AC | PRN
  Administered 2024-08-09: 100 mL via INTRAVENOUS

## 2024-08-14 ENCOUNTER — Ambulatory Visit: Payer: Self-pay | Admitting: Gastroenterology

## 2024-08-15 ENCOUNTER — Other Ambulatory Visit: Payer: Self-pay

## 2024-08-15 DIAGNOSIS — R160 Hepatomegaly, not elsewhere classified: Secondary | ICD-10-CM

## 2024-08-15 DIAGNOSIS — R7689 Other specified abnormal immunological findings in serum: Secondary | ICD-10-CM

## 2024-08-15 DIAGNOSIS — R748 Abnormal levels of other serum enzymes: Secondary | ICD-10-CM

## 2024-08-15 DIAGNOSIS — R935 Abnormal findings on diagnostic imaging of other abdominal regions, including retroperitoneum: Secondary | ICD-10-CM

## 2024-08-16 ENCOUNTER — Other Ambulatory Visit: Payer: Self-pay | Admitting: *Deleted

## 2024-08-16 DIAGNOSIS — E039 Hypothyroidism, unspecified: Secondary | ICD-10-CM

## 2024-08-18 ENCOUNTER — Ambulatory Visit (HOSPITAL_COMMUNITY)
Admission: RE | Admit: 2024-08-18 | Discharge: 2024-08-18 | Disposition: A | Discharge status: Home / Self Care | Source: Ambulatory Visit | Attending: Gastroenterology | Admitting: Gastroenterology

## 2024-08-18 ENCOUNTER — Other Ambulatory Visit (INDEPENDENT_AMBULATORY_CARE_PROVIDER_SITE_OTHER)

## 2024-08-18 ENCOUNTER — Other Ambulatory Visit: Payer: Self-pay | Admitting: Gastroenterology

## 2024-08-18 DIAGNOSIS — N281 Cyst of kidney, acquired: Secondary | ICD-10-CM | POA: Diagnosis not present

## 2024-08-18 DIAGNOSIS — R7689 Other specified abnormal immunological findings in serum: Secondary | ICD-10-CM | POA: Diagnosis not present

## 2024-08-18 DIAGNOSIS — R748 Abnormal levels of other serum enzymes: Secondary | ICD-10-CM

## 2024-08-18 DIAGNOSIS — R935 Abnormal findings on diagnostic imaging of other abdominal regions, including retroperitoneum: Secondary | ICD-10-CM

## 2024-08-18 DIAGNOSIS — R160 Hepatomegaly, not elsewhere classified: Secondary | ICD-10-CM

## 2024-08-18 DIAGNOSIS — K838 Other specified diseases of biliary tract: Secondary | ICD-10-CM | POA: Diagnosis not present

## 2024-08-18 LAB — CBC WITH DIFFERENTIAL/PLATELET
Basophils Absolute: 0.1 K/uL (ref 0.0–0.1)
Basophils Relative: 1 % (ref 0.0–3.0)
Eosinophils Absolute: 0.1 K/uL (ref 0.0–0.7)
Eosinophils Relative: 2.1 % (ref 0.0–5.0)
HCT: 39.2 % (ref 36.0–46.0)
Hemoglobin: 13.1 g/dL (ref 12.0–15.0)
Lymphocytes Relative: 24.7 % (ref 12.0–46.0)
Lymphs Abs: 1.6 K/uL (ref 0.7–4.0)
MCHC: 33.5 g/dL (ref 30.0–36.0)
MCV: 89.3 fl (ref 78.0–100.0)
Monocytes Absolute: 0.6 K/uL (ref 0.1–1.0)
Monocytes Relative: 9.6 % (ref 3.0–12.0)
Neutro Abs: 4 K/uL (ref 1.4–7.7)
Neutrophils Relative %: 62.6 % (ref 43.0–77.0)
Platelets: 269 K/uL (ref 150.0–400.0)
RBC: 4.39 Mil/uL (ref 3.87–5.11)
RDW: 13.2 % (ref 11.5–15.5)
WBC: 6.4 K/uL (ref 4.0–10.5)

## 2024-08-18 LAB — HEPATIC FUNCTION PANEL
ALT: 52 U/L — ABNORMAL HIGH (ref 0–35)
AST: 30 U/L (ref 0–37)
Albumin: 4 g/dL (ref 3.5–5.2)
Alkaline Phosphatase: 156 U/L — ABNORMAL HIGH (ref 39–117)
Bilirubin, Direct: 0.3 mg/dL (ref 0.0–0.3)
Total Bilirubin: 0.8 mg/dL (ref 0.2–1.2)
Total Protein: 6.8 g/dL (ref 6.0–8.3)

## 2024-08-18 LAB — GAMMA GT: GGT: 84 U/L — ABNORMAL HIGH (ref 7–51)

## 2024-08-18 MED ORDER — GADOBUTROL 1 MMOL/ML IV SOLN
6.0000 mL | Freq: Once | INTRAVENOUS | Status: AC | PRN
  Administered 2024-08-18: 6 mL via INTRAVENOUS

## 2024-08-22 ENCOUNTER — Ambulatory Visit: Payer: Self-pay | Admitting: Gastroenterology

## 2024-08-22 NOTE — Progress Notes (Signed)
 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 Nl CEA. Alk phos/GGT elevated but trending down.  Plan: -Will have Dr Wilhelmenia review for possible ERCP +/- Spy/stent +/- EUS. -Await CA 19-9  RG  Gabe, Kindly review. Appreciate your help as always RG

## 2024-08-23 ENCOUNTER — Encounter: Admitting: Family Medicine

## 2024-08-23 ENCOUNTER — Other Ambulatory Visit: Payer: Self-pay

## 2024-08-23 DIAGNOSIS — K838 Other specified diseases of biliary tract: Secondary | ICD-10-CM

## 2024-08-23 DIAGNOSIS — R7689 Other specified abnormal immunological findings in serum: Secondary | ICD-10-CM

## 2024-08-23 DIAGNOSIS — R748 Abnormal levels of other serum enzymes: Secondary | ICD-10-CM

## 2024-08-23 LAB — CANCER ANTIGEN 19-9: CA 19-9: 67 U/mL — ABNORMAL HIGH (ref <34)

## 2024-08-23 LAB — CEA: CEA: <2 ng/mL

## 2024-08-24 ENCOUNTER — Other Ambulatory Visit: Payer: Self-pay

## 2024-08-24 DIAGNOSIS — E039 Hypothyroidism, unspecified: Secondary | ICD-10-CM | POA: Diagnosis not present

## 2024-08-25 ENCOUNTER — Ambulatory Visit: Payer: Self-pay | Admitting: Family Medicine

## 2024-08-25 LAB — TSH+FREE T4
Free T4: 1.04 ng/dL (ref 0.82–1.77)
TSH: 18.6 u[IU]/mL — ABNORMAL HIGH (ref 0.450–4.500)

## 2024-08-26 ENCOUNTER — Other Ambulatory Visit (INDEPENDENT_AMBULATORY_CARE_PROVIDER_SITE_OTHER)

## 2024-08-26 DIAGNOSIS — K838 Other specified diseases of biliary tract: Secondary | ICD-10-CM

## 2024-08-26 DIAGNOSIS — R748 Abnormal levels of other serum enzymes: Secondary | ICD-10-CM

## 2024-08-26 DIAGNOSIS — R7689 Other specified abnormal immunological findings in serum: Secondary | ICD-10-CM

## 2024-08-29 ENCOUNTER — Other Ambulatory Visit: Payer: Self-pay | Admitting: Family Medicine

## 2024-08-29 ENCOUNTER — Encounter: Payer: Self-pay | Admitting: Family Medicine

## 2024-08-29 DIAGNOSIS — E039 Hypothyroidism, unspecified: Secondary | ICD-10-CM

## 2024-08-29 LAB — IGG 4: IgG, Subclass 4: 55 mg/dL (ref 2–96)

## 2024-08-29 MED ORDER — LEVOTHYROXINE SODIUM 88 MCG PO TABS: 88.0000 ug | ORAL_TABLET | Freq: Every day | ORAL | 3 refills | Status: AC

## 2024-08-29 NOTE — Progress Notes (Signed)
 Advance Synthroid  dose to 88 mcg daily as her current medication is insufficient.  Uncertain if her rising thyroid  stimulating hormone and decreasing thyroid  level has anything to do with her current liver/bile duct issue but we discussed having her repeat her levels again in 6 weeks and that order has been placed and scheduled.  She will reach out to me prior to that time should any concerns arise  Orders Placed This Encounter  Procedures   TSH + free T4   Meds ordered this encounter  Medications   levothyroxine  (SYNTHROID ) 88 MCG tablet    Sig: Take 1 tablet (88 mcg total) by mouth daily. **new dose    Dispense:  90 tablet    Refill:  3

## 2024-08-30 ENCOUNTER — Ambulatory Visit: Admitting: Rheumatology

## 2024-08-30 DIAGNOSIS — C221 Intrahepatic bile duct carcinoma: Secondary | ICD-10-CM | POA: Diagnosis not present

## 2024-08-31 ENCOUNTER — Telehealth: Payer: Self-pay | Admitting: Gastroenterology

## 2024-08-31 NOTE — Telephone Encounter (Signed)
Noted FYI Dr Rush Landmark

## 2024-08-31 NOTE — Telephone Encounter (Signed)
 Pt stated that she seen Dr. Zamore a liver specialist from [facility] with [facility] that  came to [facility] office yesterday. Pt stated that Dr. Zamore stated that she did not need to wait until her scheduled 10/10/2024 ERCP. Pt stated that Dr. Zamore called [facility] and has pt scheduled next Wednesday for her ERCP.  Scheduling was called and ERCP with Dr. Wilhelmenia was canceled for 10/10/2024. Pt made aware.  Pt verbalized understanding with all questions answered.  Routed as FYI

## 2024-08-31 NOTE — Telephone Encounter (Signed)
 Inbound call from patient requesting a call from the nurse patient did not disclose any information, she said it was important. Please advise.

## 2024-08-31 NOTE — Telephone Encounter (Signed)
 Thanks for update. Happy they can accommodate. GM

## 2024-09-07 DIAGNOSIS — K831 Obstruction of bile duct: Secondary | ICD-10-CM | POA: Diagnosis not present

## 2024-09-07 DIAGNOSIS — R896 Abnormal cytological findings in specimens from other organs, systems and tissues: Secondary | ICD-10-CM | POA: Diagnosis not present

## 2024-09-07 DIAGNOSIS — K5710 Diverticulosis of small intestine without perforation or abscess without bleeding: Secondary | ICD-10-CM | POA: Diagnosis not present

## 2024-09-07 DIAGNOSIS — R7989 Other specified abnormal findings of blood chemistry: Secondary | ICD-10-CM | POA: Diagnosis not present

## 2024-09-07 DIAGNOSIS — K838 Other specified diseases of biliary tract: Secondary | ICD-10-CM | POA: Diagnosis not present

## 2024-09-07 DIAGNOSIS — R590 Localized enlarged lymph nodes: Secondary | ICD-10-CM | POA: Diagnosis not present

## 2024-09-16 ENCOUNTER — Telehealth: Admitting: Family Medicine

## 2024-09-16 ENCOUNTER — Ambulatory Visit: Payer: Self-pay

## 2024-09-16 DIAGNOSIS — L299 Pruritus, unspecified: Secondary | ICD-10-CM | POA: Diagnosis not present

## 2024-09-16 DIAGNOSIS — R978 Other abnormal tumor markers: Secondary | ICD-10-CM | POA: Diagnosis not present

## 2024-09-16 DIAGNOSIS — R7989 Other specified abnormal findings of blood chemistry: Secondary | ICD-10-CM | POA: Diagnosis not present

## 2024-09-16 DIAGNOSIS — R160 Hepatomegaly, not elsewhere classified: Secondary | ICD-10-CM | POA: Diagnosis not present

## 2024-09-16 DIAGNOSIS — K5710 Diverticulosis of small intestine without perforation or abscess without bleeding: Secondary | ICD-10-CM | POA: Diagnosis not present

## 2024-09-16 DIAGNOSIS — R590 Localized enlarged lymph nodes: Secondary | ICD-10-CM | POA: Diagnosis not present

## 2024-09-16 DIAGNOSIS — C240 Malignant neoplasm of extrahepatic bile duct: Secondary | ICD-10-CM | POA: Diagnosis not present

## 2024-09-16 MED ORDER — HYDROXYZINE HCL 10 MG PO TABS: 10.0000 mg | ORAL_TABLET | Freq: Three times a day (TID) | ORAL | 0 refills | Status: AC | PRN

## 2024-09-16 NOTE — Progress Notes (Signed)
 Virtual Visit Consent   [name], you are scheduled for a virtual visit with a [facility] provider today. Just as with appointments in the office, your consent must be obtained to participate. Your consent will be active for this visit and any virtual visit you may have with one of our providers in the next 365 days. If you have a MyChart account, a copy of this consent can be sent to you electronically.  As this is a virtual visit, video technology does not allow for your provider to perform a traditional examination. This may limit your provider's ability to fully assess your condition. If your provider identifies any concerns that need to be evaluated in person or the need to arrange testing (such as labs, EKG, etc.), we will make arrangements to do so. Although advances in technology are sophisticated, we cannot ensure that it will always work on either your end or our end. If the connection with a video visit is poor, the visit may have to be switched to a telephone visit. With either a video or telephone visit, we are not always able to ensure that we have a secure connection.  By engaging in this virtual visit, you consent to the provision of healthcare and authorize for your insurance to be billed (if applicable) for the services provided during this visit. Depending on your insurance coverage, you may receive a charge related to this service.  I need to obtain your verbal consent now. Are you willing to proceed with your visit today? [name] has provided verbal consent on 09/16/2024 for a virtual visit (video or telephone). [name], FNP  Date: 09/16/2024 4:38 PM   Virtual Visit via Video Note   I, [name], connected with  [name] [name]  ([id number], 15-Sep-1951) on 09/16/24 at  4:30 PM EST by a video-enabled telemedicine application and verified that I am speaking with the correct person using two identifiers.  Location: Patient: Virtual Visit Location Patient:  Home Provider: Virtual Visit Location Provider: Home Office   I discussed the limitations of evaluation and management by telemedicine and the availability of in person appointments. The patient expressed understanding and agreed to proceed.    History of Present Illness: [name] [name] is a 73 y.o. who identifies as a female who was assigned female at birth, and is being seen today for itching off and on since April. She has bile duct cancer. ERCP last wed and placed a stent in bile duct. They told her itching would stop after that. Her pharmacist recommended hydroxyzine . LFT mildly elevated. She has labs checked today but results are not in. She is followed with GI with upcoming appmt with oncology.   HPI: HPI  Problems:  Patient Active Problem List   Diagnosis Date Noted   Proximal phalanx fracture of finger 11/28/2021   Postmenopausal 11/22/2019   Vitamin D  deficiency 11/22/2019   Depression, recurrent 11/22/2019   SVT (supraventricular tachycardia) 05/03/2019   Precordial chest pain 08/22/2018   Insomnia 10/02/2015   Hypothyroidism (acquired) 02/03/2013   Hyperlipidemia 02/03/2013   Osteopenia of multiple sites 02/03/2013   Rosacea 02/03/2013   HSV-1 (herpes simplex virus 1) infection 02/03/2013    Allergies:  Allergies  Allergen Reactions   Bactrim  [Sulfamethoxazole -Trimethoprim ] Nausea Only    Nausea and fatigue   Medications:  Current Outpatient Medications:    azelastine  (ASTELIN ) 0.1 % nasal spray, Place 1 spray into both nostrils 2 (two) times daily. Use in each nostril as directed, Disp: 30 mL, Rfl:  12   Calcium  Carbonate-Vitamin D  (CALCIUM  500/D PO), Take by mouth., Disp: , Rfl:    clobetasol  ointment (TEMOVATE ) 0.05 %, Apply to affected areas on external vagina nightly as needed for lichen sclerosis, Disp: 45 g, Rfl: 1   escitalopram  (LEXAPRO ) 10 MG tablet, Take 1 tablet (10 mg total) by mouth daily., Disp: 90 tablet, Rfl: 3   famotidine  (PEPCID ) 20 MG tablet, Take 1  tablet (20 mg total) by mouth 2 (two) times daily as needed for heartburn or indigestion., Disp: 180 tablet, Rfl: 3   levothyroxine  (SYNTHROID ) 88 MCG tablet, Take 1 tablet (88 mcg total) by mouth daily. **new dose, Disp: 90 tablet, Rfl: 3   Multiple Vitamins-Minerals (PRESERVISION AREDS 2 PO), Take by mouth. (Patient not taking: Reported on 07/12/2024), Disp: , Rfl:    ursodiol  (ACTIGALL ) 250 MG tablet, TAKE 2 TABLETS BY MOUTH 2 TIMES DAILY., Disp: 120 tablet, Rfl: 3   VITAMIN D  PO, Take 5,000 Units by mouth daily., Disp: , Rfl:   Observations/Objective: Patient is well-developed, well-nourished in no acute distress.  Resting comfortably  at home.  Head is normocephalic, atraumatic.  No labored breathing.  Speech is clear and coherent with logical content.  Patient is alert and oriented at baseline.    Assessment and Plan: 1. Bile duct cancer (HCC) (Primary)  2. Pruritic disorder  She will contact GI to approve taking this.   Follow Up Instructions: I discussed the assessment and treatment plan with the patient. The patient was provided an opportunity to ask questions and all were answered. The patient agreed with the plan and demonstrated an understanding of the instructions.  A copy of instructions were sent to the patient via MyChart unless otherwise noted below.     The patient was advised to call back or seek an in-person evaluation if the symptoms worsen or if the condition fails to improve as anticipated.    [name], FNP

## 2024-09-16 NOTE — Patient Instructions (Signed)
 Pruritus Pruritus is an itchy feeling on the skin. One of the most common causes is dry skin, but many different things can cause itching. Most cases of itching do not require medical attention. Sometimes itchy skin can turn into a rash or a secondary infection. Follow these instructions at home: Skin care  Do not use scented soaps, detergents, perfumes, and cosmetic products. Instead, use gentle, unscented versions of these items. Apply moisturizing creams to your skin frequently, at least twice daily. Apply immediately after bathing while skin is still wet. Take medicines or apply medicated creams only as told by your health care provider. This may include: Corticosteroid cream or topical calcineurin inhibitor. Anti-itch lotions containing urea, camphor, or menthol. Oral antihistamines. Do not take hot showers or baths, which can make itching worse. A short, cool shower may help with itching as long as you apply moisturizing lotion after the shower. Apply a cool, wet cloth (cool compress) to the affected areas. You may take lukewarm baths with one of the following: Epsom salts. You can get these at your local pharmacy or grocery store. Follow the instructions on the packaging. Baking soda. Pour a small amount into the bath as told by your health care provider. Colloidal oatmeal. You can get this at your local pharmacy or grocery store. Follow the instructions on the packaging. Do not scratch your skin. General instructions Avoid wearing tight clothes. Keep a journal to help find out what is causing your itching. Write down: What you eat and drink. What cosmetic products you use. What soaps or detergents you use. What you wear, including jewelry. Use a humidifier. This keeps the air moist, which helps to prevent dry skin. Be aware of any changes in your itchiness. Tell your health care provider about any changes. Contact a health care provider if: The itching does not go away after  several days. You notice redness, warmth, or drainage on the skin where you have scratched. You are unusually thirsty or urinating more than normal. Your skin tingles or feels numb. Your skin or the white parts of your eyes turn yellow (jaundice). You feel weak. You have any of the following: Night sweats. Tiredness (fatigue). Weight loss. Abdominal pain. Summary Pruritus is an itchy feeling on the skin. One of the most common causes is dry skin, but many different conditions and factors can cause itching. Apply moisturizing creams to your skin frequently, at least twice daily. Apply immediately after bathing while skin is still wet. Take medicines or apply medicated creams only as told by your health care provider. Do not take hot showers or baths. Do not use scented soaps, detergents, perfumes, or cosmetic products. Keep a journal to help find out what is causing your itching. This information is not intended to replace advice given to you by your health care provider. Make sure you discuss any questions you have with your health care provider. Document Revised: 11/06/2021 Document Reviewed: 11/06/2021 Elsevier Patient Education  2024 [employer].

## 2024-09-16 NOTE — Telephone Encounter (Signed)
 FYI Only or Action Required?: FYI only for provider: UC appt for today.  Patient was last seen in primary care on 07/12/2024 by Jolinda Norene HERO, DO.  Called Nurse Triage reporting Pruritis.  Symptoms began a week ago.  Interventions attempted: OTC medications: benadryl, cortisone.  Symptoms are: gradually worsening.  Triage Disposition: See Physician Within 24 Hours  Patient/caregiver understands and will follow disposition?: Yes  Copied from CRM #[id number]. Topic: Clinical - Red Word Triage >> Sep 16, 2024 11:27 AM Wess RAMAN wrote: Red Word that prompted transfer to Nurse Triage: Bile duct is causing extreme itching. She would like Hydroxyzine  sent to pharmacy  Pharmacy: [phone] - [place], [place] - [address] [place] [address] Phone: [phone] Fax: [phone] Hours: Not open 24 hours Reason for Disposition  [1] MODERATE-SEVERE widespread itching (i.e., interferes with sleep, normal activities or school) AND [2] not improved after 24 hours of itching Care Advice  Answer Assessment - Initial Assessment Questions Has notified digestive specialist that performed procedure.  She has used cortisone and oral benadryl as recommended without relief.    1. DESCRIPTION: Describe the itching you are having.     Describes as 24-7 head to toe itching, like she is itching from the inside 2. SEVERITY: How bad is it?      severe 3. SCRATCHING: Are there any scratch marks? Bleeding?     Yes, admits sores from itching 4. ONSET: When did this begin? (e.g., minutes, hours, days ago)      One weeks ago 5. CAUSE: What do you think is causing the itching? (ask about swimming pools, pollen, animals, soaps, etc.)     Bile duct cancer.  Recently had stent placed and was told that this would take care of all of her symptoms.  6. OTHER SYMPTOMS: Do you have any other symptoms? (e.g., fever, rash)     Denies fever or rash  Protocols used:  Itching - [facility]

## 2024-09-16 NOTE — Telephone Encounter (Signed)
 Patient has appointment.

## 2024-09-20 ENCOUNTER — Ambulatory Visit: Admitting: Gastroenterology

## 2024-09-22 DIAGNOSIS — C221 Intrahepatic bile duct carcinoma: Secondary | ICD-10-CM | POA: Diagnosis not present

## 2024-09-25 ENCOUNTER — Other Ambulatory Visit: Payer: Self-pay | Admitting: Gastroenterology

## 2024-09-25 DIAGNOSIS — R748 Abnormal levels of other serum enzymes: Secondary | ICD-10-CM

## 2024-09-25 DIAGNOSIS — R7689 Other specified abnormal immunological findings in serum: Secondary | ICD-10-CM

## 2024-09-29 ENCOUNTER — Encounter (INDEPENDENT_AMBULATORY_CARE_PROVIDER_SITE_OTHER): Payer: HMO | Admitting: Ophthalmology

## 2024-09-29 DIAGNOSIS — H35372 Puckering of macula, left eye: Secondary | ICD-10-CM | POA: Diagnosis not present

## 2024-09-29 DIAGNOSIS — H43813 Vitreous degeneration, bilateral: Secondary | ICD-10-CM

## 2024-09-29 DIAGNOSIS — H33303 Unspecified retinal break, bilateral: Secondary | ICD-10-CM | POA: Diagnosis not present

## 2024-09-30 ENCOUNTER — Telehealth: Payer: Self-pay | Admitting: Family Medicine

## 2024-09-30 NOTE — Telephone Encounter (Signed)
 Absolutely welcome to add that A1c.  She is not a diabetic nor has she had any recent elevations in glucose on metabolic panel but I see no reason that she cannot have it as an elective lab.  Would maybe put screening for diabetes as the indication for test

## 2024-09-30 NOTE — Telephone Encounter (Unsigned)
 Copied from CRM #[id number]. Topic: Clinical - Request for Lab/Test Order >> Sep 30, 2024 10:23 AM [name] [name] wrote: Reason for CRM: [name] [name] called in stating she would like to add glucose test to lab order

## 2024-09-30 NOTE — Telephone Encounter (Signed)
 Patient is scheduled for lab appt on Monday, Dec 22nd to recheck thyroid  level. She is requesting to have A1C checked too. Okay to add order?

## 2024-10-03 ENCOUNTER — Other Ambulatory Visit

## 2024-10-03 ENCOUNTER — Other Ambulatory Visit: Payer: Self-pay | Admitting: Family Medicine

## 2024-10-03 DIAGNOSIS — Z131 Encounter for screening for diabetes mellitus: Secondary | ICD-10-CM

## 2024-10-03 DIAGNOSIS — E039 Hypothyroidism, unspecified: Secondary | ICD-10-CM

## 2024-10-03 LAB — BAYER DCA HB A1C WAIVED: HB A1C (BAYER DCA - WAIVED): 5.4 % (ref 4.8–5.6)

## 2024-10-04 ENCOUNTER — Other Ambulatory Visit: Payer: Self-pay | Admitting: Family Medicine

## 2024-10-04 ENCOUNTER — Ambulatory Visit: Payer: Self-pay | Admitting: Family Medicine

## 2024-10-04 DIAGNOSIS — E039 Hypothyroidism, unspecified: Secondary | ICD-10-CM

## 2024-10-04 LAB — TSH+FREE T4
Free T4: 1.33 ng/dL (ref 0.82–1.77)
TSH: 14.2 u[IU]/mL — ABNORMAL HIGH (ref 0.450–4.500)

## 2024-10-04 NOTE — Telephone Encounter (Signed)
 Labs collected.

## 2024-10-10 ENCOUNTER — Encounter (HOSPITAL_COMMUNITY): Payer: Self-pay

## 2024-10-10 ENCOUNTER — Telehealth: Payer: Self-pay | Admitting: Gastroenterology

## 2024-10-10 ENCOUNTER — Ambulatory Visit (HOSPITAL_COMMUNITY): Admit: 2024-10-10 | Admitting: Gastroenterology

## 2024-10-10 SURGERY — ERCP, WITH INTERVENTION IF INDICATED
Anesthesia: General

## 2024-10-10 NOTE — Telephone Encounter (Signed)
 Inbound call from patient stating that she is no longer with our practice and she just received a notification in regards to someone refill a medication which she is no longer taking call ursodiol  250 MG. Please advise

## 2024-10-11 NOTE — Telephone Encounter (Signed)
 Spoke with pt spouse who stated that he picked up a medication yesterday that the pt is no longer taking. Spouse stated that she is being treated by a different provider now.

## 2024-11-09 ENCOUNTER — Telehealth: Payer: Self-pay

## 2024-11-09 NOTE — Telephone Encounter (Signed)
 Copied from CRM [id number]. Topic: Clinical - Medication Question >> Nov 09, 2024  9:30 AM Graeme ORN wrote: Reason for CRM: Patient called. Request to speak to nurse about thyroid . Wants to make sure she is taking it correctly. Is confused about it and would like a call back from nurse. Thank You

## 2024-11-09 NOTE — Telephone Encounter (Signed)
 Pt aware she should take 1.5 tablets on Saturday and Sunday and 1 tablet the rest of the days per Dr KANDICE last result note.

## 2024-11-18 ENCOUNTER — Telehealth: Payer: Self-pay

## 2024-11-18 NOTE — Telephone Encounter (Signed)
 Reached out to pharmacy, pharmacy tech confirmed that they do have the new prescription for Levothyroxine  and it will be ready for pick up this afternoon. Patient understands and will pick up prescription this afternoon.

## 2024-11-18 NOTE — Telephone Encounter (Signed)
 Copied from CRM #[id number]. Topic: Clinical - Prescription Issue >> Nov 18, 2024  9:01 AM Susanna ORN wrote: Reason for CRM: Patient called stating that Dr. Jolinda changed her thyroid  medication, levothyroxine  (SYNTHROID ) 88 MCG tablet. Now the pharmacy will not fill it & patient will run out. Pharmacy has old prescription & will need a new prescription with the added pills that Dr. Jolinda added. Patient states they will not fill it until the 13th and she will run out before then.

## 2024-12-12 ENCOUNTER — Ambulatory Visit: Payer: Self-pay | Admitting: Family Medicine

## 2024-12-12 ENCOUNTER — Other Ambulatory Visit: Payer: Self-pay

## 2025-03-08 ENCOUNTER — Ambulatory Visit: Admitting: "Endocrinology

## 2025-04-21 ENCOUNTER — Ambulatory Visit: Admitting: Rheumatology

## 2025-05-29 ENCOUNTER — Ambulatory Visit

## 2025-05-30 ENCOUNTER — Ambulatory Visit: Payer: Self-pay

## 2025-07-17 ENCOUNTER — Encounter: Payer: Self-pay | Admitting: Family Medicine

## 2025-09-29 ENCOUNTER — Encounter (INDEPENDENT_AMBULATORY_CARE_PROVIDER_SITE_OTHER): Admitting: Ophthalmology
